# Patient Record
Sex: Male | Born: 1935 | Race: White | Hispanic: No | Smoking: Former smoker
Health system: Southern US, Community
[De-identification: ages and names within clinical notes are randomized; demographics above are authoritative.]

## PROBLEM LIST (undated history)

## (undated) DIAGNOSIS — E785 Hyperlipidemia, unspecified: Secondary | ICD-10-CM

## (undated) DIAGNOSIS — I2510 Atherosclerotic heart disease of native coronary artery without angina pectoris: Secondary | ICD-10-CM

## (undated) DIAGNOSIS — I639 Cerebral infarction, unspecified: Secondary | ICD-10-CM

## (undated) DIAGNOSIS — I10 Essential (primary) hypertension: Secondary | ICD-10-CM

## (undated) DIAGNOSIS — Z951 Presence of aortocoronary bypass graft: Secondary | ICD-10-CM

## (undated) HISTORY — DX: Cerebral infarction, unspecified: I63.9

## (undated) HISTORY — DX: Essential (primary) hypertension: I10

## (undated) HISTORY — DX: Hyperlipidemia, unspecified: E78.5

## (undated) HISTORY — DX: Presence of aortocoronary bypass graft: Z95.1

## (undated) HISTORY — DX: Atherosclerotic heart disease of native coronary artery without angina pectoris: I25.10

---

## 1996-09-19 DIAGNOSIS — Z951 Presence of aortocoronary bypass graft: Secondary | ICD-10-CM

## 1996-09-19 HISTORY — PX: CHOLECYSTECTOMY: SHX55

## 1996-09-19 HISTORY — DX: Presence of aortocoronary bypass graft: Z95.1

## 1997-07-08 HISTORY — PX: CORONARY ARTERY BYPASS GRAFT: SHX141

## 1998-03-30 ENCOUNTER — Ambulatory Visit (HOSPITAL_COMMUNITY): Admission: RE | Admit: 1998-03-30 | Discharge: 1998-03-30 | Payer: Self-pay | Admitting: Cardiology

## 2007-02-09 ENCOUNTER — Emergency Department (HOSPITAL_COMMUNITY): Admission: EM | Admit: 2007-02-09 | Discharge: 2007-02-09 | Payer: Self-pay | Admitting: Emergency Medicine

## 2012-11-22 ENCOUNTER — Other Ambulatory Visit (HOSPITAL_COMMUNITY): Payer: Self-pay | Admitting: Internal Medicine

## 2012-11-22 DIAGNOSIS — R9430 Abnormal result of cardiovascular function study, unspecified: Secondary | ICD-10-CM

## 2012-11-29 ENCOUNTER — Ambulatory Visit (HOSPITAL_COMMUNITY)
Admission: RE | Admit: 2012-11-29 | Discharge: 2012-11-29 | Disposition: A | Discharge status: Home / Self Care | Payer: BC Managed Care – PPO | Source: Ambulatory Visit | Attending: Internal Medicine | Admitting: Internal Medicine

## 2012-11-29 DIAGNOSIS — IMO0002 Reserved for concepts with insufficient information to code with codable children: Secondary | ICD-10-CM

## 2012-11-29 DIAGNOSIS — R0600 Dyspnea, unspecified: Secondary | ICD-10-CM

## 2012-11-29 DIAGNOSIS — Z951 Presence of aortocoronary bypass graft: Secondary | ICD-10-CM

## 2012-11-29 DIAGNOSIS — R0609 Other forms of dyspnea: Secondary | ICD-10-CM | POA: Insufficient documentation

## 2012-11-29 DIAGNOSIS — R0989 Other specified symptoms and signs involving the circulatory and respiratory systems: Secondary | ICD-10-CM | POA: Insufficient documentation

## 2012-11-29 HISTORY — PX: TRANSTHORACIC ECHOCARDIOGRAM: SHX275

## 2012-11-29 NOTE — Progress Notes (Signed)
2D Echo Performed 11/29/2012    Tammie Crouch, RCS  

## 2013-02-01 ENCOUNTER — Other Ambulatory Visit: Payer: Self-pay | Admitting: *Deleted

## 2013-02-01 MED ORDER — AMLODIPINE BESY-BENAZEPRIL HCL 5-20 MG PO CAPS: 1.0000 | ORAL_CAPSULE | Freq: Every day | ORAL | Status: DC

## 2013-02-01 NOTE — Telephone Encounter (Signed)
rx sent to pharmacy by e-script Pt last seen 12/10/12

## 2013-02-27 ENCOUNTER — Other Ambulatory Visit: Payer: Self-pay | Admitting: *Deleted

## 2013-02-28 ENCOUNTER — Other Ambulatory Visit: Payer: Self-pay

## 2013-02-28 MED ORDER — AMLODIPINE BESY-BENAZEPRIL HCL 5-20 MG PO CAPS: 1.0000 | ORAL_CAPSULE | Freq: Every day | ORAL | Status: DC

## 2013-02-28 NOTE — Telephone Encounter (Signed)
Rx was sent to pharmacy electronically. 

## 2013-09-28 ENCOUNTER — Other Ambulatory Visit: Payer: Self-pay | Admitting: Internal Medicine

## 2013-10-01 NOTE — Telephone Encounter (Signed)
Rx was sent to pharmacy electronically. 

## 2013-10-22 ENCOUNTER — Telehealth: Payer: Self-pay | Admitting: Internal Medicine

## 2013-10-22 NOTE — Telephone Encounter (Signed)
Pt wants to know if he needs blood test prior to his appt with dr. Rennis Goldenhilty in march?

## 2013-11-15 ENCOUNTER — Encounter: Payer: Self-pay | Admitting: *Deleted

## 2013-11-18 ENCOUNTER — Encounter: Payer: Self-pay | Admitting: Internal Medicine

## 2013-11-18 ENCOUNTER — Ambulatory Visit (INDEPENDENT_AMBULATORY_CARE_PROVIDER_SITE_OTHER): Payer: BC Managed Care – PPO | Admitting: Internal Medicine

## 2013-11-18 ENCOUNTER — Other Ambulatory Visit: Payer: Self-pay | Admitting: Internal Medicine

## 2013-11-18 VITALS — BP 110/64 | HR 77 | Ht 72.0 in | Wt 210.7 lb

## 2013-11-18 DIAGNOSIS — N183 Chronic kidney disease, stage 3 unspecified: Secondary | ICD-10-CM

## 2013-11-18 DIAGNOSIS — I255 Ischemic cardiomyopathy: Secondary | ICD-10-CM

## 2013-11-18 DIAGNOSIS — Z951 Presence of aortocoronary bypass graft: Secondary | ICD-10-CM

## 2013-11-18 DIAGNOSIS — I2510 Atherosclerotic heart disease of native coronary artery without angina pectoris: Secondary | ICD-10-CM

## 2013-11-18 DIAGNOSIS — I2589 Other forms of chronic ischemic heart disease: Secondary | ICD-10-CM

## 2013-11-18 DIAGNOSIS — N1832 Chronic kidney disease, stage 3b: Secondary | ICD-10-CM | POA: Insufficient documentation

## 2013-11-18 DIAGNOSIS — R7301 Impaired fasting glucose: Secondary | ICD-10-CM

## 2013-11-18 DIAGNOSIS — E785 Hyperlipidemia, unspecified: Secondary | ICD-10-CM

## 2013-11-18 MED ORDER — AMLODIPINE BESY-BENAZEPRIL HCL 5-20 MG PO CAPS: ORAL_CAPSULE | ORAL | Status: DC

## 2013-11-18 MED ORDER — METOPROLOL SUCCINATE ER 25 MG PO TB24: 12.5000 mg | ORAL_TABLET | Freq: Every day | ORAL | Status: DC

## 2013-11-18 MED ORDER — ATORVASTATIN CALCIUM 20 MG PO TABS: 20.0000 mg | ORAL_TABLET | Freq: Every day | ORAL | Status: DC

## 2013-11-18 NOTE — Telephone Encounter (Signed)
Medication were order early

## 2013-11-18 NOTE — Progress Notes (Signed)
OFFICE NOTE  Chief Complaint:  No complaints  Primary Care Physician: Tally DueGUEST, CHRIS WARREN, MD  HPI:  Patrick Giles is a 80 year old gentleman, previously followed by Dr. Clarene DukeLittle, with history of CABG in 1998 with a LIMA to LAD, saphenous vein graft to PDA, and vein graft to first diagonal. In 2006, he had a stress test, which showed an EF of about 43% to 44% with a fixed inferoapical defect. That was again present in 2010, suggesting he may have lost a vein graft, possibly to the PDA, and has a fixed defect which looks like scar. The recent, repeat echo in 2014 showed an EF of 45-50% with inferolateral hypokinesis. He is able to walk without any shortness of breath or worsening symptoms. He is on an ACE inhibitor and a B-blocker was added due to CAD and HTN.  He has tolerated this well.  He recently had a lipid profile showing total cholesterol 161, HDL 51, triglycerides 126 and LDL 96. Fasting glucose was 119. Creatinine is stable at 1.5.  PMHx:  Past Medical History  Diagnosis Date  . CAD (coronary artery disease)   . S/P CABG (coronary artery bypass graft) 1998    LIMA to LAD, VG to PDA, VG to first diagonal  . Hypertension   . CKD (chronic kidney disease)   . Dyslipidemia   . History of ischemic cardiomyopathy     Past Surgical History  Procedure Laterality Date  . Coronary artery bypass graft  07/08/1997    LIMA to LAD, reverse SVG to PDA, reverse SVG to first diagonal (Dr. Wynetta FinesE. Gerhardt)  . Cholecystectomy  1998  . Transthoracic echocardiogram  11/29/2012    EF 45-50%, grade 1 diastolic dysfunction  . Nm myocar perf wall motion  11/2008    bruce myoview; stress images show modeate in intensity perfusion defect in apical anterior, apical setpal, apical & septal walls on stress, resting images with no significant defect reversibility - abnormal, but low risk scan    FAMHx:  Family History  Problem Relation Age of Onset  . Emphysema Mother   . Colon cancer Father   . Lung  cancer Brother     SOCHx:   reports that he has never smoked. He has never used smokeless tobacco. He reports that he does not drink alcohol or use illicit drugs.  ALLERGIES:  No Known Allergies  ROS: A comprehensive review of systems was negative.  HOME MEDS: Current Outpatient Prescriptions  Medication Sig Dispense Refill  . amLODipine-benazepril (LOTREL) 5-20 MG per capsule TAKE 1 CAPSULE BY MOUTH DAILY.  30 capsule  11  . aspirin 81 MG tablet Take 81 mg by mouth daily.      Marland Kitchen. atorvastatin (LIPITOR) 20 MG tablet Take 1 tablet (20 mg total) by mouth daily.  30 tablet  11  . BEE POLLEN PO Take 550 mg by mouth daily.       Marland Kitchen. GARLIC PO Take 1,6101,000 mg by mouth daily.       . metoprolol succinate (TOPROL-XL) 25 MG 24 hr tablet Take 0.5 tablets (12.5 mg total) by mouth daily.  15 tablet  11  . Multiple Vitamins-Minerals (ALIVE MENS ENERGY PO) Take by mouth daily.      . Omega-3 Fatty Acids (FISH OIL) 1200 MG CAPS Take 2 capsules by mouth daily.       No current facility-administered medications for this visit.    LABS/IMAGING: No results found for this or any previous visit (from the past  48 hour(s)). No results found.  VITALS: BP 110/64  Pulse 77  Ht 6' (1.829 m)  Wt 210 lb 11.2 oz (95.573 kg)  BMI 28.57 kg/m2  EXAM: General appearance: alert and no distress Neck: no carotid bruit and no JVD Lungs: clear to auscultation bilaterally Heart: regular rate and rhythm, S1, S2 normal, no murmur, click, rub or gallop Abdomen: soft, non-tender; bowel sounds normal; no masses,  no organomegaly Extremities: extremities normal, atraumatic, no cyanosis or edema Pulses: 2+ and symmetric Skin: Skin color, texture, turgor normal. No rashes or lesions Neurologic: Grossly normal Psych: Mood, affect normal  EKG: Normal sinus rhythm at 77  ASSESSMENT: 1. Coronary artery disease status post three-vessel CABG in 1998 2. Ischemic cardio myopathy EF 45-50%, with inferolateral  hypokinesis 3. Hypertension-controlled 4. Dyslipidemia-at goal 5. CKD 3  PLAN: 1.   Mr. Shillingford is doing fairly well and is on a good medical regimen.  Blood pressure remains well controlled and she is cholesterol is at goal. Creatinine is stable at 1.5. He does have apparent fasting glucose and a metabolic syndrome profile. He needs to continue to work on reduction of abdominal obesity with exercise and dietary changes. Plan to see him back annually or sooner as necessary.  Chrystie Nose, MD, [facility] Attending Cardiologist [facility]  Roston Grunewald C 11/18/2013, 4:50 PM

## 2013-11-18 NOTE — Patient Instructions (Signed)
Your physician wants you to follow-up in: 1 year. You will receive a reminder letter in the mail two months in advance. If you don't receive a letter, please call our office to schedule the follow-up appointment.  

## 2013-11-19 ENCOUNTER — Encounter: Payer: Self-pay | Admitting: Internal Medicine

## 2014-01-25 ENCOUNTER — Other Ambulatory Visit: Payer: Self-pay | Admitting: Internal Medicine

## 2014-01-27 NOTE — Telephone Encounter (Signed)
Refill request refused - already refilled 11/18/13

## 2014-05-13 ENCOUNTER — Ambulatory Visit (INDEPENDENT_AMBULATORY_CARE_PROVIDER_SITE_OTHER): Payer: BC Managed Care – PPO | Admitting: Family Medicine

## 2014-05-13 VITALS — BP 120/72 | HR 62 | Temp 98.6°F | Resp 18 | Ht 69.0 in | Wt 208.0 lb

## 2014-05-13 DIAGNOSIS — Z1211 Encounter for screening for malignant neoplasm of colon: Secondary | ICD-10-CM

## 2014-05-13 DIAGNOSIS — R82998 Other abnormal findings in urine: Secondary | ICD-10-CM

## 2014-05-13 DIAGNOSIS — Z125 Encounter for screening for malignant neoplasm of prostate: Secondary | ICD-10-CM

## 2014-05-13 DIAGNOSIS — R319 Hematuria, unspecified: Secondary | ICD-10-CM

## 2014-05-13 LAB — POCT CBC
GRANULOCYTE PERCENT: 67.8 % (ref 37–80)
HEMATOCRIT: 45.7 % (ref 43.5–53.7)
Hemoglobin: 14.6 g/dL (ref 14.1–18.1)
Lymph, poc: 2 (ref 0.6–3.4)
MCH, POC: 29.3 pg (ref 27–31.2)
MCHC: 32 g/dL (ref 31.8–35.4)
MCV: 91.4 fL (ref 80–97)
MID (cbc): 0.8 (ref 0–0.9)
MPV: 8.2 fL (ref 0–99.8)
PLATELET COUNT, POC: 218 10*3/uL (ref 142–424)
POC Granulocyte: 5.9 (ref 2–6.9)
POC LYMPH PERCENT: 23.3 %L (ref 10–50)
POC MID %: 8.9 %M (ref 0–12)
RBC: 5 M/uL (ref 4.69–6.13)
RDW, POC: 14.2 %
WBC: 8.7 10*3/uL (ref 4.6–10.2)

## 2014-05-13 LAB — POCT URINALYSIS DIPSTICK
Glucose, UA: NEGATIVE
Ketones, UA: 15
Nitrite, UA: NEGATIVE
Protein, UA: >=300
Spec Grav, UA: 1.015
Urobilinogen, UA: 4
pH, UA: 5.5

## 2014-05-13 LAB — POCT UA - MICROSCOPIC ONLY

## 2014-05-13 LAB — IFOBT (OCCULT BLOOD): IMMUNOLOGICAL FECAL OCCULT BLOOD TEST: POSITIVE

## 2014-05-13 NOTE — Patient Instructions (Signed)
We are going to refer you to a urologist so they can look at your bladder.  You will get a phone call about your appt with [facility].  I will contact you with your lab results as soon as they are available.   If you have not heard from me in 2 weeks, please contact me.  The fastest way to get your results is to register for My Chart (see the instructions on the last page of this printout).

## 2014-05-13 NOTE — Progress Notes (Signed)
Subjective:    Patient ID: Patrick Giles, male    DOB: [birth year], 80 y.o.   MRN: [id number]  HPI Pt presents to clinic with blood in his urine for the last 3 days.  It is not changing.  He feels fine.  His urine stream is weak but that is not a change for him.  He urinates about every 2 hrs but that is also not abnormal for him.  He is having no abd pain or pressure and having no dysuria.  He has not had a change in his sexual partner. He is having no back pain other than slightly sore from playing golf the last 2 days.  He has never noticed blood in his urine before.  He otherwise feels fine.   He had a normal PSA in 3/15 through his work.  He has not had a prostate exam in the last several years.  He has never been a smoker.  Review of Systems  Constitutional: Negative for fever and chills.  Genitourinary: Positive for frequency (he urinates every 2 hrs which is normal for him), hematuria and difficulty urinating (no trouble getting stream started but it is weak). Negative for dysuria, urgency and decreased urine volume.  Musculoskeletal: Negative for back pain.       Objective:   Physical Exam  Vitals reviewed. Constitutional: He is oriented to person, place, and time. He appears well-developed and well-nourished.  HENT:  Head: Normocephalic and atraumatic.  Right Ear: External ear normal.  Left Ear: External ear normal.  Cardiovascular: Normal rate, regular rhythm and normal heart sounds.   No murmur heard. Pulmonary/Chest: Effort normal and breath sounds normal. He has no wheezes.  Abdominal: Soft. Bowel sounds are normal. There is no tenderness.  Genitourinary: Guaiac negative stool. Prostate is enlarged. Prostate is not tender.  Neurological: He is alert and oriented to person, place, and time.  Skin: Skin is warm and dry.  Psychiatric: He has a normal mood and affect. His behavior is normal. Judgment and thought content normal.   Results for orders placed in visit on  05/13/14  POCT URINALYSIS DIPSTICK      Result Value Ref Range   Color, UA dark red     Clarity, UA turbid     Glucose, UA neg     Bilirubin, UA large     Ketones, UA 15     Spec Grav, UA 1.015     Blood, UA large     pH, UA 5.5     Protein, UA >=300     Urobilinogen, UA 4.0     Nitrite, UA neg     Leukocytes, UA large (3+)    POCT UA - MICROSCOPIC ONLY      Result Value Ref Range   WBC, Ur, HPF, POC       RBC, urine, microscopic TNTC     Bacteria, U Microscopic       Mucus, UA       Epithelial cells, urine per micros       Crystals, Ur, HPF, POC       Casts, Ur, LPF, POC       Yeast, UA      IFOBT (OCCULT BLOOD)      Result Value Ref Range   IFOBT Negative    POCT CBC      Result Value Ref Range   WBC 8.7  4.6 - 10.2 K/uL   Lymph, poc 2.0  0.6 - 3.4  POC LYMPH PERCENT 23.3  10 - 50 %L   MID (cbc) 0.8  0 - 0.9   POC MID % 8.9  0 - 12 %M   POC Granulocyte 5.9  2 - 6.9   Granulocyte percent 67.8  37 - 80 %G   RBC 5.00  4.69 - 6.13 M/uL   Hemoglobin 14.6  14.1 - 18.1 g/dL   HCT, POC 13.0  86.5 - 53.7 %   MCV 91.4  80 - 97 fL   MCH, POC 29.3  27 - 31.2 pg   MCHC 32.0  31.8 - 35.4 g/dL   RDW, POC 78.4     Platelet Count, POC 218  142 - 424 K/uL   MPV 8.2  0 - 99.8 fL       Assessment & Plan:  Blood in urine - Plan: POCT urinalysis dipstick, POCT UA - Microscopic Only, POCT CBC  Hematuria - Plan: Ambulatory referral to Urology  Screening for prostate cancer - Plan: PSA  Screening for colon cancer - Plan: IFOBT POC (occult bld, rslt in office)  Urine white blood cells increased - Plan: Urine culture  D/w Dr Arva Chafe PA-C  [facility] 05/13/2014 10:32 AM

## 2014-05-14 LAB — URINE CULTURE
Colony Count: NO GROWTH
ORGANISM ID, BACTERIA: NO GROWTH

## 2014-05-14 LAB — PSA: PSA: 7.17 ng/mL — ABNORMAL HIGH (ref <=4.00)

## 2014-05-15 NOTE — Progress Notes (Signed)
Reviewed documentation and agree w/ assessment and plan. Eva Shaw, MD MPH 

## 2014-07-23 ENCOUNTER — Observation Stay (HOSPITAL_COMMUNITY)
Admission: EM | Admit: 2014-07-23 | Discharge: 2014-07-24 | Disposition: A | Discharge status: Home / Self Care | Payer: BC Managed Care – PPO | Attending: Family Medicine | Admitting: Family Medicine

## 2014-07-23 ENCOUNTER — Ambulatory Visit (HOSPITAL_COMMUNITY)
Admission: RE | Admit: 2014-07-23 | Discharge: 2014-07-23 | Disposition: A | Discharge status: Home / Self Care | Payer: BC Managed Care – PPO | Source: Ambulatory Visit | Attending: Family Medicine | Admitting: Family Medicine

## 2014-07-23 ENCOUNTER — Telehealth: Payer: Self-pay | Admitting: *Deleted

## 2014-07-23 ENCOUNTER — Encounter (HOSPITAL_COMMUNITY): Payer: Self-pay | Admitting: *Deleted

## 2014-07-23 ENCOUNTER — Ambulatory Visit (INDEPENDENT_AMBULATORY_CARE_PROVIDER_SITE_OTHER): Payer: BC Managed Care – PPO | Admitting: Family Medicine

## 2014-07-23 VITALS — BP 148/82 | HR 102 | Temp 98.5°F | Resp 18 | Ht 69.5 in | Wt 205.2 lb

## 2014-07-23 DIAGNOSIS — M7989 Other specified soft tissue disorders: Secondary | ICD-10-CM

## 2014-07-23 DIAGNOSIS — R319 Hematuria, unspecified: Secondary | ICD-10-CM | POA: Diagnosis not present

## 2014-07-23 DIAGNOSIS — M79604 Pain in right leg: Secondary | ICD-10-CM | POA: Diagnosis present

## 2014-07-23 DIAGNOSIS — E785 Hyperlipidemia, unspecified: Secondary | ICD-10-CM | POA: Diagnosis not present

## 2014-07-23 DIAGNOSIS — Z7982 Long term (current) use of aspirin: Secondary | ICD-10-CM | POA: Diagnosis not present

## 2014-07-23 DIAGNOSIS — I129 Hypertensive chronic kidney disease with stage 1 through stage 4 chronic kidney disease, or unspecified chronic kidney disease: Secondary | ICD-10-CM | POA: Diagnosis not present

## 2014-07-23 DIAGNOSIS — M79609 Pain in unspecified limb: Secondary | ICD-10-CM

## 2014-07-23 DIAGNOSIS — M79661 Pain in right lower leg: Secondary | ICD-10-CM

## 2014-07-23 DIAGNOSIS — N1832 Chronic kidney disease, stage 3b: Secondary | ICD-10-CM | POA: Diagnosis present

## 2014-07-23 DIAGNOSIS — N183 Chronic kidney disease, stage 3 unspecified: Secondary | ICD-10-CM | POA: Diagnosis present

## 2014-07-23 DIAGNOSIS — I824Z2 Acute embolism and thrombosis of unspecified deep veins of left distal lower extremity: Secondary | ICD-10-CM | POA: Insufficient documentation

## 2014-07-23 DIAGNOSIS — I2510 Atherosclerotic heart disease of native coronary artery without angina pectoris: Secondary | ICD-10-CM | POA: Diagnosis not present

## 2014-07-23 DIAGNOSIS — I82403 Acute embolism and thrombosis of unspecified deep veins of lower extremity, bilateral: Secondary | ICD-10-CM

## 2014-07-23 DIAGNOSIS — I82409 Acute embolism and thrombosis of unspecified deep veins of unspecified lower extremity: Secondary | ICD-10-CM

## 2014-07-23 DIAGNOSIS — Z951 Presence of aortocoronary bypass graft: Secondary | ICD-10-CM | POA: Diagnosis not present

## 2014-07-23 DIAGNOSIS — I82411 Acute embolism and thrombosis of right femoral vein: Principal | ICD-10-CM | POA: Insufficient documentation

## 2014-07-23 DIAGNOSIS — D649 Anemia, unspecified: Secondary | ICD-10-CM | POA: Diagnosis not present

## 2014-07-23 DIAGNOSIS — Z86718 Personal history of other venous thrombosis and embolism: Secondary | ICD-10-CM | POA: Diagnosis present

## 2014-07-23 DIAGNOSIS — I10 Essential (primary) hypertension: Secondary | ICD-10-CM | POA: Diagnosis present

## 2014-07-23 HISTORY — DX: Acute embolism and thrombosis of unspecified deep veins of unspecified lower extremity: I82.409

## 2014-07-23 LAB — CBC WITH DIFFERENTIAL/PLATELET
BASOS ABS: 0 10*3/uL (ref 0.0–0.1)
BASOS PCT: 0 % (ref 0–1)
EOS PCT: 0 % (ref 0–5)
Eosinophils Absolute: 0 10*3/uL (ref 0.0–0.7)
HEMATOCRIT: 38.3 % — AB (ref 39.0–52.0)
Hemoglobin: 12.9 g/dL — ABNORMAL LOW (ref 13.0–17.0)
Lymphocytes Relative: 10 % — ABNORMAL LOW (ref 12–46)
Lymphs Abs: 1.3 10*3/uL (ref 0.7–4.0)
MCH: 29.4 pg (ref 26.0–34.0)
MCHC: 33.7 g/dL (ref 30.0–36.0)
MCV: 87.2 fL (ref 78.0–100.0)
MONO ABS: 1.3 10*3/uL — AB (ref 0.1–1.0)
Monocytes Relative: 11 % (ref 3–12)
NEUTROS ABS: 9.6 10*3/uL — AB (ref 1.7–7.7)
Neutrophils Relative %: 79 % — ABNORMAL HIGH (ref 43–77)
Platelets: 180 10*3/uL (ref 150–400)
RBC: 4.39 MIL/uL (ref 4.22–5.81)
RDW: 12.8 % (ref 11.5–15.5)
WBC: 12.2 10*3/uL — ABNORMAL HIGH (ref 4.0–10.5)

## 2014-07-23 LAB — COMPREHENSIVE METABOLIC PANEL
ALBUMIN: 3.5 g/dL (ref 3.5–5.2)
ALT: 13 U/L (ref 0–53)
AST: 13 U/L (ref 0–37)
Alkaline Phosphatase: 67 U/L (ref 39–117)
Anion gap: 17 — ABNORMAL HIGH (ref 5–15)
BUN: 28 mg/dL — ABNORMAL HIGH (ref 6–23)
CALCIUM: 9.6 mg/dL (ref 8.4–10.5)
CHLORIDE: 101 meq/L (ref 96–112)
CO2: 21 mEq/L (ref 19–32)
CREATININE: 1.69 mg/dL — AB (ref 0.50–1.35)
GFR calc Af Amer: 43 mL/min — ABNORMAL LOW (ref >90)
GFR calc non Af Amer: 37 mL/min — ABNORMAL LOW (ref >90)
Glucose, Bld: 123 mg/dL — ABNORMAL HIGH (ref 70–99)
Potassium: 4.6 mEq/L (ref 3.7–5.3)
SODIUM: 139 meq/L (ref 137–147)
Total Bilirubin: 0.9 mg/dL (ref 0.3–1.2)
Total Protein: 7.6 g/dL (ref 6.0–8.3)

## 2014-07-23 LAB — POCT CBC
Granulocyte percent: 76.9 %G (ref 37–80)
HEMATOCRIT: 41.7 % — AB (ref 43.5–53.7)
Hemoglobin: 13.6 g/dL — AB (ref 14.1–18.1)
LYMPH, POC: 2 (ref 0.6–3.4)
MCH, POC: 29.6 pg (ref 27–31.2)
MCHC: 32.6 g/dL (ref 31.8–35.4)
MCV: 91 fL (ref 80–97)
MID (cbc): 1 — AB (ref 0–0.9)
MPV: 7.6 fL (ref 0–99.8)
POC GRANULOCYTE: 9.9 — AB (ref 2–6.9)
POC LYMPH PERCENT: 15.3 %L (ref 10–50)
POC MID %: 7.8 % (ref 0–12)
Platelet Count, POC: 234 10*3/uL (ref 142–424)
RBC: 4.59 M/uL — AB (ref 4.69–6.13)
RDW, POC: 13.2 %
WBC: 12.9 10*3/uL — AB (ref 4.6–10.2)

## 2014-07-23 LAB — PROTIME-INR
INR: 1.1 (ref 0.00–1.49)
Prothrombin Time: 14.3 seconds (ref 11.6–15.2)

## 2014-07-23 NOTE — ED Provider Notes (Signed)
CSN: [id number]     Arrival date & time 07/23/14  1847 History   First MD Initiated Contact with Patient 07/23/14 2147     Chief Complaint  Patient presents with  . DVT     (Consider location/radiation/quality/duration/timing/severity/associated sxs/prior Treatment) HPI Comments: Patient here complaining of leg pain which began 3 days ago after he played golf. Patient had prostate surgery about a month ago. Denies any chest pain or shortness of breath. Saw his doctor for similar symptoms to schedule an outpatient Doppler of his legs which shows bilateral lower extremity DVT. Denies any numbness to his feet. No prior history of DVT. Symptoms persistent and are worse with ambulation better with rest. No treatment use prior to arrival  The history is provided by the patient.    Past Medical History  Diagnosis Date  . CAD (coronary artery disease)   . S/P CABG (coronary artery bypass graft) 1998    LIMA to LAD, VG to PDA, VG to first diagonal  . Hypertension   . CKD (chronic kidney disease)   . Dyslipidemia   . History of ischemic cardiomyopathy    Past Surgical History  Procedure Laterality Date  . Coronary artery bypass graft  07/08/1997    LIMA to LAD, reverse SVG to PDA, reverse SVG to first diagonal (Dr. Wynetta FinesE. Gerhardt)  . Cholecystectomy  1998  . Transthoracic echocardiogram  11/29/2012    EF 45-50%, grade 1 diastolic dysfunction  . Nm myocar perf wall motion  11/2008    bruce myoview; stress images show modeate in intensity perfusion defect in apical anterior, apical setpal, apical & septal walls on stress, resting images with no significant defect reversibility - abnormal, but low risk scan   Family History  Problem Relation Age of Onset  . Emphysema Mother   . Colon cancer Father   . Lung cancer Brother    History  Substance Use Topics  . Smoking status: Never Smoker   . Smokeless tobacco: Never Used  . Alcohol Use: No    Review of Systems  All other systems reviewed  and are negative.     Allergies  Review of patient's allergies indicates no known allergies.  Home Medications   Prior to Admission medications   Medication Sig Start Date End Date Taking? Authorizing Provider  amLODipine-benazepril (LOTREL) 5-20 MG per capsule Take 1 capsule by mouth daily.   Yes Historical Provider, MD  aspirin 81 MG tablet Take 81 mg by mouth daily.   Yes Historical Provider, MD  atorvastatin (LIPITOR) 20 MG tablet Take 1 tablet (20 mg total) by mouth daily. 11/18/13  Yes Chrystie NoseKenneth C. Hilty, MD  BEE POLLEN PO Take 550 mg by mouth daily.    Yes Historical Provider, MD  GARLIC PO Take 4,0981,000 mg by mouth daily.    Yes Historical Provider, MD  metoprolol succinate (TOPROL-XL) 25 MG 24 hr tablet Take 0.5 tablets (12.5 mg total) by mouth daily. 11/18/13  Yes Chrystie NoseKenneth C. Hilty, MD  Multiple Vitamins-Minerals (ALIVE MENS ENERGY PO) Take 1 tablet by mouth daily.    Yes Historical Provider, MD  Omega-3 Fatty Acids (FISH OIL) 1200 MG CAPS Take 2 capsules by mouth daily.   Yes Historical Provider, MD  amLODipine-benazepril (LOTREL) 5-20 MG per capsule TAKE 1 CAPSULE BY MOUTH DAILY. Patient not taking: Reported on 07/23/2014 11/18/13   Chrystie NoseKenneth C. Hilty, MD   BP 142/69 mmHg  Pulse 93  Temp(Src) 98.9 F (37.2 C)  Resp 23  SpO2 95% Physical Exam  Constitutional: He is oriented to person, place, and time. He appears well-developed and well-nourished.  Non-toxic appearance. No distress.  HENT:  Head: Normocephalic and atraumatic.  Eyes: Conjunctivae, EOM and lids are normal. Pupils are equal, round, and reactive to light.  Neck: Normal range of motion. Neck supple. No tracheal deviation present. No thyroid mass present.  Cardiovascular: Regular rhythm and normal heart sounds.  Tachycardia present.  Exam reveals no gallop.   No murmur heard. Pulmonary/Chest: Effort normal and breath sounds normal. No stridor. No respiratory distress. He has no decreased breath sounds. He has no wheezes. He  has no rhonchi. He has no rales.  Abdominal: Soft. Normal appearance and bowel sounds are normal. He exhibits no distension. There is no tenderness. There is no rebound and no CVA tenderness.  Musculoskeletal: Normal range of motion. He exhibits no edema or tenderness.       Legs: Neurological: He is alert and oriented to person, place, and time. He has normal strength. No cranial nerve deficit or sensory deficit. GCS eye subscore is 4. GCS verbal subscore is 5. GCS motor subscore is 6.  Skin: Skin is warm and dry. No abrasion and no rash noted.  Psychiatric: He has a normal mood and affect. His speech is normal and behavior is normal.  Nursing note and vitals reviewed.   ED Course  Procedures (including critical care time) Labs Review Labs Reviewed  CBC WITH DIFFERENTIAL - Abnormal; Notable for the following:    WBC 12.2 (*)    Hemoglobin 12.9 (*)    HCT 38.3 (*)    Neutrophils Relative % 79 (*)    Neutro Abs 9.6 (*)    Lymphocytes Relative 10 (*)    Monocytes Absolute 1.3 (*)    All other components within normal limits  COMPREHENSIVE METABOLIC PANEL - Abnormal; Notable for the following:    Glucose, Bld 123 (*)    BUN 28 (*)    Creatinine, Ser 1.69 (*)    GFR calc non Af Amer 37 (*)    GFR calc Af Amer 43 (*)    Anion gap 17 (*)    All other components within normal limits  PROTIME-INR    Imaging Review No results found.   EKG Interpretation None      MDM   Final diagnoses:  None    Will start Lovenox per pharmacy and admitted to the medicine service    Toy BakerAnthony T Audley Hinojos, MD 07/23/14 2219

## 2014-07-23 NOTE — Telephone Encounter (Signed)
[facility] vascular lab called report R Venous Doppler. See report.Noreene LarssonJill kept patient at the vascular lab until further instructed by us next step.Dr. Patsy Lageropland notified of results and will page Noreene LarssonJill [phone] to discuss.

## 2014-07-23 NOTE — Progress Notes (Signed)
*  PRELIMINARY RESULTS* Vascular Ultrasound Lower extremity venous duplex has been completed.  Preliminary findings: Right = DVT involving the Right distal femoral vein, Right popliteal vein, Right gastroc veins, Right posterior tibial veins, and Right peroneal veins. Left = partial DVT involving the Right popliteal vein.   Called results to [place].    Farrel DemarkJill Eunice, RDMS, RVT  07/23/2014, 5:56 PM

## 2014-07-23 NOTE — Progress Notes (Signed)
[facility] [address], [place] [address] [phone]  Date:  07/23/2014   Name:  Patrick Giles   DOB:  [birth year]   MRN:  [id number]  PCP:  Tally DueGUEST, CHRIS WARREN, MD    Chief Complaint: Leg Swelling   History of Present Illness:  Patrick Giles is a 80 y.o. very pleasant male patient who presents with the following:  He is here today with a problem with his right lower leg.  It has been tender and swollen for the last 3 days- started Sunday, the same day he played golf.   It seems to be getting worst and he has significant pain and swelling.   He does not note any particular SOB He did have a prostate surgery about one month ago (?prostatectomy) He has never had a DVT in the past.    Patient Active Problem List   Diagnosis Date Noted  . CAD (coronary artery disease) 11/18/2013  . S/P CABG x 3 11/18/2013  . Cardiomyopathy, ischemic 11/18/2013  . Dyslipidemia 11/18/2013  . CKD (chronic kidney disease) stage 3, GFR 30-59 ml/min 11/18/2013  . Impaired fasting glucose 11/18/2013    Past Medical History  Diagnosis Date  . CAD (coronary artery disease)   . S/P CABG (coronary artery bypass graft) 1998    LIMA to LAD, VG to PDA, VG to first diagonal  . Hypertension   . CKD (chronic kidney disease)   . Dyslipidemia   . History of ischemic cardiomyopathy     Past Surgical History  Procedure Laterality Date  . Coronary artery bypass graft  07/08/1997    LIMA to LAD, reverse SVG to PDA, reverse SVG to first diagonal (Dr. Wynetta FinesE. Gerhardt)  . Cholecystectomy  1998  . Transthoracic echocardiogram  11/29/2012    EF 45-50%, grade 1 diastolic dysfunction  . Nm myocar perf wall motion  11/2008    bruce myoview; stress images show modeate in intensity perfusion defect in apical anterior, apical setpal, apical & septal walls on stress, resting images with no significant defect reversibility - abnormal, but low risk scan    History  Substance Use Topics  . Smoking  status: Never Smoker   . Smokeless tobacco: Never Used  . Alcohol Use: No    Family History  Problem Relation Age of Onset  . Emphysema Mother   . Colon cancer Father   . Lung cancer Brother     No Known Allergies  Medication list has been reviewed and updated.  Current Outpatient Prescriptions on File Prior to Visit  Medication Sig Dispense Refill  . amLODipine-benazepril (LOTREL) 5-20 MG per capsule TAKE 1 CAPSULE BY MOUTH DAILY. 30 capsule 11  . aspirin 81 MG tablet Take 81 mg by mouth daily.    Marland Kitchen. atorvastatin (LIPITOR) 20 MG tablet Take 1 tablet (20 mg total) by mouth daily. 30 tablet 11  . BEE POLLEN PO Take 550 mg by mouth daily.     Marland Kitchen. GARLIC PO Take 2,9561,000 mg by mouth daily.     . metoprolol succinate (TOPROL-XL) 25 MG 24 hr tablet Take 0.5 tablets (12.5 mg total) by mouth daily. 15 tablet 11  . Multiple Vitamins-Minerals (ALIVE MENS ENERGY PO) Take by mouth daily.    . Omega-3 Fatty Acids (FISH OIL) 1200 MG CAPS Take 2 capsules by mouth daily.     No current facility-administered medications on file prior to visit.    Review of Systems: As per HPI- otherwise negative.  Physical Examination: Filed Vitals:   07/23/14 1609  BP: 148/82  Pulse: 102  Temp: 98.5 F (36.9 C)  Resp: 18   Filed Vitals:   07/23/14 1609  Height: 5' 9.5" (1.765 m)  Weight: 205 lb 3.2 oz (93.078 kg)   Body mass index is 29.88 kg/(m^2). Ideal Body Weight: Weight in (lb) to have BMI = 25: 171.4  GEN: WDWN, NAD, Non-toxic, A & O x 3 HEENT: Atraumatic, Normocephalic. Neck supple. No masses, No LAD. Ears and Nose: No external deformity. CV: RRR, No M/G/R. No JVD. No thrill. No extra heart sounds. PULM: CTA B, no wheezes, crackles, rhonchi. No retractions. No resp. distress. No accessory muscle use. ABD: S, NT, ND EXTR: No c/c/e NEURO favoring right leg and using a cane PSYCH: Normally interactive. Conversant. Not depressed or anxious appearing.  Calm demeanor.  Right lower leg  measures 1in larger in diameter than left.  It is diffusely tender and swollen and also slightly warm.    Assessment and Plan: Pain of right lower leg - Plan: POCT CBC, Lower Extremity Venous Duplex Right, CANCELED: Lower Extremity Venous Duplex Right  Pain and swelling of right lower leg - Plan: POCT CBC, Lower Extremity Venous Duplex Right, CANCELED: Lower Extremity Venous Duplex Right  Received report that he has extensive clot in the right leg and some in the left as well.  He is also s/p recent prostate surgery.  Discussed with pt on phone and [facility].  Called and alerted charge nurse.   Signed Abbe AmsterdamJessica Copland, MD

## 2014-07-23 NOTE — ED Notes (Signed)
The pt has been sent here from his doctors office with a dvt in his rt leg and another one froming in nhislt leg.  He has had leg pain since Sunday.  He saw his doctor today and had a us  Here in this hospital  Earlier toiday

## 2014-07-23 NOTE — ED Notes (Signed)
Attempted to give report 

## 2014-07-23 NOTE — Patient Instructions (Signed)
[facility] register at admitting for U/S

## 2014-07-24 ENCOUNTER — Encounter (HOSPITAL_COMMUNITY): Payer: Self-pay | Admitting: Internal Medicine

## 2014-07-24 DIAGNOSIS — I82403 Acute embolism and thrombosis of unspecified deep veins of lower extremity, bilateral: Secondary | ICD-10-CM

## 2014-07-24 DIAGNOSIS — E785 Hyperlipidemia, unspecified: Secondary | ICD-10-CM | POA: Diagnosis not present

## 2014-07-24 DIAGNOSIS — I10 Essential (primary) hypertension: Secondary | ICD-10-CM

## 2014-07-24 DIAGNOSIS — Z86718 Personal history of other venous thrombosis and embolism: Secondary | ICD-10-CM | POA: Diagnosis present

## 2014-07-24 DIAGNOSIS — I2510 Atherosclerotic heart disease of native coronary artery without angina pectoris: Secondary | ICD-10-CM | POA: Diagnosis not present

## 2014-07-24 DIAGNOSIS — N183 Chronic kidney disease, stage 3 (moderate): Secondary | ICD-10-CM

## 2014-07-24 DIAGNOSIS — I82401 Acute embolism and thrombosis of unspecified deep veins of right lower extremity: Secondary | ICD-10-CM

## 2014-07-24 LAB — COMPREHENSIVE METABOLIC PANEL
ALBUMIN: 3 g/dL — AB (ref 3.5–5.2)
ALK PHOS: 60 U/L (ref 39–117)
ALT: 12 U/L (ref 0–53)
AST: 11 U/L (ref 0–37)
Anion gap: 15 (ref 5–15)
BILIRUBIN TOTAL: 0.8 mg/dL (ref 0.3–1.2)
BUN: 26 mg/dL — ABNORMAL HIGH (ref 6–23)
CHLORIDE: 102 meq/L (ref 96–112)
CO2: 22 mEq/L (ref 19–32)
Calcium: 9 mg/dL (ref 8.4–10.5)
Creatinine, Ser: 1.6 mg/dL — ABNORMAL HIGH (ref 0.50–1.35)
GFR calc Af Amer: 46 mL/min — ABNORMAL LOW (ref >90)
GFR calc non Af Amer: 39 mL/min — ABNORMAL LOW (ref >90)
Glucose, Bld: 122 mg/dL — ABNORMAL HIGH (ref 70–99)
POTASSIUM: 4.2 meq/L (ref 3.7–5.3)
Sodium: 139 mEq/L (ref 137–147)
Total Protein: 6.6 g/dL (ref 6.0–8.3)

## 2014-07-24 LAB — URINE MICROSCOPIC-ADD ON

## 2014-07-24 LAB — CBC WITH DIFFERENTIAL/PLATELET
BASOS ABS: 0 10*3/uL (ref 0.0–0.1)
BASOS PCT: 0 % (ref 0–1)
EOS PCT: 0 % (ref 0–5)
Eosinophils Absolute: 0 10*3/uL (ref 0.0–0.7)
HEMATOCRIT: 35.2 % — AB (ref 39.0–52.0)
Hemoglobin: 11.8 g/dL — ABNORMAL LOW (ref 13.0–17.0)
LYMPHS PCT: 17 % (ref 12–46)
Lymphs Abs: 1.9 10*3/uL (ref 0.7–4.0)
MCH: 30.1 pg (ref 26.0–34.0)
MCHC: 33.5 g/dL (ref 30.0–36.0)
MCV: 89.8 fL (ref 78.0–100.0)
Monocytes Absolute: 1.8 10*3/uL — ABNORMAL HIGH (ref 0.1–1.0)
Monocytes Relative: 16 % — ABNORMAL HIGH (ref 3–12)
Neutro Abs: 7.2 10*3/uL (ref 1.7–7.7)
Neutrophils Relative %: 67 % (ref 43–77)
Platelets: 178 10*3/uL (ref 150–400)
RBC: 3.92 MIL/uL — ABNORMAL LOW (ref 4.22–5.81)
RDW: 13.1 % (ref 11.5–15.5)
WBC: 10.9 10*3/uL — ABNORMAL HIGH (ref 4.0–10.5)

## 2014-07-24 LAB — URINALYSIS, ROUTINE W REFLEX MICROSCOPIC
Bilirubin Urine: NEGATIVE
Glucose, UA: NEGATIVE mg/dL
Ketones, ur: 15 mg/dL — AB
Nitrite: NEGATIVE
PH: 5.5 (ref 5.0–8.0)
Protein, ur: 30 mg/dL — AB
Specific Gravity, Urine: 1.019 (ref 1.005–1.030)
Urobilinogen, UA: 0.2 mg/dL (ref 0.0–1.0)

## 2014-07-24 MED ORDER — ACETAMINOPHEN 650 MG RE SUPP: 650.0000 mg | Freq: Four times a day (QID) | RECTAL | Status: DC | PRN

## 2014-07-24 MED ORDER — APIXABAN 5 MG PO TABS
10.0000 mg | ORAL_TABLET | Freq: Two times a day (BID) | ORAL | Status: DC
  Administered 2014-07-24: 10 mg via ORAL
  Filled 2014-07-24 (×2): qty 2

## 2014-07-24 MED ORDER — ENOXAPARIN SODIUM 100 MG/ML ~~LOC~~ SOLN
100.0000 mg | Freq: Two times a day (BID) | SUBCUTANEOUS | Status: DC
  Administered 2014-07-24: 100 mg via SUBCUTANEOUS
  Filled 2014-07-24 (×3): qty 1

## 2014-07-24 MED ORDER — AMLODIPINE BESYLATE 5 MG PO TABS
5.0000 mg | ORAL_TABLET | Freq: Every day | ORAL | Status: DC
  Administered 2014-07-24: 5 mg via ORAL
  Filled 2014-07-24: qty 1

## 2014-07-24 MED ORDER — ONDANSETRON HCL 4 MG PO TABS: 4.0000 mg | ORAL_TABLET | Freq: Four times a day (QID) | ORAL | Status: DC | PRN

## 2014-07-24 MED ORDER — OMEGA-3-ACID ETHYL ESTERS 1 G PO CAPS
2000.0000 mg | ORAL_CAPSULE | Freq: Every day | ORAL | Status: DC
  Administered 2014-07-24: 2000 mg via ORAL
  Filled 2014-07-24: qty 2

## 2014-07-24 MED ORDER — ASPIRIN EC 81 MG PO TBEC
81.0000 mg | DELAYED_RELEASE_TABLET | Freq: Every day | ORAL | Status: DC
  Administered 2014-07-24: 81 mg via ORAL
  Filled 2014-07-24: qty 1

## 2014-07-24 MED ORDER — ONDANSETRON HCL 4 MG/2ML IJ SOLN: 4.0000 mg | Freq: Four times a day (QID) | INTRAMUSCULAR | Status: DC | PRN

## 2014-07-24 MED ORDER — APIXABAN 5 MG PO TABS: ORAL_TABLET | ORAL | Status: DC

## 2014-07-24 MED ORDER — BENAZEPRIL HCL 20 MG PO TABS
20.0000 mg | ORAL_TABLET | Freq: Every day | ORAL | Status: DC
  Administered 2014-07-24: 20 mg via ORAL
  Filled 2014-07-24: qty 1

## 2014-07-24 MED ORDER — ACETAMINOPHEN 325 MG PO TABS: 650.0000 mg | ORAL_TABLET | Freq: Four times a day (QID) | ORAL | Status: DC | PRN

## 2014-07-24 MED ORDER — MORPHINE SULFATE 2 MG/ML IJ SOLN
1.0000 mg | INTRAMUSCULAR | Status: DC | PRN
  Administered 2014-07-24: 1 mg via INTRAVENOUS
  Filled 2014-07-24: qty 1

## 2014-07-24 MED ORDER — AMLODIPINE BESY-BENAZEPRIL HCL 5-20 MG PO CAPS: 1.0000 | ORAL_CAPSULE | Freq: Every day | ORAL | Status: DC

## 2014-07-24 MED ORDER — SODIUM CHLORIDE 0.9 % IV SOLN
INTRAVENOUS | Status: DC
Start: 2014-07-24 — End: 2014-07-24
  Administered 2014-07-24: 01:00:00 via INTRAVENOUS

## 2014-07-24 MED ORDER — METOPROLOL SUCCINATE 12.5 MG HALF TABLET
12.5000 mg | ORAL_TABLET | Freq: Every day | ORAL | Status: DC
  Administered 2014-07-24: 12.5 mg via ORAL
  Filled 2014-07-24: qty 1

## 2014-07-24 MED ORDER — ATORVASTATIN CALCIUM 20 MG PO TABS
20.0000 mg | ORAL_TABLET | Freq: Every day | ORAL | Status: DC
  Administered 2014-07-24: 20 mg via ORAL
  Filled 2014-07-24: qty 1

## 2014-07-24 NOTE — Plan of Care (Signed)
Patient reports that he is an established patient of [facility] Robert [facility]ellowhris [facility] and [facility]asc [facility] when found to have DVT. I have discussed with Family Practice teaching service resident and they will assume care. Attending will be Dr Terri PiedraSarah Neal.    RAI,RIPUDEEP M.D. [facility] 07/24/2014, 8:01 AM  Pager: [phone]

## 2014-07-24 NOTE — Progress Notes (Signed)
New Admission Note:   Arrival Method: Via stretcher from the ED with an EMT at bedside  Mental Orientation: Alert and Oriented X4 Telemetry: No orders Assessment: Completed Skin: Warm, dry and intact  IV: Clean, dry and intact. NS @ 2910mL/hr Pain: Stated he is a 8 out of 10 with it being a dull ache in the right leg Tubes: N/A Safety Measures: Safety Fall Prevention Plan has been given, discussed and signed Admission: Completed [facility] [place] Orientation: Patient has been orientated to the room, unit and staff.  Family: Wife at bedside   Orders have been reviewed and implemented. Will continue to monitor the patient. Call light has been placed within reach and bed alarm has been activated.   [employer] Varcoyanne Hill BSN, RN  Phone number: [phone]

## 2014-07-24 NOTE — Discharge Summary (Signed)
Family Medicine [facility] Discharge Summary  Patient name: Patrick Giles Medical record number: [id number] Date of birth: 04/07/1933 Age: 80 y.o. Gender: male Date of Admission: 07/23/2014  Date of Discharge: 07/24/2014 Admitting Physician: Eduard ClosArshad N Kakrakandy, MD  Primary Care Provider: Tally DueGUEST, CHRIS WARREN, MD Consultants: noen  Indication for Hospitalization: Acute DVT  Discharge Diagnoses/Problem List:  Acute lower extremity DVT Anemia CAD status post CABG Hypertension Hyperlipidemia CKD 3  Disposition: Home  Discharge Condition: Stable, improved  Discharge Exam:   Blood pressure 136/69, pulse 87, temperature 98.4 F (36.9 C), temperature source Oral, resp. rate 18, height 5' 9.5" (1.765 m), weight 206 lb 6.4 oz (93.622 kg), SpO2 93 %.  Gen: NAD, alert, cooperative with exam HEENT: NCAT, EOMI CV: RRR, good S1/S2, no murmur Resp: CTABL, no wheezes, non-labored Abd: SNTND, BS present, no guarding or organomegaly Ext: right lower extremity with 1+ pitting edema, erythema, and warmth. No tenderness to palpation. Left lower extremity no edema. - 2+ dorsalis pedis pulses, no tenderness to palpation, warm, brisk capillary refill Neuro: Alert and oriented, No gross deficits   Brief Hospital Course:  [name] is a 80 year old male admitted to [facility] hospitalist service with an acute right lower extremity DVT. He is a significant past history of CAD status post CABG, HTN, HLD, CKD stage III.he was transferred to the family medicine teaching service on the morning of admission.  On examination the patient states that he's feeling well and would like to go home today. We discussed the risks and benefits of anticoagulation therapy and he decided to try a Eliquis. We discussed his anemia and he denied any sources of bleeding including blood in the stool or nosebleeds. He states that he can go get a follow-up CBC in 2 days at his PCPs office. We discussed at length reasons  to seek emergency care including blood in the stool or other areas of bleeding, fever, intolerance of by mouth intake, or trauma to the head.  He states that he is  Breathing easily, denies chest pain, denies dysuria or back/abdominal pain. He states that his right lower extremity pain is well controlled without medication at this time.  Right and left lower extremity DVT - seemingly unprovoked, no prolonged immobility, however he did have prostate surgery approximately one month ago, denies any colorectal cancer screening - Colon cancer screening not clearly indicated after the age of 80 - Started on Lovenox, transition to a Eliquis prior to discharge  - Due to renal function he should decrease his dose to 2.5 mg twice a day when he turns 80 years old. - no signs of compartment syndrome this morning  Anemia - hemoglobin dropped from 12.9-10.9 overnight - no clear source of bleeding, does have mild hematuria - Discussed risk and benefits of additional night in the hospital versus follow-up CBC in 2 days and he would like to be discharged with close follow-up. - Discussed in detail reasons to return for care including signs of bleeding or  Pain that cannot be controlled with only Tylenol in his leg.  CAD status post CABG - continue metoprolol, aspirin, Lipitor, ACE inhibitor - no chest pain duringMission  HTN - well-controlled throughout admission - continue Norvasc and ACE inhibitor  HLD - continue atorvastatin  CKD 3 - creatinine 1.6 with GFR of 37 - reviewed Eliquis dosing with pharmacy, full dose at current level however he would warrant decreasing dose to 2.5 mg twice a day when he turns 82 or if he  were to dip below 60 kg of weight - Continue ace  Issues for Follow Up:  - please consider follow-up CBC when he sees you in 2 days - Closely monitor renal function with use of Eliquis, when he turns 80 years old if he continues the medication he should go down to 2.5 mg twice a  day - Consider screening for colon cancer given seemingly unprovoked acute DVT.  Significant Procedures: none  Significant Labs and Imaging:   Recent Labs Lab 07/23/14 1648 07/23/14 1917 07/24/14 0615  WBC 12.9* 12.2* 10.9*  HGB 13.6* 12.9* 11.8*  HCT 41.7* 38.3* 35.2*  PLT  --  180 178    Recent Labs Lab 07/23/14 1917 07/24/14 0615  NA 139 139  K 4.6 4.2  CL 101 102  CO2 21 22  GLUCOSE 123* 122*  BUN 28* 26*  CREATININE 1.69* 1.60*  CALCIUM 9.6 9.0  ALKPHOS 67 60  AST 13 11  ALT 13 12  ALBUMIN 3.5 3.0*   Bilateral lower extremity Doppler on 07/23/2014 shows extensive right DVT involving the right distal femoral vein, right popliteal vein, right gastroc veins, right posterior tibial veins, and right peroneal veins. Also left partial DVT involving the right popliteal vein  Results/Tests Pending at Time of Discharge: none  Discharge Medications:    Medication List    TAKE these medications        ALIVE MENS ENERGY PO  Take 1 tablet by mouth daily.     amLODipine-benazepril 5-20 MG per capsule  Commonly known as:  LOTREL  Take 1 capsule by mouth daily.     amLODipine-benazepril 5-20 MG per capsule  Commonly known as:  LOTREL  TAKE 1 CAPSULE BY MOUTH DAILY.     apixaban 5 MG Tabs tablet  Commonly known as:  ELIQUIS  Take 2 tabs twice daily for 1 week then take 1 tab twice daily     aspirin 81 MG tablet  Take 81 mg by mouth daily.     atorvastatin 20 MG tablet  Commonly known as:  LIPITOR  Take 1 tablet (20 mg total) by mouth daily.     BEE POLLEN PO  Take 550 mg by mouth daily.     Fish Oil 1200 MG Caps  Take 2 capsules by mouth daily.     GARLIC PO  Take 1,000 mg by mouth daily.     metoprolol succinate 25 MG 24 hr tablet  Commonly known as:  TOPROL-XL  Take 0.5 tablets (12.5 mg total) by mouth daily.        Discharge Instructions: Please refer to Patient Instructions section of EMR for full details.  Patient was counseled important signs  and symptoms that should prompt return to medical care, changes in medications, dietary instructions, activity restrictions, and follow up appointments.   Follow-Up Appointments: Follow-up Information    Follow up with GUEST, Loretha StaplerHRIS WARREN, MD. Schedule an appointment as soon as possible for a visit in 2 days.   Specialty:  Internal Medicine   Contact information:   [address] [place] [id number] [phone]       Elenora GammaSamuel L Bowdy Bair, MD 07/24/2014, 12:35 PM PGY-3, [facility]

## 2014-07-24 NOTE — Discharge Instructions (Signed)
It was great to meet you!  You should take 2 pills of eliquis twice daily for 1 week and then 1 pill twice daily for at least 3 months.   Be sure to follow up with your doctor in the next week, call them for an appt.   On your medication list there is two different entries for the same blood pressure medicine, you should only take 1 of these pills just like you usually do.    Deep Vein Thrombosis A deep vein thrombosis (DVT) is a blood clot that develops in the deep, larger veins of the leg, arm, or pelvis. These are more dangerous than clots that might form in veins near the surface of the body. A DVT can lead to serious and even life-threatening complications if the clot breaks off and travels in the bloodstream to the lungs.  A DVT can damage the valves in your leg veins so that instead of flowing upward, the blood pools in the lower leg. This is called post-thrombotic syndrome, and it can result in pain, swelling, discoloration, and sores on the leg. CAUSES Usually, several things contribute to the formation of blood clots. Contributing factors include:  The flow of blood slows down.  The inside of the vein is damaged in some way.  You have a condition that makes blood clot more easily. RISK FACTORS Some people are more likely than others to develop blood clots. Risk factors include:   Smoking.  Being overweight (obese).  Sitting or lying still for a long time. This includes long-distance travel, paralysis, or recovery from an illness or surgery. Other factors that increase risk are:   Older age, especially over 78 years of age.  Having a family history of blood clots or if you have already had a blot clot.  Having major or lengthy surgery. This is especially true for surgery on the hip, knee, or belly (abdomen). Hip surgery is particularly high risk.  Having a long, thin tube (catheter) placed inside a vein during a medical procedure.  Breaking a hip or leg.  Having  cancer or cancer treatment.  Pregnancy and childbirth.  Hormone changes make the blood clot more easily during pregnancy.  The fetus puts pressure on the veins of the pelvis.  There is a risk of injury to veins during delivery or a caesarean delivery. The risk is highest just after childbirth.  Medicines containing the male hormone estrogen. This includes birth control pills and hormone replacement therapy.  Other circulation or heart problems.  SIGNS AND SYMPTOMS When a clot forms, it can either partially or totally block the blood flow in that vein. Symptoms of a DVT can include:  Swelling of the leg or arm, especially if one side is much worse.  Warmth and redness of the leg or arm, especially if one side is much worse.  Pain in an arm or leg. If the clot is in the leg, symptoms may be more noticeable or worse when standing or walking. The symptoms of a DVT that has traveled to the lungs (pulmonary embolism, PE) usually start suddenly and include:  Shortness of breath.  Coughing.  Coughing up blood or blood-tinged mucus.  Chest pain. The chest pain is often worse with deep breaths.  Rapid heartbeat. Anyone with these symptoms should get emergency medical treatment right away. Do not wait to see if the symptoms will go away. Call your local emergency services (911 in the [place] if you have these symptoms. Do not drive  yourself to the hospital. DIAGNOSIS If a DVT is suspected, your health care provider will take a full medical history and perform a physical exam. Tests that also may be required include:  Blood tests, including studies of the clotting properties of the blood.  Ultrasound to see if you have clots in your legs or lungs.  X-rays to show the flow of blood when dye is injected into the veins (venogram).  Studies of your lungs if you have any chest symptoms. PREVENTION  Exercise the legs regularly. Take a brisk 30-minute walk every day.  Maintain a weight  that is appropriate for your height.  Avoid sitting or lying in bed for long periods of time without moving your legs.  Women, particularly those over the age of 35 years, should consider the risks and benefits of taking estrogen medicines, including birth control pills.  Do not smoke, especially if you take estrogen medicines.  Long-distance travel can increase your risk of DVT. You should exercise your legs by walking or pumping the muscles every hour.  Many of the risk factors above relate to situations that exist with hospitalization, either for illness, injury, or elective surgery. Prevention may include medical and nonmedical measures.  Your health care provider will assess you for the need for venous thromboembolism prevention when you are admitted to the hospital. If you are having surgery, your surgeon will assess you the day of or day after surgery. TREATMENT Once identified, a DVT can be treated. It can also be prevented in some circumstances. Once you have had a DVT, you may be at increased risk for a DVT in the future. The most common treatment for DVT is blood-thinning (anticoagulant) medicine, which reduces the blood's tendency to clot. Anticoagulants can stop new blood clots from forming and stop old clots from growing. They cannot dissolve existing clots. Your body does this by itself over time. Anticoagulants can be given by mouth, through an IV tube, or by injection. Your health care provider will determine the best program for you. Other medicines or treatments that may be used are:  Heparin or related medicines (low molecular weight heparin) are often the first treatment for a blood clot. They act quickly. However, they cannot be taken orally and must be given either in shot form or by IV tube.  Heparin can cause a fall in a component of blood that stops bleeding and forms blood clots (platelets). You will be monitored with blood tests to be sure this does not  occur.  Warfarin is an anticoagulant that can be swallowed. It takes a few days to start working, so usually heparin or related medicines are used in combination. Once warfarin is working, heparin is usually stopped.  Factor Xa inhibitor medicines, such as rivaroxaban and apixaban, also reduce blood clotting. These medicines are taken orally and can often be used without heparin or related medicines.  Less commonly, clot dissolving drugs (thrombolytics) are used to dissolve a DVT. They carry a high risk of bleeding, so they are used mainly in severe cases where your life or a part of your body is threatened.  Very rarely, a blood clot in the leg needs to be removed surgically.  If you are unable to take anticoagulants, your health care provider may arrange for you to have a filter placed in a main vein in your abdomen. This filter prevents clots from traveling to your lungs. HOME CARE INSTRUCTIONS  Take all medicines as directed by your health care provider.  Learn as much as you can about DVT.  Wear a medical alert bracelet or carry a medical alert card.  Ask your health care provider how soon you can go back to normal activities. It is important to stay active to prevent blood clots. If you are on anticoagulant medicine, avoid contact sports.  It is very important to exercise. This is especially important while traveling, sitting, or standing for long periods of time. Exercise your legs by walking or by tightening and relaxing your leg muscles regularly. Take frequent walks.  You may need to wear compression stockings. These are tight elastic stockings that apply pressure to the lower legs. This pressure can help keep the blood in the legs from clotting. Taking Warfarin Warfarin is a daily medicine that is taken by mouth. Your health care provider will advise you on the length of treatment (usually 3-6 months, sometimes lifelong). If you take warfarin:  Understand how to take warfarin  and foods that can affect how warfarin works in Public relations account executive.  Too much and too little warfarin are both dangerous. Too much warfarin increases the risk of bleeding. Too little warfarin continues to allow the risk for blood clots. Warfarin and Regular Blood Testing While taking warfarin, you will need to have regular blood tests to measure your blood clotting time. These blood tests usually include both the prothrombin time (PT) and international normalized ratio (INR) tests. The PT and INR results allow your health care provider to adjust your dose of warfarin. It is very important that you have your PT and INR tested as often as directed by your health care provider.  Warfarin and Your Diet Avoid major changes in your diet, or notify your health care provider before changing your diet. Arrange a visit with a registered dietitian to answer your questions. Many foods, especially foods high in vitamin K, can interfere with warfarin and affect the PT and INR results. You should eat a consistent amount of foods high in vitamin K. Foods high in vitamin K include:   Spinach, kale, broccoli, cabbage, collard and turnip greens, Brussels sprouts, peas, cauliflower, seaweed, and parsley.  Beef and pork liver.  Green tea.  Soybean oil. Warfarin with Other Medicines Many medicines can interfere with warfarin and affect the PT and INR results. You must:  Tell your health care provider about any and all medicines, vitamins, and supplements you take, including aspirin and other over-the-counter anti-inflammatory medicines. Be especially cautious with aspirin and anti-inflammatory medicines. Ask your health care provider before taking these.  Do not take or discontinue any prescribed or over-the-counter medicine except on the advice of your health care provider or pharmacist. Warfarin Side Effects Warfarin can have side effects, such as easy bruising and difficulty stopping bleeding. Ask your health care  provider or pharmacist about other side effects of warfarin. You will need to:  Hold pressure over cuts for longer than usual.  Notify your dentist and other health care providers that you are taking warfarin before you undergo any procedures where bleeding may occur. Warfarin with Alcohol and Tobacco   Drinking alcohol frequently can increase the effect of warfarin, leading to excess bleeding. It is best to avoid alcoholic drinks or to consume only very small amounts while taking warfarin. Notify your health care provider if you change your alcohol intake.   Do not use any tobacco products including cigarettes, chewing tobacco, or electronic cigarettes. If you smoke, quit. Ask your health care provider for help with quitting smoking. Alternative  Medicines to Warfarin: Factor Xa Inhibitor Medicines  These blood-thinning medicines are taken by mouth, usually for several weeks or longer. It is important to take the medicine every single day at the same time each day.  There are no regular blood tests required when using these medicines.  There are fewer food and drug interactions than with warfarin.  The side effects of this class of medicine are similar to those of warfarin, including excessive bruising or bleeding. Ask your health care provider or pharmacist about other potential side effects. SEEK MEDICAL CARE IF:  You notice a rapid heartbeat.  You feel weaker or more tired than usual.  You feel faint.  You notice increased bruising.  You feel your symptoms are not getting better in the time expected.  You believe you are having side effects of medicine. SEEK IMMEDIATE MEDICAL CARE IF:  You have chest pain.  You have trouble breathing.  You have new or increased swelling or pain in one leg.  You cough up blood.  You notice blood in vomit, in a bowel movement, or in urine. MAKE SURE YOU:  Understand these instructions.  Will watch your condition.  Will get help  right away if you are not doing well or get worse. Document Released: 09/05/2005 Document Revised: 01/20/2014 Document Reviewed: 05/13/2013 [facility] Patient Information 2015 [place], [place]. This information is not intended to replace advice given to you by your health care provider. Make sure you discuss any questions you have with your health care provider. Information on my medicine - ELIQUIS (apixaban)  Why was Eliquis prescribed for you? Eliquis was prescribed to treat blood clots that may have been found in the veins of your legs (deep vein thrombosis) or in your lungs (pulmonary embolism) and to reduce the risk of them occurring again.  What do You need to know about Eliquis ? The starting dose is 10 mg (two 5 mg tablets) taken TWICE daily for the FIRST SEVEN (7) DAYS, then on Friday 08/01/2014  the dose is reduced to ONE 5 mg tablet taken TWICE daily.  Eliquis may be taken with or without food.   Try to take the dose about the same time in the morning and in the evening. If you have difficulty swallowing the tablet whole please discuss with your pharmacist how to take the medication safely.  Take Eliquis exactly as prescribed and DO NOT stop taking Eliquis without talking to the doctor who prescribed the medication.  Stopping may increase your risk of developing a new blood clot.  Refill your prescription before you run out.  After discharge, you should have regular check-up appointments with your healthcare provider that is prescribing your Eliquis.    What do you do if you miss a dose? If a dose of ELIQUIS is not taken at the scheduled time, take it as soon as possible on the same day and twice-daily administration should be resumed. The dose should not be doubled to make up for a missed dose.  Important Safety Information A possible side effect of Eliquis is bleeding. You should call your healthcare provider right away if you experience any of the following: ? Bleeding from  an injury or your nose that does not stop. ? Unusual colored urine (red or dark brown) or unusual colored stools (red or black). ? Unusual bruising for unknown reasons. ? A serious fall or if you hit your head (even if there is no bleeding).  Some medicines may interact with Eliquis and might  increase your risk of bleeding or clotting while on Eliquis. To help avoid this, consult your healthcare provider or pharmacist prior to using any new prescription or non-prescription medications, including herbals, vitamins, non-steroidal anti-inflammatory drugs (NSAIDs) and supplements.  This website has more information on Eliquis (apixaban): http://www.eliquis.com/eliquis/home

## 2014-07-24 NOTE — Progress Notes (Signed)
ANTICOAGULATION CONSULT NOTE - Initial Consult  Pharmacy Consult for lovenox  Indication: DVT  No Known Allergies  Patient Measurements: Height: 5' 9.5" (176.5 cm) Weight: 206 lb 6.4 oz (93.622 kg) IBW/kg (Calculated) : 71.85 Heparin Dosing Weight:   Vital Signs: Temp: 100 F (37.8 C) (11/04 2350) Temp Source: Oral (11/04 2350) BP: 137/64 mmHg (11/04 2350) Pulse Rate: 87 (11/04 2350)  Labs:  Recent Labs  07/23/14 1648 07/23/14 1917  HGB 13.6* 12.9*  HCT 41.7* 38.3*  PLT  --  180  LABPROT  --  14.3  INR  --  1.10  CREATININE  --  1.69*    Estimated Creatinine Clearance: 40.4 mL/min (by C-G formula based on Cr of 1.69).   Medical History: Past Medical History  Diagnosis Date  . CAD (coronary artery disease)   . S/P CABG (coronary artery bypass graft) 1998    LIMA to LAD, VG to PDA, VG to first diagonal  . Hypertension   . CKD (chronic kidney disease)   . Dyslipidemia   . History of ischemic cardiomyopathy     Medications:  Prescriptions prior to admission  Medication Sig Dispense Refill Last Dose  . amLODipine-benazepril (LOTREL) 5-20 MG per capsule Take 1 capsule by mouth daily.     Marland Kitchen. aspirin 81 MG tablet Take 81 mg by mouth daily.   07/23/2014 at Unknown time  . atorvastatin (LIPITOR) 20 MG tablet Take 1 tablet (20 mg total) by mouth daily. 30 tablet 11 07/23/2014 at Unknown time  . BEE POLLEN PO Take 550 mg by mouth daily.    07/23/2014 at Unknown time  . GARLIC PO Take 1,6101,000 mg by mouth daily.    07/23/2014 at Unknown time  . metoprolol succinate (TOPROL-XL) 25 MG 24 hr tablet Take 0.5 tablets (12.5 mg total) by mouth daily. 15 tablet 11 07/23/2014 at 0730  . Multiple Vitamins-Minerals (ALIVE MENS ENERGY PO) Take 1 tablet by mouth daily.    07/23/2014 at Unknown time  . Omega-3 Fatty Acids (FISH OIL) 1200 MG CAPS Take 2 capsules by mouth daily.   07/23/2014 at Unknown time  . amLODipine-benazepril (LOTREL) 5-20 MG per capsule TAKE 1 CAPSULE BY MOUTH DAILY.  (Patient not taking: Reported on 07/23/2014) 30 capsule 11 Taking    Assessment: 80 yo male complains of leg pain beginning 3 days ago after playing golf. Prostate surgery x1 month ago. Doppler reveals bilater dvt's with no cp or sob. lovenox for anticoagulation. Stage 3 CRI with current crcl of 40 ml/min.  Goal of Therapy:  Anti-Xa level 0.6-1 units/ml 4hrs after LMWH dose given Monitor platelets by anticoagulation protocol: Yes   Plan:  lovenox 100 mg q12h  Cbc q72 hrs Monitor renal function and ss of bleeding.  Janice CoffinEarl, Sumayyah Custodio Jonathan 07/24/2014,12:21 AM

## 2014-07-24 NOTE — H&P (Addendum)
[facility] History and Physical  [name]:[id number] DOB: 09/17/1933 DOA: 07/23/2014  Referring physician: ER physician. PCP: Patrick Due, MD   Chief Complaint: Right lower extremity swelling and pain.  HPI: Patrick Giles is a 80 y.o. male history of CAD status post CABG, chronic kidney disease, hypertension and hyperlipidemia started experiencing swelling of the right lower extremity over the last 2 days with pain. Since the pain and swelling was persistent patient came to the ER and Dopplers revealed DVT of the right lower extremity extending from the distal femoral vein down. On exam patient does have significant swelling and pain and will be admitted for observation. Patient has had a surgery last month for prostate but patient states that he has been ambulatory and not bedridden. He still has some blood in the urine. Denies any chest pain or shortness of breath and has not had a previous episodes of any DVT. Denies any recent travel. Denies any family history of DVT or pulmonary embolism.   Review of Systems: As presented in the history of presenting illness, rest negative.  Past Medical History  Diagnosis Date  . CAD (coronary artery disease)   . S/P CABG (coronary artery bypass graft) 1998    LIMA to LAD, VG to PDA, VG to first diagonal  . Hypertension   . CKD (chronic kidney disease)   . Dyslipidemia   . History of ischemic cardiomyopathy    Past Surgical History  Procedure Laterality Date  . Coronary artery bypass graft  07/08/1997    LIMA to LAD, reverse SVG to PDA, reverse SVG to first diagonal (Dr. Wynetta Giles)  . Cholecystectomy  1998  . Transthoracic echocardiogram  11/29/2012    EF 45-50%, grade 1 diastolic dysfunction  . Nm myocar perf wall motion  11/2008    bruce myoview; stress images show modeate in intensity perfusion defect in apical anterior, apical setpal, apical & septal walls on stress, resting images with no significant defect  reversibility - abnormal, but low risk scan   Social History:  reports that he has never smoked. He has never used smokeless tobacco. He reports that he does not drink alcohol or use illicit drugs. Where does patient live home. Can patient participate in ADLs? Yes.  No Known Allergies  Family History:  Family History  Problem Relation Age of Onset  . Emphysema Mother   . Colon cancer Father   . Lung cancer Brother       Prior to Admission medications   Medication Sig Start Date End Date Taking? Authorizing Provider  amLODipine-benazepril (LOTREL) 5-20 MG per capsule Take 1 capsule by mouth daily.   Yes Historical Provider, MD  aspirin 81 MG tablet Take 81 mg by mouth daily.   Yes Historical Provider, MD  atorvastatin (LIPITOR) 20 MG tablet Take 1 tablet (20 mg total) by mouth daily. 11/18/13  Yes Patrick Nose, MD  BEE POLLEN PO Take 550 mg by mouth daily.    Yes Historical Provider, MD  GARLIC PO Take 8,119 mg by mouth daily.    Yes Historical Provider, MD  metoprolol succinate (TOPROL-XL) 25 MG 24 hr tablet Take 0.5 tablets (12.5 mg total) by mouth daily. 11/18/13  Yes Patrick Nose, MD  Multiple Vitamins-Minerals (ALIVE MENS ENERGY PO) Take 1 tablet by mouth daily.    Yes Historical Provider, MD  Omega-3 Fatty Acids (FISH OIL) 1200 MG CAPS Take 2 capsules by mouth daily.   Yes Historical Provider, MD  amLODipine-benazepril (  LOTREL) 5-20 MG per capsule TAKE 1 CAPSULE BY MOUTH DAILY. Patient not taking: Reported on 07/23/2014 11/18/13   Patrick NoseKenneth C. Hilty, MD    Physical Exam: Filed Vitals:   07/23/14 2230 07/23/14 2245 07/23/14 2300 07/23/14 2350  BP: 141/62 136/58 128/62 137/64  Pulse: 97 92 89 87  Temp:    100 F (37.8 C)  TempSrc:    Oral  Resp: 19 18 21 18   Height:    5' 9.5" (1.765 m)  Weight:    93.622 kg (206 lb 6.4 oz)  SpO2: 96% 97% 95% 97%     General:  Well-developed well-nourished.  Eyes: anicteric no pallor.  ENT: no discharge from the ears eyes Giles and  mouth.  Neck: no mass felt.  Cardiovascular: S1-S2 heard.  Respiratory: no rhonchi or crepitations.  Abdomen: soft nontender bowel sounds present. No guarding or rigidity.  Skin: mild erythema of the lower extremity.  Musculoskeletal: right lower extremity is swollen and tense but patient has good sensation. Swelling extends up to the knee.  Psychiatric: appears normal.  Neurologic: alert awake oriented to time place and person. Moves all extremities.  Labs on Admission:  Basic Metabolic Panel:  Recent Labs Lab 07/23/14 1917  NA 139  K 4.6  CL 101  CO2 21  GLUCOSE 123*  BUN 28*  CREATININE 1.69*  CALCIUM 9.6   Liver Function Tests:  Recent Labs Lab 07/23/14 1917  AST 13  ALT 13  ALKPHOS 67  BILITOT 0.9  PROT 7.6  ALBUMIN 3.5   No results for input(s): LIPASE, AMYLASE in the last 168 hours. No results for input(s): AMMONIA in the last 168 hours. CBC:  Recent Labs Lab 07/23/14 1648 07/23/14 1917  WBC 12.9* 12.2*  NEUTROABS  --  9.6*  HGB 13.6* 12.9*  HCT 41.7* 38.3*  MCV 91.0 87.2  PLT  --  180   Cardiac Enzymes: No results for input(s): CKTOTAL, CKMB, CKMBINDEX, TROPONINI in the last 168 hours.  BNP (last 3 results) No results for input(s): PROBNP in the last 8760 hours. CBG: No results for input(s): GLUCAP in the last 168 hours.  Radiological Exams on Admission: No results found.   Assessment/Plan Principal Problem:   DVT, lower extremity Active Problems:   CAD (coronary artery disease)   Dyslipidemia   CKD (chronic kidney disease) stage 3, GFR 30-59 ml/min   Essential hypertension   DVT (deep venous thrombosis)   1. Right lower estimate a DVT unprovoked - at this time patient has been placed on Lovenox and may transition to Coumadin or the newer oral anticoagulants. It is to remember that patient does have chronic kidney disease and newer  anticoagulants may be sometimes not be appropriate. Patient may eventually need follow-up with  hematologist for length of anticoagulants treatment. At this time patient was admitted because patient was having increasing pain and also has mild hematuria from recent prostate surgery. Since patient is placed on Lovenox closely observe for any development of compartment syndrome and any worsening of hematuria and hemoglobin drop. 2. Anemia - closely follow CBC for any further worsening as patient is on Lovenox. Further workup as outpatient if there is no significant fall in hemoglobin. 3. CAD status post CABG - denies any chest pain. 4. Hypertension - continue home medications. 5. Hyperlipidemia - continue home medications. 6. Chronic kidney disease stage III - closely follow metabolic panel.    Code Status: full code.  Family Communication: patient's wife at the bedside.  Disposition Plan:  admit for observation.    Yanai Hobson N. [facility] Pager [phone].  If 7PM-7AM, please contact night-coverage [email or web address] Password [email or web address] 07/24/2014, 12:13 AM

## 2014-07-24 NOTE — Plan of Care (Signed)
Problem: Phase I Progression Outcomes Goal: Pain controlled with appropriate interventions Outcome: Completed/Met Date Met:  07/24/14 Goal: Tolerating diet Outcome: Completed/Met Date Met:  07/24/14 Goal: Initial discharge plan identified Outcome: Completed/Met Date Met:  07/24/14 Goal: Voiding-avoid urinary catheter unless indicated Outcome: Completed/Met Date Met:  07/24/14

## 2014-07-24 NOTE — Progress Notes (Signed)
Transfer of care note  [facility] and [facility]. He is a 80 y/o male here with Acute RLE DVT and PMHx of CAD, HTN, HLD, And CKD stage 3.   Pt with extensive right lower extremity DVT and partial left lower extremity DVT. After long discussion with him he prefers to be discharged close follow-up.  Please see discharge summary for full details  Murtis SinkSam Jaelani Posa, MD [facility] Resident, PGY-3 07/24/2014, 2:34 PM

## 2014-07-26 ENCOUNTER — Ambulatory Visit (INDEPENDENT_AMBULATORY_CARE_PROVIDER_SITE_OTHER): Payer: BC Managed Care – PPO | Admitting: Emergency Medicine

## 2014-07-26 VITALS — BP 100/56 | HR 107 | Temp 98.7°F | Resp 16 | Ht 69.5 in | Wt 202.0 lb

## 2014-07-26 DIAGNOSIS — I82409 Acute embolism and thrombosis of unspecified deep veins of unspecified lower extremity: Secondary | ICD-10-CM

## 2014-07-26 DIAGNOSIS — D62 Acute posthemorrhagic anemia: Secondary | ICD-10-CM

## 2014-07-26 DIAGNOSIS — Z7901 Long term (current) use of anticoagulants: Secondary | ICD-10-CM

## 2014-07-26 LAB — POCT CBC
Granulocyte percent: 79.2 %G (ref 37–80)
HEMATOCRIT: 41.5 % — AB (ref 43.5–53.7)
Hemoglobin: 13.5 g/dL — AB (ref 14.1–18.1)
Lymph, poc: 1.9 (ref 0.6–3.4)
MCH, POC: 29.1 pg (ref 27–31.2)
MCHC: 32.6 g/dL (ref 31.8–35.4)
MCV: 89.4 fL (ref 80–97)
MID (cbc): 0.8 (ref 0–0.9)
MPV: 7.7 fL (ref 0–99.8)
PLATELET COUNT, POC: 271 10*3/uL (ref 142–424)
POC Granulocyte: 10.2 — AB (ref 2–6.9)
POC LYMPH PERCENT: 14.5 %L (ref 10–50)
POC MID %: 6.3 %M (ref 0–12)
RBC: 4.64 M/uL — AB (ref 4.69–6.13)
RDW, POC: 13.5 %
WBC: 12.9 10*3/uL — AB (ref 4.6–10.2)

## 2014-07-26 LAB — COMPREHENSIVE METABOLIC PANEL
ALBUMIN: 3.6 g/dL (ref 3.5–5.2)
ALT: 29 U/L (ref 0–53)
AST: 26 U/L (ref 0–37)
Alkaline Phosphatase: 65 U/L (ref 39–117)
BUN: 28 mg/dL — AB (ref 6–23)
CALCIUM: 9.5 mg/dL (ref 8.4–10.5)
CHLORIDE: 100 meq/L (ref 96–112)
CO2: 23 meq/L (ref 19–32)
Creat: 1.84 mg/dL — ABNORMAL HIGH (ref 0.50–1.35)
GLUCOSE: 162 mg/dL — AB (ref 70–99)
POTASSIUM: 4.8 meq/L (ref 3.5–5.3)
Sodium: 136 mEq/L (ref 135–145)
Total Bilirubin: 0.8 mg/dL (ref 0.2–1.2)
Total Protein: 7.3 g/dL (ref 6.0–8.3)

## 2014-07-26 NOTE — Progress Notes (Signed)
[facility] [address], [place] [address] [phone]  Date:  07/26/2014   Name:  Patrick LaurenceRonald G Genther   DOB:  1933/05/21   MRN:  [id number]  PCP:  Tally DueGUEST, CHRIS WARREN, MD    Chief Complaint: Hospitalization Follow-up   History of Present Illness:  Patrick Giles is a 80 y.o. very pleasant male patient who presents with the following:  Released from hospital Thursday following  Admission for DVT.  Now on eliquis and had a 2 gram drop in Hbg overnight. Some exertional shortness of breath.  No cough or wheezing. No chest pain.  No nausea or vomiting. No hematemesis or melena. Denies other complaint or health concern today.   Patient Active Problem List   Diagnosis Date Noted  . Essential hypertension 07/24/2014  . DVT (deep venous thrombosis) 07/24/2014  . DVT, lower extremity 07/23/2014  . CAD (coronary artery disease) 11/18/2013  . S/P CABG x 3 11/18/2013  . Cardiomyopathy, ischemic 11/18/2013  . Dyslipidemia 11/18/2013  . CKD (chronic kidney disease) stage 3, GFR 30-59 ml/min 11/18/2013  . Impaired fasting glucose 11/18/2013    Past Medical History  Diagnosis Date  . CAD (coronary artery disease)   . S/P CABG (coronary artery bypass graft) 1998    LIMA to LAD, VG to PDA, VG to first diagonal  . Hypertension   . CKD (chronic kidney disease)   . Dyslipidemia   . History of ischemic cardiomyopathy     Past Surgical History  Procedure Laterality Date  . Coronary artery bypass graft  07/08/1997    LIMA to LAD, reverse SVG to PDA, reverse SVG to first diagonal (Dr. Wynetta FinesE. Gerhardt)  . Cholecystectomy  1998  . Transthoracic echocardiogram  11/29/2012    EF 45-50%, grade 1 diastolic dysfunction  . Nm myocar perf wall motion  11/2008    bruce myoview; stress images show modeate in intensity perfusion defect in apical anterior, apical setpal, apical & septal walls on stress, resting images with no significant defect reversibility - abnormal, but low risk  scan    History  Substance Use Topics  . Smoking status: Never Smoker   . Smokeless tobacco: Never Used  . Alcohol Use: No    Family History  Problem Relation Age of Onset  . Emphysema Mother   . Colon cancer Father   . Lung cancer Brother     No Known Allergies  Medication list has been reviewed and updated.  Current Outpatient Prescriptions on File Prior to Visit  Medication Sig Dispense Refill  . amLODipine-benazepril (LOTREL) 5-20 MG per capsule TAKE 1 CAPSULE BY MOUTH DAILY. 30 capsule 11  . amLODipine-benazepril (LOTREL) 5-20 MG per capsule Take 1 capsule by mouth daily.    Marland Kitchen. apixaban (ELIQUIS) 5 MG TABS tablet Take 2 tabs twice daily for 1 week then take 1 tab twice daily 70 tablet 0  . aspirin 81 MG tablet Take 81 mg by mouth daily.    Marland Kitchen. atorvastatin (LIPITOR) 20 MG tablet Take 1 tablet (20 mg total) by mouth daily. 30 tablet 11  . BEE POLLEN PO Take 550 mg by mouth daily.     Marland Kitchen. GARLIC PO Take 0,8651,000 mg by mouth daily.     . metoprolol succinate (TOPROL-XL) 25 MG 24 hr tablet Take 0.5 tablets (12.5 mg total) by mouth daily. 15 tablet 11  . Multiple Vitamins-Minerals (ALIVE MENS ENERGY PO) Take 1 tablet by mouth daily.     . Omega-3 Fatty  Acids (FISH OIL) 1200 MG CAPS Take 2 capsules by mouth daily.     No current facility-administered medications on file prior to visit.    Review of Systems:  As per HPI, otherwise negative.    Physical Examination: Filed Vitals:   07/26/14 1003  BP: 100/56  Pulse: 107  Temp: 98.7 F (37.1 C)  Resp: 16   Filed Vitals:   07/26/14 1003  Height: 5' 9.5" (1.765 m)  Weight: 202 lb (91.627 kg)   Body mass index is 29.41 kg/(m^2). Ideal Body Weight: Weight in (lb) to have BMI = 25: 171.4   GEN: WDWN, NAD, Non-toxic, Alert & Oriented x 3 HEENT: Atraumatic, Normocephalic.  Ears and Nose: No external deformity. EXTR: No clubbing/cyanosis/edema NEURO: Normal gait.  PSYCH: Normally interactive. Conversant. Not depressed or  anxious appearing.  Calm demeanor.    Assessment and Plan: Blood pressure drop since last visit  Signed,  Phillips OdorJeffery Pang Robers, MD   Results for orders placed or performed in visit on 07/26/14  POCT CBC  Result Value Ref Range   WBC 12.9 (A) 4.6 - 10.2 K/uL   Lymph, poc 1.9 0.6 - 3.4   POC LYMPH PERCENT 14.5 10 - 50 %L   MID (cbc) 0.8 0 - 0.9   POC MID % 6.3 0 - 12 %M   POC Granulocyte 10.2 (A) 2 - 6.9   Granulocyte percent 79.2 37 - 80 %G   RBC 4.64 (A) 4.69 - 6.13 M/uL   Hemoglobin 13.5 (A) 14.1 - 18.1 g/dL   HCT, POC 96.041.5 (A) 45.443.5 - 53.7 %   MCV 89.4 80 - 97 fL   MCH, POC 29.1 27 - 31.2 pg   MCHC 32.6 31.8 - 35.4 g/dL   RDW, POC 09.813.5 %   Platelet Count, POC 271 142 - 424 K/uL   MPV 7.7 0 - 99.8 fL

## 2014-07-26 NOTE — Patient Instructions (Signed)
Anemia, Nonspecific Anemia is a condition in which the concentration of red blood cells or hemoglobin in the blood is below normal. Hemoglobin is a substance in red blood cells that carries oxygen to the tissues of the body. Anemia results in not enough oxygen reaching these tissues.  CAUSES  Common causes of anemia include:   Excessive bleeding. Bleeding may be internal or external. This includes excessive bleeding from periods (in women) or from the intestine.   Poor nutrition.   Chronic kidney, thyroid, and liver disease.  Bone marrow disorders that decrease red blood cell production.  Cancer and treatments for cancer.  HIV, AIDS, and their treatments.  Spleen problems that increase red blood cell destruction.  Blood disorders.  Excess destruction of red blood cells due to infection, medicines, and autoimmune disorders. SIGNS AND SYMPTOMS   Minor weakness.   Dizziness.   Headache.  Palpitations.   Shortness of breath, especially with exercise.   Paleness.  Cold sensitivity.  Indigestion.  Nausea.  Difficulty sleeping.  Difficulty concentrating. Symptoms may occur suddenly or they may develop slowly.  DIAGNOSIS  Additional blood tests are often needed. These help your health care provider determine the best treatment. Your health care provider will check your stool for blood and look for other causes of blood loss.  TREATMENT  Treatment varies depending on the cause of the anemia. Treatment can include:   Supplements of iron, vitamin B12, or folic acid.   Hormone medicines.   A blood transfusion. This may be needed if blood loss is severe.   Hospitalization. This may be needed if there is significant continual blood loss.   Dietary changes.  Spleen removal. HOME CARE INSTRUCTIONS Keep all follow-up appointments. It often takes many weeks to correct anemia, and having your health care provider check on your condition and your response to  treatment is very important. SEEK IMMEDIATE MEDICAL CARE IF:   You develop extreme weakness, shortness of breath, or chest pain.   You become dizzy or have trouble concentrating.  You develop heavy vaginal bleeding.   You develop a rash.   You have bloody or black, tarry stools.   You faint.   You vomit up blood.   You vomit repeatedly.   You have abdominal pain.  You have a fever or persistent symptoms for more than 2-3 days.   You have a fever and your symptoms suddenly get worse.   You are dehydrated.  MAKE SURE YOU:  Understand these instructions.  Will watch your condition.  Will get help right away if you are not doing well or get worse. Document Released: 10/13/2004 Document Revised: 05/08/2013 Document Reviewed: 03/01/2013 ExitCare Patient Information 2015 [employer]. This information is not intended to replace advice given to you by your health care provider. Make sure you discuss any questions you have with your health care provider.  

## 2014-07-29 ENCOUNTER — Encounter: Payer: Self-pay | Admitting: Gastroenterology

## 2014-07-31 ENCOUNTER — Telehealth: Payer: Self-pay | Admitting: *Deleted

## 2014-07-31 ENCOUNTER — Other Ambulatory Visit: Payer: Self-pay | Admitting: *Deleted

## 2014-07-31 DIAGNOSIS — R739 Hyperglycemia, unspecified: Secondary | ICD-10-CM

## 2014-07-31 NOTE — Telephone Encounter (Signed)
Pt notified of results and will return

## 2014-08-01 ENCOUNTER — Other Ambulatory Visit (INDEPENDENT_AMBULATORY_CARE_PROVIDER_SITE_OTHER): Payer: BC Managed Care – PPO | Admitting: Family Medicine

## 2014-08-01 DIAGNOSIS — R739 Hyperglycemia, unspecified: Secondary | ICD-10-CM

## 2014-08-01 LAB — POCT UA - MICROSCOPIC ONLY
CASTS, UR, LPF, POC: NEGATIVE
CRYSTALS, UR, HPF, POC: NEGATIVE
Mucus, UA: NEGATIVE
Yeast, UA: NEGATIVE

## 2014-08-01 LAB — BASIC METABOLIC PANEL WITH GFR
BUN: 23 mg/dL (ref 6–23)
CALCIUM: 9.8 mg/dL (ref 8.4–10.5)
CO2: 27 mEq/L (ref 19–32)
Chloride: 102 mEq/L (ref 96–112)
Creat: 1.55 mg/dL — ABNORMAL HIGH (ref 0.50–1.35)
GFR, EST AFRICAN AMERICAN: 49 mL/min — AB
GFR, Est Non African American: 42 mL/min — ABNORMAL LOW
Glucose, Bld: 109 mg/dL — ABNORMAL HIGH (ref 70–99)
Potassium: 4.9 mEq/L (ref 3.5–5.3)
SODIUM: 139 meq/L (ref 135–145)

## 2014-08-01 LAB — POCT URINALYSIS DIPSTICK
Bilirubin, UA: NEGATIVE
Glucose, UA: NEGATIVE
NITRITE UA: NEGATIVE
PH UA: 5.5
Protein, UA: 30
Spec Grav, UA: 1.02
Urobilinogen, UA: 0.2

## 2014-08-01 LAB — POCT GLYCOSYLATED HEMOGLOBIN (HGB A1C): Hemoglobin A1C: 6.3

## 2014-08-01 NOTE — Progress Notes (Signed)
Patient came in for lab only.

## 2014-08-05 ENCOUNTER — Telehealth: Payer: Self-pay | Admitting: *Deleted

## 2014-08-05 NOTE — Telephone Encounter (Signed)
Pt's wife called to inquire about recent lab results. Looks like they still need review. Please advise.

## 2014-08-10 NOTE — Telephone Encounter (Signed)
Pt notified. Any other comments Dr. Dareen PianoAnderson? What should his f/u plan be? When should he RTC to recheck his DVT?

## 2014-08-10 NOTE — Telephone Encounter (Signed)
I have reviewed the patient's labs.  His Cr (a look at how his kidneys are functioning) was still slightly elevated but better than at his last visit.  He urine has blood which is not abnormal for him.  Overall his labs look like they are at his baseline.    Dr Dareen PianoAnderson - [place] Childes[place]I

## 2014-08-11 NOTE — Telephone Encounter (Signed)
A month

## 2014-08-11 NOTE — Telephone Encounter (Signed)
Pt.notified

## 2014-08-18 ENCOUNTER — Other Ambulatory Visit: Payer: Self-pay | Admitting: Family Medicine

## 2014-08-18 ENCOUNTER — Telehealth: Payer: Self-pay | Admitting: *Deleted

## 2014-08-18 NOTE — Telephone Encounter (Signed)
I agree that he does need his eliquis refilled.   [facility] and, although they may not make appts, he still has a PCP and they should continue his meds.  He should also f/u with them ASAP.  I will refill if he contacts them and they state for some reason they cant, he has a DVT. However, in my experience they will take very good care of him, he need only call and ask for a refill.   Murtis SinkSam Bradshaw, MD [facility] Resident, PGY-3 08/18/2014, 2:18 PM

## 2014-08-18 NOTE — Telephone Encounter (Signed)
Pt called and wanted to make appt with Dr. Ermalinda MemosBradshaw to refill his eloquis.  Advised that he is not a pt here, which pt understood but was under the impression that Dr. Ermalinda MemosBradshaw would refill his medication because [facility] and [facility] do not schedule appts.  Will forward to MD. Milas GainFleeger, Maryjo RochesterJessica Dawn

## 2014-08-19 NOTE — Telephone Encounter (Signed)
Left message on patient voicemail informing of message from MD. 

## 2014-08-21 ENCOUNTER — Ambulatory Visit (INDEPENDENT_AMBULATORY_CARE_PROVIDER_SITE_OTHER): Payer: BC Managed Care – PPO | Admitting: Emergency Medicine

## 2014-08-21 VITALS — BP 108/60 | HR 83 | Temp 97.8°F | Resp 16 | Ht 69.0 in | Wt 205.0 lb

## 2014-08-21 DIAGNOSIS — I82409 Acute embolism and thrombosis of unspecified deep veins of unspecified lower extremity: Secondary | ICD-10-CM

## 2014-08-21 MED ORDER — APIXABAN 5 MG PO TABS: 5.0000 mg | ORAL_TABLET | Freq: Two times a day (BID) | ORAL | Status: DC

## 2014-08-21 NOTE — Patient Instructions (Signed)
Apixaban oral tablets What is this medicine? APIXABAN (a PIX a ban) is an anticoagulant (blood thinner). It is used to lower the chance of stroke in people with a medical condition called atrial fibrillation. It is also used to treat or prevent blood clots in the lungs or in the veins. This medicine may be used for other purposes; ask your health care provider or pharmacist if you have questions. COMMON BRAND NAME(S): Eliquis What should I tell my health care provider before I take this medicine? They need to know if you have any of these conditions: -bleeding disorders -bleeding in the brain -blood in your stools (black or tarry stools) or if you have blood in your vomit -history of stomach bleeding -kidney disease -liver disease -mechanical heart valve -an unusual or allergic reaction to apixaban, other medicines, foods, dyes, or preservatives -pregnant or trying to get pregnant -breast-feeding How should I use this medicine? Take this medicine by mouth with a glass of water. Follow the directions on the prescription label. You can take it with or without food. If it upsets your stomach, take it with food. Take your medicine at regular intervals. Do not take it more often than directed. Do not stop taking except on your doctor's advice. Stopping this medicine may increase your risk of a blot clot. Be sure to refill your prescription before you run out of medicine. Talk to your pediatrician regarding the use of this medicine in children. Special care may be needed. Overdosage: If you think you have taken too much of this medicine contact a poison control center or emergency room at once. NOTE: This medicine is only for you. Do not share this medicine with others. What if I miss a dose? If you miss a dose, take it as soon as you can. If it is almost time for your next dose, take only that dose. Do not take double or extra doses. What may interact with this medicine? This medicine may  interact with the following: -aspirin and aspirin-like medicines -certain medicines for fungal infections like ketoconazole and itraconazole -certain medicines for seizures like carbamazepine and phenytoin -certain medicines that treat or prevent blood clots like warfarin, enoxaparin, and dalteparin -clarithromycin -NSAIDs, medicines for pain and inflammation, like ibuprofen or naproxen -rifampin -ritonavir -St. John's wort This list may not describe all possible interactions. Give your health care provider a list of all the medicines, herbs, non-prescription drugs, or dietary supplements you use. Also tell them if you smoke, drink alcohol, or use illegal drugs. Some items may interact with your medicine. What should I watch for while using this medicine? Notify your doctor or health care professional and seek emergency treatment if you develop breathing problems; changes in vision; chest pain; severe, sudden headache; pain, swelling, warmth in the leg; trouble speaking; sudden numbness or weakness of the face, arm, or leg. These can be signs that your condition has gotten worse. If you are going to have surgery, tell your doctor or health care professional that you are taking this medicine. Tell your health care professional that you use this medicine before you have a spinal or epidural procedure. Sometimes people who take this medicine have bleeding problems around the spine when they have a spinal or epidural procedure. This bleeding is very rare. If you have a spinal or epidural procedure while on this medicine, call your health care professional immediately if you have back pain, numbness or tingling (especially in your legs and feet), muscle weakness, paralysis, or loss   of bladder or bowel control. Avoid sports and activities that might cause injury while you are using this medicine. Severe falls or injuries can cause unseen bleeding. Be careful when using sharp tools or knives. Consider using  an electric razor. Take special care brushing or flossing your teeth. Report any injuries, bruising, or red spots on the skin to your doctor or health care professional. What side effects may I notice from receiving this medicine? Side effects that you should report to your doctor or health care professional as soon as possible: -allergic reactions like skin rash, itching or hives, swelling of the face, lips, or tongue -signs and symptoms of bleeding such as bloody or black, tarry stools; red or dark-brown urine; spitting up blood or brown material that looks like coffee grounds; red spots on the skin; unusual bruising or bleeding from the eye, gums, or nose This list may not describe all possible side effects. Call your doctor for medical advice about side effects. You may report side effects to FDA at 1-800-FDA-1088. Where should I keep my medicine? Keep out of the reach of children. Store at room temperature between 20 and 25 degrees C (68 and 77 degrees F). Throw away any unused medicine after the expiration date. NOTE: This sheet is a summary. It may not cover all possible information. If you have questions about this medicine, talk to your doctor, pharmacist, or health care provider.  2015, Elsevier/Gold Standard. (2013-05-10 11:59:24)  

## 2014-08-21 NOTE — Progress Notes (Signed)
[facility] [address], [place] [address] [phone]  Date:  08/21/2014   Name:  Patrick LaurenceRonald G Dimmer   DOB:  [birth year]   MRN:  [id number]  PCP:  Tally DueGUEST, CHRIS WARREN, MD    Chief Complaint: Follow-up and rx refills   History of Present Illness:  Patrick Giles is a 80 y.o. very pleasant male patient who presents with the following:  Treated and hospitalized 11/4 for DVT.  Discharged on elloquis Needs reevaluation No bleeding.  No interference with appetite of nausea or stool change.   Exercising.   Denies other complaint or health concern today.   Patient Active Problem List   Diagnosis Date Noted  . Essential hypertension 07/24/2014  . DVT (deep venous thrombosis) 07/24/2014  . DVT, lower extremity 07/23/2014  . CAD (coronary artery disease) 11/18/2013  . S/P CABG x 3 11/18/2013  . Cardiomyopathy, ischemic 11/18/2013  . Dyslipidemia 11/18/2013  . CKD (chronic kidney disease) stage 3, GFR 30-59 ml/min 11/18/2013  . Impaired fasting glucose 11/18/2013    Past Medical History  Diagnosis Date  . CAD (coronary artery disease)   . S/P CABG (coronary artery bypass graft) 1998    LIMA to LAD, VG to PDA, VG to first diagonal  . Hypertension   . CKD (chronic kidney disease)   . Dyslipidemia   . History of ischemic cardiomyopathy     Past Surgical History  Procedure Laterality Date  . Coronary artery bypass graft  07/08/1997    LIMA to LAD, reverse SVG to PDA, reverse SVG to first diagonal (Dr. Wynetta FinesE. Gerhardt)  . Cholecystectomy  1998  . Transthoracic echocardiogram  11/29/2012    EF 45-50%, grade 1 diastolic dysfunction  . Nm myocar perf wall motion  11/2008    bruce myoview; stress images show modeate in intensity perfusion defect in apical anterior, apical setpal, apical & septal walls on stress, resting images with no significant defect reversibility - abnormal, but low risk scan    History  Substance Use Topics  . Smoking status: Never Smoker    . Smokeless tobacco: Never Used  . Alcohol Use: No    Family History  Problem Relation Age of Onset  . Emphysema Mother   . Colon cancer Father   . Lung cancer Brother     No Known Allergies  Medication list has been reviewed and updated.  Current Outpatient Prescriptions on File Prior to Visit  Medication Sig Dispense Refill  . amLODipine-benazepril (LOTREL) 5-20 MG per capsule TAKE 1 CAPSULE BY MOUTH DAILY. 30 capsule 11  . apixaban (ELIQUIS) 5 MG TABS tablet Take 2 tabs twice daily for 1 week then take 1 tab twice daily 70 tablet 0  . atorvastatin (LIPITOR) 20 MG tablet Take 1 tablet (20 mg total) by mouth daily. 30 tablet 11  . BEE POLLEN PO Take 550 mg by mouth daily.     Marland Kitchen. GARLIC PO Take 2,9561,000 mg by mouth daily.     . metoprolol succinate (TOPROL-XL) 25 MG 24 hr tablet Take 0.5 tablets (12.5 mg total) by mouth daily. 15 tablet 11  . Multiple Vitamins-Minerals (ALIVE MENS ENERGY PO) Take 1 tablet by mouth daily.     . Omega-3 Fatty Acids (FISH OIL) 1200 MG CAPS Take 2 capsules by mouth daily.    Marland Kitchen. aspirin 81 MG tablet Take 81 mg by mouth daily.     No current facility-administered medications on file prior to visit.    Review  of Systems:  As per HPI, otherwise negative.    Physical Examination: Filed Vitals:   08/21/14 0814  BP: 108/60  Pulse: 83  Temp: 97.8 F (36.6 C)  Resp: 16   Filed Vitals:   08/21/14 0814  Height: 5\' 9"  (1.753 m)  Weight: 205 lb (92.987 kg)   Body mass index is 30.26 kg/(m^2). Ideal Body Weight: Weight in (lb) to have BMI = 25: 168.9   GEN: WDWN, NAD, Non-toxic, Alert & Oriented x 3 HEENT: Atraumatic, Normocephalic.  Ears and Nose: No external deformity. EXTR: No clubbing/cyanosis/edema NEURO: Normal gait.  PSYCH: Normally interactive. Conversant. Not depressed or anxious appearing.  Calm demeanor.    Assessment and Plan: DVT follow up  Continue meds Cut to 2.5 mg BID in March   Signed,  Phillips OdorJeffery Egypt Marchiano, MD

## 2014-09-01 ENCOUNTER — Telehealth: Payer: Self-pay

## 2014-09-01 NOTE — Telephone Encounter (Signed)
Entered in error

## 2014-09-01 NOTE — Telephone Encounter (Signed)
Spoke to patient's wife.  She states patient had his flu shot at work, but she is not sure of the date.

## 2014-09-26 ENCOUNTER — Encounter: Payer: Self-pay | Admitting: Gastroenterology

## 2014-09-26 ENCOUNTER — Ambulatory Visit (INDEPENDENT_AMBULATORY_CARE_PROVIDER_SITE_OTHER): Payer: Medicare PPO | Admitting: Gastroenterology

## 2014-09-26 ENCOUNTER — Other Ambulatory Visit (INDEPENDENT_AMBULATORY_CARE_PROVIDER_SITE_OTHER): Payer: Medicare PPO

## 2014-09-26 VITALS — BP 126/66 | HR 56 | Ht 69.0 in | Wt 211.5 lb

## 2014-09-26 DIAGNOSIS — D649 Anemia, unspecified: Secondary | ICD-10-CM

## 2014-09-26 DIAGNOSIS — Z800 Family history of malignant neoplasm of digestive organs: Secondary | ICD-10-CM

## 2014-09-26 LAB — CBC WITH DIFFERENTIAL/PLATELET
Basophils Absolute: 0 10*3/uL (ref 0.0–0.1)
Basophils Relative: 0.4 % (ref 0.0–3.0)
EOS ABS: 0.1 10*3/uL (ref 0.0–0.7)
Eosinophils Relative: 1.5 % (ref 0.0–5.0)
HEMATOCRIT: 41.8 % (ref 39.0–52.0)
Hemoglobin: 13.7 g/dL (ref 13.0–17.0)
Lymphocytes Relative: 24.1 % (ref 12.0–46.0)
Lymphs Abs: 2 10*3/uL (ref 0.7–4.0)
MCHC: 32.9 g/dL (ref 30.0–36.0)
MCV: 89.1 fl (ref 78.0–100.0)
MONOS PCT: 11 % (ref 3.0–12.0)
Monocytes Absolute: 0.9 10*3/uL (ref 0.1–1.0)
NEUTROS ABS: 5.2 10*3/uL (ref 1.4–7.7)
Neutrophils Relative %: 63 % (ref 43.0–77.0)
PLATELETS: 194 10*3/uL (ref 150.0–400.0)
RBC: 4.69 Mil/uL (ref 4.22–5.81)
RDW: 15.2 % (ref 11.5–15.5)
WBC: 8.3 10*3/uL (ref 4.0–10.5)

## 2014-09-26 NOTE — Patient Instructions (Signed)
You will have labs checked today in the basement lab.  Please head down after you check out with the front desk  (cbc). If you are not anemic, then we will proceed with colon cancer screening (cologuard vs. Colonoscopy). If you are anemic, then we will likely recommend colonoscopy.

## 2014-09-26 NOTE — Progress Notes (Signed)
HPI: This is a    Very pleasant 81 year old man who is here with his wife today. This is the first time I am meeting them.   07/2014 Hb was 13, Cr was 1.8  Father had colon cancer  He is on eliquis blood thinner.  was told he was anemic based on 07/2014 labs  He [facility]  Was having bleeding into his urine, macroscopic prior to urologic surgery.  He had CT of "everything" last year, told it was normal.  Right sided leg DVT 07/2014; wsa planning that he was to be on it "forever"  Never sees blood in his stool.  He has never had colon cancer screening, never had colonoscopy.  Review of systems: Pertinent positive and negative review of systems were noted in the above HPI section. Complete review of systems was performed and was otherwise normal.    Past Medical History  Diagnosis Date  . CAD (coronary artery disease)   . S/P CABG (coronary artery bypass graft) 1998    LIMA to LAD, VG to PDA, VG to first diagonal  . Hypertension   . Dyslipidemia     Past Surgical History  Procedure Laterality Date  . Coronary artery bypass graft  07/08/1997    LIMA to LAD, reverse SVG to PDA, reverse SVG to first diagonal (Dr. Wynetta FinesE. Gerhardt)  . Cholecystectomy  1998  . Transthoracic echocardiogram  11/29/2012    EF 45-50%, grade 1 diastolic dysfunction    Current Outpatient Prescriptions  Medication Sig Dispense Refill  . amLODipine-benazepril (LOTREL) 5-20 MG per capsule TAKE 1 CAPSULE BY MOUTH DAILY. 30 capsule 11  . apixaban (ELIQUIS) 5 MG TABS tablet Take 1 tablet (5 mg total) by mouth 2 (two) times daily. Take 2 tabs twice daily for 1 week then take 1 tab twice daily 60 tablet 5  . atorvastatin (LIPITOR) 20 MG tablet Take 1 tablet (20 mg total) by mouth daily. 30 tablet 11  . BEE POLLEN PO Take 550 mg by mouth daily.     Marland Kitchen. GARLIC PO Take 1,6101,000 mg by mouth daily.     . metoprolol succinate (TOPROL-XL) 25 MG 24 hr tablet Take 0.5 tablets  (12.5 mg total) by mouth daily. 15 tablet 11  . Multiple Vitamins-Minerals (ALIVE MENS ENERGY PO) Take 1 tablet by mouth daily.     . Omega-3 Fatty Acids (FISH OIL) 1200 MG CAPS Take 2 capsules by mouth daily.     No current facility-administered medications for this visit.    Allergies as of 09/26/2014  . (No Known Allergies)    Family History  Problem Relation Age of Onset  . Emphysema Mother   . Colon cancer Father   . Lung cancer Brother   . Colon polyps Father   . Diabetes Neg Hx   . Kidney disease Neg Hx   . Esophageal cancer Neg Hx     History   Social History  . Marital Status: Married    Spouse Name: N/A    Number of Children: 5  . Years of Education: 13   Occupational History  . Retired    Social History Main Topics  . Smoking status: Former Smoker    Types: Cigars    Quit date: 09/19/1988  . Smokeless tobacco: Never Used  . Alcohol Use: No  . Drug Use: No  . Sexual Activity: Not on file   Other Topics Concern  . Not on file   Social History  Narrative       Physical Exam: BP 126/66 mmHg  Pulse 56  Ht  (1.753 m)  Wt 211 lb 8 oz (95.936 kg)  BMI 31.22 kg/m2 Constitutional: generally well-appearing Psychiatric: alert and oriented x3 Eyes: extraocular movements intact Mouth: oral pharynx moist, no lesions Neck: supple no lymphadenopathy Cardiovascular: heart regular rate and rhythm Lungs: clear to auscultation bilaterally Abdomen: soft, nontender, nondistended, no obvious ascites, no peritoneal signs, normal bowel sounds Extremities: no lower extremity edema bilaterally Skin: no lesions on visible extremities    Assessment and plan: 81 y.o. male with   Possible anemia, family history of colon cancer , current blood thinning use for DVT   first it is not clear that he is truly anemic. His labs were checked most recently about 2 months ago and his hemoglobin was 13. Around that time he was having urologic bleeding. Since then he was  started on a blood thinner for right lower leg DVT. We'll long fruitful discussion about the nature of anemia, the nature of colon cancer screening. We decided that we will check to see if he is truly anemic with a repeat CBC today. If he is not anemic and he will decide if you is interested in colon cancer screening at all. Generally he understands colon cancer screening stops between the age of 29 and 28. If he is truly anemic that I would recommend we proceed with colonoscopy given his family history of colon cancer the fact that he has never had colon cancer screening in the past.   Indeed he has chronic renal insufficiency and possibly anemia could be from that.

## 2014-11-10 ENCOUNTER — Other Ambulatory Visit: Payer: Self-pay | Admitting: Internal Medicine

## 2014-11-10 NOTE — Telephone Encounter (Signed)
Rx(s) sent to pharmacy electronically.  

## 2014-11-17 ENCOUNTER — Other Ambulatory Visit: Payer: Self-pay | Admitting: Internal Medicine

## 2014-11-17 NOTE — Telephone Encounter (Signed)
Rx has been sent to the pharmacy electronically. ° °

## 2014-11-19 ENCOUNTER — Ambulatory Visit (INDEPENDENT_AMBULATORY_CARE_PROVIDER_SITE_OTHER): Payer: Medicare PPO | Admitting: Internal Medicine

## 2014-11-19 ENCOUNTER — Encounter: Payer: Self-pay | Admitting: Internal Medicine

## 2014-11-19 VITALS — BP 138/64 | HR 81 | Ht 70.0 in | Wt 215.3 lb

## 2014-11-19 DIAGNOSIS — I2583 Coronary atherosclerosis due to lipid rich plaque: Secondary | ICD-10-CM

## 2014-11-19 DIAGNOSIS — Z951 Presence of aortocoronary bypass graft: Secondary | ICD-10-CM

## 2014-11-19 DIAGNOSIS — E785 Hyperlipidemia, unspecified: Secondary | ICD-10-CM

## 2014-11-19 DIAGNOSIS — I2510 Atherosclerotic heart disease of native coronary artery without angina pectoris: Secondary | ICD-10-CM

## 2014-11-19 DIAGNOSIS — I255 Ischemic cardiomyopathy: Secondary | ICD-10-CM

## 2014-11-19 DIAGNOSIS — I82409 Acute embolism and thrombosis of unspecified deep veins of unspecified lower extremity: Secondary | ICD-10-CM

## 2014-11-19 NOTE — Progress Notes (Signed)
OFFICE NOTE  Chief Complaint:  Recent DVT  Primary Care Physician: Tally Due, MD  HPI:  Patrick Giles is a 81 year old gentleman, previously followed by Dr. Clarene Duke, with history of CABG in 1998 with a LIMA to LAD, saphenous vein graft to PDA, and vein graft to first diagonal. In 2006, he had a stress test, which showed an EF of about 43% to 44% with a fixed inferoapical defect. That was again present in 2010, suggesting he may have lost a vein graft, possibly to the PDA, and has a fixed defect which looks like scar. The recent, repeat echo in 2014 showed an EF of 45-50% with inferolateral hypokinesis. He is able to walk without any shortness of breath or worsening symptoms. He is on an ACE inhibitor and a B-blocker was added due to CAD and HTN.  He has tolerated this well.  He recently had a lipid profile showing total cholesterol 161, HDL 51, triglycerides 126 and LDL 96. Fasting glucose was 119. Creatinine is stable at 1.5.  Patrick Giles returns today for follow-up. In November he was noted to have some blood in the urine and underwent transurethral resection of the prostate. Subsequent to that he was found to have DVT and this may or may not be related to his surgery. He was placed on Eliquis which she continues to take. This is followed by his primary care provider. He denies any worsening chest pain or shortness of breath.   PMHx:  Past Medical History  Diagnosis Date  . CAD (coronary artery disease)   . S/P CABG (coronary artery bypass graft) 1998    LIMA to LAD, VG to PDA, VG to first diagonal  . Hypertension   . Dyslipidemia     Past Surgical History  Procedure Laterality Date  . Coronary artery bypass graft  07/08/1997    LIMA to LAD, reverse SVG to PDA, reverse SVG to first diagonal (Dr. Wynetta Fines)  . Cholecystectomy  1998  . Transthoracic echocardiogram  11/29/2012    EF 45-50%, grade 1 diastolic dysfunction    FAMHx:  Family History  Problem Relation Age of  Onset  . Emphysema Mother   . Colon cancer Father   . Lung cancer Brother   . Colon polyps Father   . Diabetes Neg Hx   . Kidney disease Neg Hx   . Esophageal cancer Neg Hx     SOCHx:   reports that he quit smoking about 26 years ago. His smoking use included Cigars. He has never used smokeless tobacco. He reports that he does not drink alcohol or use illicit drugs.  ALLERGIES:  No Known Allergies  ROS: A comprehensive review of systems was negative except for: Cardiovascular: positive for DVT  HOME MEDS: Current Outpatient Prescriptions  Medication Sig Dispense Refill  . amLODipine-benazepril (LOTREL) 5-20 MG per capsule TAKE 1 CAPSULE BY MOUTH DAILY. 30 capsule 1  . apixaban (ELIQUIS) 5 MG TABS tablet Take 1 tablet (5 mg total) by mouth 2 (two) times daily. Take 2 tabs twice daily for 1 week then take 1 tab twice daily 60 tablet 5  . atorvastatin (LIPITOR) 20 MG tablet Take 1 tablet (20 mg total) by mouth daily. 30 tablet 0  . BEE POLLEN PO Take 550 mg by mouth daily.     Marland Kitchen GARLIC PO Take 1,610 mg by mouth daily.     . metoprolol succinate (TOPROL-XL) 25 MG 24 hr tablet Take 0.5 tablets (12.5 mg total) by  mouth daily. 15 tablet 0  . Multiple Vitamins-Minerals (ALIVE MENS ENERGY PO) Take 1 tablet by mouth daily.     . Omega-3 Fatty Acids (FISH OIL) 1200 MG CAPS Take 2 capsules by mouth daily.     No current facility-administered medications for this visit.    LABS/IMAGING: No results found for this or any previous visit (from the past 48 hour(s)). No results found.  VITALS: BP 138/64 mmHg  Pulse 81  Ht 5\' 10"  (1.778 m)  Wt 215 lb 4.8 oz (97.659 kg)  BMI 30.89 kg/m2  EXAM: General appearance: alert and no distress Neck: no carotid bruit and no JVD Lungs: clear to auscultation bilaterally Heart: regular rate and rhythm, S1, S2 normal, no murmur, click, rub or gallop Abdomen: soft, non-tender; bowel sounds normal; no masses,  no organomegaly Extremities: extremities  normal, atraumatic, no cyanosis or edema Pulses: 2+ and symmetric Skin: Skin color, texture, turgor normal. No rashes or lesions Neurologic: Grossly normal Psych: Mood, affect normal  EKG: Normal sinus rhythm at 81  ASSESSMENT: 1. Coronary artery disease status post three-vessel CABG in 1998 2. Ischemic cardio myopathy EF 45-50%, with inferolateral hypokinesis 3. Hypertension-controlled 4. Dyslipidemia-at goal 5. CKD 3 6. DVT on Eliquis.  PLAN: 1.   Patrick Giles recently had a DVT which may or may not have been related to prostate surgery. He's currently on Eliquis and is having no issues with that medication. Plan is to stay on that until at least this summer and he'll be reevaluated by his primary care provider. He's had no worsening symptoms from multivessel bypass in 1998. He is overdue for assessment of his coronary bypass grafts as it's been greater than 5 years since her last stress test. He is asymptomatic and is not interested in a stress test at this time. We'll revisit the issue at his next office visit. His blood pressure is well controlled and cholesterol has been followed by his primary and is at goal.  Plan to see him back annually or sooner as necessary.  Chrystie NoseKenneth C. Elyzabeth Goatley, MD, [facility] Attending Cardiologist [facility]  Patrick Giles C 11/19/2014, 6:38 PM

## 2014-11-19 NOTE — Patient Instructions (Addendum)
Compression Stockings - Right Leg 20-6730mmHg  Your physician recommends that you return for lab work (fasting)  Your physician wants you to follow-up in: 1 year with Dr. Rennis GoldenHilty. You will receive a reminder letter in the mail two months in advance. If you don't receive a letter, please call our office to schedule the follow-up appointment.

## 2014-12-13 ENCOUNTER — Other Ambulatory Visit: Payer: Self-pay | Admitting: Internal Medicine

## 2014-12-15 NOTE — Telephone Encounter (Signed)
Rx(s) sent to pharmacy electronically.  

## 2015-01-10 ENCOUNTER — Other Ambulatory Visit: Payer: Self-pay | Admitting: Internal Medicine

## 2015-01-12 NOTE — Telephone Encounter (Signed)
Rx(s) sent to pharmacy electronically.  

## 2015-02-01 ENCOUNTER — Other Ambulatory Visit: Payer: Self-pay | Admitting: Emergency Medicine

## 2015-06-24 ENCOUNTER — Ambulatory Visit (INDEPENDENT_AMBULATORY_CARE_PROVIDER_SITE_OTHER): Payer: Medicare PPO | Admitting: Family Medicine

## 2015-06-24 ENCOUNTER — Ambulatory Visit (INDEPENDENT_AMBULATORY_CARE_PROVIDER_SITE_OTHER): Payer: Medicare PPO

## 2015-06-24 VITALS — BP 130/58 | HR 91 | Temp 98.9°F | Resp 18 | Ht 68.5 in | Wt 209.0 lb

## 2015-06-24 DIAGNOSIS — R059 Cough, unspecified: Secondary | ICD-10-CM

## 2015-06-24 DIAGNOSIS — H6123 Impacted cerumen, bilateral: Secondary | ICD-10-CM

## 2015-06-24 DIAGNOSIS — R062 Wheezing: Secondary | ICD-10-CM

## 2015-06-24 DIAGNOSIS — R05 Cough: Secondary | ICD-10-CM

## 2015-06-24 MED ORDER — AZITHROMYCIN 250 MG PO TABS: ORAL_TABLET | ORAL | Status: DC

## 2015-06-24 MED ORDER — HYDROCODONE-HOMATROPINE 5-1.5 MG/5ML PO SYRP: 5.0000 mL | ORAL_SOLUTION | Freq: Three times a day (TID) | ORAL | Status: DC | PRN

## 2015-06-24 NOTE — Progress Notes (Signed)
Subjective:  This chart was scribed for Elvina Sidle MD, by Veverly Fells, at [facility].  This patient was seen in room 2 and the patient's care was started at 11:12 AM.   Chief Complaint  Patient presents with   Cough    dry cough  x 1 week   Sore Throat     Patient ID: Patrick Giles, male    DOB: 1933/01/09, 81 y.o.   MRN: [id number]  HPI  HPI Comments: Patrick Giles is a 81 y.o. male who [facility] complaining of a dry cough onset 1 week ago (worse at night)  with associated symptoms of right sided ear pain.  Patient notes that he is not able to bring up the phlegm as much as he has tried.  He denies any chills/sweats. Patient has no history of asthma or smoking.  His wife has not been ill recently.  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      Patient Active Problem List   Diagnosis Date Noted   Essential hypertension 07/24/2014   DVT (deep venous thrombosis) (HCC) 07/24/2014   DVT, lower extremity (HCC) 07/23/2014   CAD (coronary artery disease) 11/18/2013   S/P CABG x 3 11/18/2013   Cardiomyopathy, ischemic 11/18/2013   Dyslipidemia 11/18/2013   CKD (chronic kidney disease) stage 3, GFR 30-59 ml/min 11/18/2013   Impaired fasting glucose 11/18/2013   Past Medical History  Diagnosis Date   CAD (coronary artery disease)    S/P CABG (coronary artery bypass graft) 1998    LIMA to LAD, VG to PDA, VG to first diagonal   Hypertension    Dyslipidemia    Past Surgical History  Procedure Laterality Date   Coronary artery bypass graft  07/08/1997    LIMA to LAD, reverse SVG to PDA, reverse SVG to first diagonal (Dr. Wynetta Fines)   Cholecystectomy  1998   Transthoracic echocardiogram  11/29/2012    EF 45-50%, grade 1 diastolic dysfunction   No Known Allergies Prior to Admission medications   Medication Sig Start Date End Date  Taking? Authorizing Provider  amLODipine-benazepril (LOTREL) 5-20 MG per capsule Take 1 capsule by mouth daily. 01/12/15  Yes Chrystie Nose, MD  atorvastatin (LIPITOR) 20 MG tablet Take 1 tablet (20 mg total) by mouth daily at 6 PM. 12/15/14  Yes Chrystie Nose, MD  BEE POLLEN PO Take 550 mg by mouth daily.    Yes Historical Provider, MD  ELIQUIS 5 MG TABS tablet TAKE 2 TABLETS TWICE A DAY FOR 1 WEEK, THEN TAKE 1 TABLET TWICE A DAY 02/03/15  Yes Carmelina Dane, MD  GARLIC PO Take 4,098 mg by mouth daily.    Yes Historical Provider, MD  metoprolol succinate (TOPROL-XL) 25 MG 24 hr tablet Take 0.5 tablets (12.5 mg total) by mouth daily. 12/15/14  Yes Chrystie Nose, MD  Multiple Vitamins-Minerals (ALIVE MENS ENERGY PO) Take 1 tablet by mouth daily.    Yes Historical Provider, MD  Omega-3 Fatty Acids (FISH OIL) 1200 MG CAPS Take 2 capsules by mouth daily.   Yes Historical Provider, MD   Social History   Social History   Marital Status: Married    Spouse Name: N/A   Number of Children: 5   Years of Education: 13   Occupational History   Retired    Social History Main Topics  Smoking status: Former Smoker    Types: Cigars    Quit date: 09/19/1988   Smokeless tobacco: Never Used   Alcohol Use: No   Drug Use: No   Sexual Activity: Not on file   Other Topics Concern   Not on file   Social History Narrative        Review of Systems  Constitutional: Negative for fever and chills.  HENT: Positive for ear pain.   Eyes: Negative for pain, discharge and itching.  Respiratory: Positive for cough.   Gastrointestinal: Negative for nausea and vomiting.  Musculoskeletal: Negative for neck pain and neck stiffness.       Objective:   Physical Exam  Constitutional: He is oriented to person, place, and time. He appears well-developed and well-nourished. No distress.  HENT:  Head: Normocephalic and atraumatic.  Bilateral ear wax impaction.  Decreased hearing.   Eyes:  Pupils are equal, round, and reactive to light.  Neck: No tracheal deviation present.  Cardiovascular: Normal rate.   Pulmonary/Chest: No respiratory distress.  bialteral expiratory wheezes and inspritatory wheezes on the left- anterior.   Musculoskeletal: Normal range of motion.  Neurological: He is alert and oriented to person, place, and time.  Skin: Skin is warm and dry.  Psychiatric: He has a normal mood and affect. His behavior is normal.  Nursing note and vitals reviewed.   [facility] (PRIMARY) x-ray report read by Dr. Milus Glazier  CXR: Markedly elevated left hemi diaphragm. 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     Filed Vitals:   06/24/15 1015  BP: 130/58  Pulse: 91  Temp: 98.9 F (37.2 C)  TempSrc: Oral  Resp: 18  Height: 5' 8.5" (1.74 m)  Weight: 209 lb (94.802 kg)  SpO2: 97%       Assessment & Plan:   This chart was scribed in my presence and reviewed by me personally.  Patient appears to have acute bronchitis. Because the length of time he's had it, we'll proceed with an antibiotic and some cough medicine. Will give flu shot today. If patients not feeling better in 3-4 days, he should return.    ICD-9-CM ICD-10-CM   1. Cough 786.2 R05 DG Chest 2 View     azithromycin (ZITHROMAX) 250 MG tablet     HYDROcodone-homatropine (HYCODAN) 5-1.5 MG/5ML syrup     Flu Vaccine QUAD 36+ mos IM  2. Wheezing 786.07 R06.2 DG Chest 2 View     azithromycin (ZITHROMAX) 250 MG tablet     HYDROcodone-homatropine (HYCODAN) 5-1.5 MG/5ML syrup     Flu Vaccine QUAD 36+ mos IM  3. Cerumen impaction, bilateral 380.4 H61.23      Signed, Elvina Sidle, MD

## 2015-06-24 NOTE — Patient Instructions (Signed)
Please let us know if you're not better by Saturday or Sunday so we can take other measures to control this cough.   Acute Bronchitis Bronchitis is inflammation of the airways that extend from the windpipe into the lungs (bronchi). The inflammation often causes mucus to develop. This leads to a cough, which is the most common symptom of bronchitis.  In acute bronchitis, the condition usually develops suddenly and goes away over time, usually in a couple weeks. Smoking, allergies, and asthma can make bronchitis worse. Repeated episodes of bronchitis may cause further lung problems.  CAUSES Acute bronchitis is most often caused by the same virus that causes a cold. The virus can spread from person to person (contagious) through coughing, sneezing, and touching contaminated objects. SIGNS AND SYMPTOMS   Cough.   Fever.   Coughing up mucus.   Body aches.   Chest congestion.   Chills.   Shortness of breath.   Sore throat.  DIAGNOSIS  Acute bronchitis is usually diagnosed through a physical exam. Your health care provider will also ask you questions about your medical history. Tests, such as chest X-rays, are sometimes done to rule out other conditions.  TREATMENT  Acute bronchitis usually goes away in a couple weeks. Oftentimes, no medical treatment is necessary. Medicines are sometimes given for relief of fever or cough. Antibiotic medicines are usually not needed but may be prescribed in certain situations. In some cases, an inhaler may be recommended to help reduce shortness of breath and control the cough. A cool mist vaporizer may also be used to help thin bronchial secretions and make it easier to clear the chest.  HOME CARE INSTRUCTIONS  Get plenty of rest.   Drink enough fluids to keep your urine clear or pale yellow (unless you have a medical condition that requires fluid restriction). Increasing fluids may help thin your respiratory secretions (sputum) and reduce chest  congestion, and it will prevent dehydration.   Take medicines only as directed by your health care provider.  If you were prescribed an antibiotic medicine, finish it all even if you start to feel better.  Avoid smoking and secondhand smoke. Exposure to cigarette smoke or irritating chemicals will make bronchitis worse. If you are a smoker, consider using nicotine gum or skin patches to help control withdrawal symptoms. Quitting smoking will help your lungs heal faster.   Reduce the chances of another bout of acute bronchitis by washing your hands frequently, avoiding people with cold symptoms, and trying not to touch your hands to your mouth, nose, or eyes.   Keep all follow-up visits as directed by your health care provider.  SEEK MEDICAL CARE IF: Your symptoms do not improve after 1 week of treatment.  SEEK IMMEDIATE MEDICAL CARE IF:  You develop an increased fever or chills.   You have chest pain.   You have severe shortness of breath.  You have bloody sputum.   You develop dehydration.  You faint or repeatedly feel like you are going to pass out.  You develop repeated vomiting.  You develop a severe headache. MAKE SURE YOU:   Understand these instructions.  Will watch your condition.  Will get help right away if you are not doing well or get worse.   This information is not intended to replace advice given to you by your health care provider. Make sure you discuss any questions you have with your health care provider.   Document Released: 10/13/2004 Document Revised: 09/26/2014 Document Reviewed: 02/26/2013 Elsevier Interactive  Patient Education 2016 [employer].

## 2015-08-12 ENCOUNTER — Other Ambulatory Visit: Payer: Self-pay | Admitting: Emergency Medicine

## 2015-08-14 ENCOUNTER — Other Ambulatory Visit: Payer: Self-pay | Admitting: Emergency Medicine

## 2015-09-12 ENCOUNTER — Other Ambulatory Visit: Payer: Self-pay | Admitting: Emergency Medicine

## 2015-09-16 ENCOUNTER — Ambulatory Visit (INDEPENDENT_AMBULATORY_CARE_PROVIDER_SITE_OTHER): Payer: Medicare PPO | Admitting: Emergency Medicine

## 2015-09-16 VITALS — BP 100/62 | HR 76 | Temp 98.2°F | Resp 18 | Ht 68.5 in | Wt 211.0 lb

## 2015-09-16 DIAGNOSIS — I82403 Acute embolism and thrombosis of unspecified deep veins of lower extremity, bilateral: Secondary | ICD-10-CM | POA: Diagnosis not present

## 2015-09-16 LAB — POCT CBC
Granulocyte percent: 60.5 %G (ref 37–80)
HEMATOCRIT: 39.5 % — AB (ref 43.5–53.7)
HEMOGLOBIN: 13.5 g/dL — AB (ref 14.1–18.1)
LYMPH, POC: 2 (ref 0.6–3.4)
MCH, POC: 29.6 pg (ref 27–31.2)
MCHC: 34.2 g/dL (ref 31.8–35.4)
MCV: 86.4 fL (ref 80–97)
MID (cbc): 0.4 (ref 0–0.9)
MPV: 7.9 fL (ref 0–99.8)
PLATELET COUNT, POC: 189 10*3/uL (ref 142–424)
POC GRANULOCYTE: 3.8 (ref 2–6.9)
POC LYMPH %: 33 % (ref 10–50)
POC MID %: 6.5 %M (ref 0–12)
RBC: 4.57 M/uL — AB (ref 4.69–6.13)
RDW, POC: 13.8 %
WBC: 6.2 10*3/uL (ref 4.6–10.2)

## 2015-09-16 LAB — BASIC METABOLIC PANEL WITH GFR
BUN: 23 mg/dL (ref 7–25)
CHLORIDE: 104 mmol/L (ref 98–110)
CO2: 26 mmol/L (ref 20–31)
CREATININE: 1.57 mg/dL — AB (ref 0.70–1.11)
Calcium: 9.9 mg/dL (ref 8.6–10.3)
GFR, EST NON AFRICAN AMERICAN: 41 mL/min — AB (ref >=60)
GFR, Est African American: 47 mL/min — ABNORMAL LOW (ref >=60)
Glucose, Bld: 101 mg/dL — ABNORMAL HIGH (ref 65–99)
Potassium: 5.1 mmol/L (ref 3.5–5.3)
SODIUM: 141 mmol/L (ref 135–146)

## 2015-09-16 MED ORDER — APIXABAN 5 MG PO TABS: ORAL_TABLET | ORAL | Status: DC

## 2015-09-16 NOTE — Progress Notes (Addendum)
Patient ID: Patrick Giles, male   DOB: [birth year], 81 y.o.   MRN: [id number]     By signing my name below, I, Patrick Giles, attest that this documentation has been prepared under the direction and in the presence of Lesle ChrisSteven Arietta Eisenstein, MD.  Electronically Signed: Littie Deedsichard Giles, Medical Scribe. 09/16/2015. 11:11 AM.   Chief Complaint:  Chief Complaint  Patient presents with  . Medication Refill    eliquis     HPI: Patrick Giles is a 81 y.o. male with a history of CAD, HTN, ischemic cardiomyopathy, DVT, and s/p CABG x 3 who [facility] today for a medication refill for Eliquis. He was put on this by Dr. Dareen PianoAnderson for a DVT 1 year ago (November 2015), involving the right femoral, right popliteal, right posterior tibial, right peroneal, right gastrocnemius, and left popliteal veins. He denies history of DVT prior to that and history of PE. He has not had a follow-up ultrasound. Patient sees his cardiologist, Dr. Rennis GoldenHilty, once a year and is due to see him in March. He has had the flu shot this year. He walks 2 miles a day and wears support stockings while walking.  Patient spent the [place], [place] from [place], [place], and [place]. He has 5 children, 9 grandchildren, and 1 great-granddaughter.  Past Medical History  Diagnosis Date  . CAD (coronary artery disease)   . S/P CABG (coronary artery bypass graft) 1998    LIMA to LAD, VG to PDA, VG to first diagonal  . Hypertension   . Dyslipidemia    Past Surgical History  Procedure Laterality Date  . Coronary artery bypass graft  07/08/1997    LIMA to LAD, reverse SVG to PDA, reverse SVG to first diagonal (Dr. Wynetta FinesE. Gerhardt)  . Cholecystectomy  1998  . Transthoracic echocardiogram  11/29/2012    EF 45-50%, grade 1 diastolic dysfunction   Social History   Social History  . Marital Status: Married    Spouse Name: N/A  . Number of Children: 5  . Years of Education: 13   Occupational History  . Retired    Social History  Main Topics  . Smoking status: Former Smoker    Types: Cigars    Quit date: 09/19/1988  . Smokeless tobacco: Never Used  . Alcohol Use: No  . Drug Use: No  . Sexual Activity: Not Asked   Other Topics Concern  . None   Social History Narrative   Family History  Problem Relation Age of Onset  . Emphysema Mother   . Colon cancer Father   . Lung cancer Brother   . Colon polyps Father   . Diabetes Neg Hx   . Kidney disease Neg Hx   . Esophageal cancer Neg Hx    No Known Allergies Prior to Admission medications   Medication Sig Start Date End Date Taking? Authorizing Provider  amLODipine-benazepril (LOTREL) 5-20 MG per capsule Take 1 capsule by mouth daily. 01/12/15  Yes Chrystie NoseKenneth C Hilty, MD  apixaban (ELIQUIS) 5 MG TABS tablet TAKE 2 TABLETS BY MOUTH TWICE A DAY FOR 1 WEEK THEN 1 TABLET TWICE DAILY  "NO MORE REFILLS WITHOUT OFFICE VISIT 09/15/15  Yes Carmelina DaneJeffery S Anderson, MD  atorvastatin (LIPITOR) 20 MG tablet Take 1 tablet (20 mg total) by mouth daily at 6 PM. 12/15/14  Yes Chrystie NoseKenneth C Hilty, MD  BEE POLLEN PO Take 550 mg by mouth daily.    Yes Historical Provider, MD  GARLIC PO Take  1,000 mg by mouth daily.    Yes Historical Provider, MD  metoprolol succinate (TOPROL-XL) 25 MG 24 hr tablet Take 0.5 tablets (12.5 mg total) by mouth daily. 12/15/14  Yes Chrystie Nose, MD  Multiple Vitamins-Minerals (ALIVE MENS ENERGY PO) Take 1 tablet by mouth daily.    Yes Historical Provider, MD  Omega-3 Fatty Acids (FISH OIL) 1200 MG CAPS Take 2 capsules by mouth daily.   Yes Historical Provider, MD     ROS: The patient denies fevers, chills, night sweats, unintentional weight loss, dysuria, hematuria, melena, numbness, weakness, or tingling.   All other systems have been reviewed and were otherwise negative with the exception of those mentioned in the HPI and as above.    PHYSICAL EXAM: Filed Vitals:   09/16/15 0943  BP: 100/62  Pulse: 76  Temp: 98.2 F (36.8 C)  Resp: 18   Body mass  index is 31.61 kg/(m^2).   General: Alert, no acute distress HEENT:  Normocephalic, atraumatic, oropharynx patent. Eye: Nonie Hoyer [facility] Cardiovascular:  Regular rate and rhythm, no rubs murmurs or gallops. Severe varicosities in both legs with stasis changes. Respiratory: Clear to auscultation bilaterally.  No wheezes, rales, or rhonchi.  No cyanosis, no use of accessory musculature Abdominal: No organomegaly, abdomen is soft and non-tender, positive bowel sounds.  No masses. Musculoskeletal: Gait intact. No edema, tenderness Skin: No rashes. Neurologic: Facial musculature symmetric. Psychiatric: Patient acts appropriately throughout our interaction. Lymphatic: No cervical or submandibular lymphadenopathy    LABS:    EKG/XRAY:   Primary read interpreted by Dr. Cleta Alberts at [facility]   ASSESSMENT/PLAN: Patient had diffuse DVTs on his last Doppler. Patient is currently on Eilquis 5 mg twice a day. This may need to be decreased due to his age. Serum creatinine done today. Repeat doppler ordered. Will check with Dr. Rennis Golden regarding his recommendations. Serum creatinine returned elevated at 1.57. Eliquis was decreased to 2.5 mg twice a day.    Gross sideeffects, risk and benefits, and alternatives of medications d/w patient. Patient is aware that all medications have potential sideeffects and we are unable to predict every sideeffect or drug-drug interaction that may occur.  Lesle Chris MD 09/16/2015 11:11 AM

## 2015-09-16 NOTE — Progress Notes (Signed)
Patrick Giles - I agree with repeating his venous dopplers. If the DVT has resolved, I would discontinue the Eliquis. If it persists, he should stay on it. Good idea given age to re-check renal function. Dose adjust accordingly based on age, creatinine (not GFR) and weight.  Please cc: me the doppler results if you can.  Thanks.  -[place]

## 2015-09-17 MED ORDER — APIXABAN 2.5 MG PO TABS: ORAL_TABLET | ORAL | Status: DC

## 2015-09-17 NOTE — Addendum Note (Signed)
Addended by: Lesle ChrisAUB, Bryley Kovacevic A on: 09/17/2015 07:50 AM   Modules accepted: Orders

## 2015-09-22 ENCOUNTER — Ambulatory Visit (HOSPITAL_COMMUNITY)
Admission: RE | Admit: 2015-09-22 | Discharge: 2015-09-22 | Disposition: A | Discharge status: Home / Self Care | Payer: Medicare Other | Source: Ambulatory Visit | Attending: Emergency Medicine | Admitting: Emergency Medicine

## 2015-09-22 DIAGNOSIS — I82403 Acute embolism and thrombosis of unspecified deep veins of lower extremity, bilateral: Secondary | ICD-10-CM | POA: Diagnosis not present

## 2015-09-22 NOTE — Progress Notes (Signed)
*  PRELIMINARY RESULTS* Vascular Ultrasound Lower extremity venous duplex has been completed.  Preliminary findings: Chronic DVT noted in the right popliteal vein. No acute DVT of bilateral LE.  Called results to Dr. Cleta Albertsaub.   Farrel DemarkJill Eunice, RDMS, RVT  09/22/2015, 11:23 AM

## 2015-09-23 ENCOUNTER — Telehealth: Payer: Self-pay | Admitting: Emergency Medicine

## 2015-09-23 NOTE — Telephone Encounter (Signed)
I called and spoke with the patient.Marland Kitchen. He will stop his Eliquis. He will start on 81 mg coated aspirin one a day.

## 2015-10-01 NOTE — Telephone Encounter (Signed)
Erroneous encounter

## 2015-11-22 ENCOUNTER — Ambulatory Visit (INDEPENDENT_AMBULATORY_CARE_PROVIDER_SITE_OTHER): Payer: Medicare Other | Admitting: Family Medicine

## 2015-11-22 ENCOUNTER — Ambulatory Visit (INDEPENDENT_AMBULATORY_CARE_PROVIDER_SITE_OTHER): Payer: Medicare Other

## 2015-11-22 VITALS — BP 122/60 | HR 78 | Temp 98.3°F | Resp 18 | Ht 68.5 in | Wt 210.4 lb

## 2015-11-22 DIAGNOSIS — R05 Cough: Secondary | ICD-10-CM | POA: Diagnosis not present

## 2015-11-22 DIAGNOSIS — J9801 Acute bronchospasm: Secondary | ICD-10-CM

## 2015-11-22 DIAGNOSIS — I255 Ischemic cardiomyopathy: Secondary | ICD-10-CM | POA: Diagnosis not present

## 2015-11-22 DIAGNOSIS — N183 Chronic kidney disease, stage 3 unspecified: Secondary | ICD-10-CM

## 2015-11-22 DIAGNOSIS — I10 Essential (primary) hypertension: Secondary | ICD-10-CM

## 2015-11-22 DIAGNOSIS — J111 Influenza due to unidentified influenza virus with other respiratory manifestations: Secondary | ICD-10-CM

## 2015-11-22 DIAGNOSIS — Z951 Presence of aortocoronary bypass graft: Secondary | ICD-10-CM | POA: Diagnosis not present

## 2015-11-22 DIAGNOSIS — I2583 Coronary atherosclerosis due to lipid rich plaque: Secondary | ICD-10-CM

## 2015-11-22 DIAGNOSIS — I2510 Atherosclerotic heart disease of native coronary artery without angina pectoris: Secondary | ICD-10-CM | POA: Diagnosis not present

## 2015-11-22 DIAGNOSIS — R059 Cough, unspecified: Secondary | ICD-10-CM

## 2015-11-22 LAB — POCT CBC
GRANULOCYTE PERCENT: 57.2 % (ref 37–80)
HEMATOCRIT: 39.3 % — AB (ref 43.5–53.7)
Hemoglobin: 13.5 g/dL — AB (ref 14.1–18.1)
Lymph, poc: 2.1 (ref 0.6–3.4)
MCH, POC: 30 pg (ref 27–31.2)
MCHC: 35.1 g/dL (ref 31.8–35.4)
MCV: 85.4 fL (ref 80–97)
MID (CBC): 0.9 (ref 0–0.9)
MPV: 8.2 fL (ref 0–99.8)
POC GRANULOCYTE: 4.1 (ref 2–6.9)
POC LYMPH %: 30 % (ref 10–50)
POC MID %: 12.8 % — AB (ref 0–12)
Platelet Count, POC: 147 10*3/uL (ref 142–424)
RBC: 4.6 M/uL — AB (ref 4.69–6.13)
RDW, POC: 13.9 %
WBC: 7.1 10*3/uL (ref 4.6–10.2)

## 2015-11-22 LAB — POCT INFLUENZA A/B
Influenza A, POC: NEGATIVE
Influenza B, POC: NEGATIVE

## 2015-11-22 MED ORDER — HYDROCODONE-HOMATROPINE 5-1.5 MG/5ML PO SYRP: 5.0000 mL | ORAL_SOLUTION | Freq: Four times a day (QID) | ORAL | Status: DC | PRN

## 2015-11-22 MED ORDER — ALBUTEROL SULFATE (2.5 MG/3ML) 0.083% IN NEBU
2.5000 mg | INHALATION_SOLUTION | Freq: Once | RESPIRATORY_TRACT | Status: AC
  Administered 2015-11-22: 2.5 mg via RESPIRATORY_TRACT

## 2015-11-22 MED ORDER — OSELTAMIVIR PHOSPHATE 75 MG PO CAPS: 75.0000 mg | ORAL_CAPSULE | Freq: Two times a day (BID) | ORAL | Status: DC

## 2015-11-22 NOTE — Patient Instructions (Addendum)
Because you received an x-ray today, you will receive an invoice from [facility]. Please contact [facility] at [phone] with questions or concerns regarding your invoice. Our billing staff will not be able to assist you with those questions. Influenza, Adult Influenza ("the flu") is a viral infection of the respiratory tract. It occurs more often in winter months because people spend more time in close contact with one another. Influenza can make you feel very sick. Influenza easily spreads from person to person (contagious). CAUSES  Influenza is caused by a virus that infects the respiratory tract. You can catch the virus by breathing in droplets from an infected person's cough or sneeze. You can also catch the virus by touching something that was recently contaminated with the virus and then touching your mouth, nose, or eyes. RISKS AND COMPLICATIONS You may be at risk for a more severe case of influenza if you smoke cigarettes, have diabetes, have chronic heart disease (such as heart failure) or lung disease (such as asthma), or if you have a weakened immune system. Elderly people and pregnant women are also at risk for more serious infections. The most common problem of influenza is a lung infection (pneumonia). Sometimes, this problem can require emergency medical care and may be life threatening. SIGNS AND SYMPTOMS  Symptoms typically last 4 to 10 days and may include:  Fever.  Chills.  Headache, body aches, and muscle aches.  Sore throat.  Chest discomfort and cough.  Poor appetite.  Weakness or feeling tired.  Dizziness.  Nausea or vomiting. DIAGNOSIS  Diagnosis of influenza is often made based on your history and a physical exam. A nose or throat swab test can be done to confirm the diagnosis. TREATMENT  In mild cases, influenza goes away on its own. Treatment is directed at relieving symptoms. For more severe cases, your health care provider may  prescribe antiviral medicines to shorten the sickness. Antibiotic medicines are not effective because the infection is caused by a virus, not by bacteria. HOME CARE INSTRUCTIONS  Take medicines only as directed by your health care provider.  Use a cool mist humidifier to make breathing easier.  Get plenty of rest until your temperature returns to normal. This usually takes 3 to 4 days.  Drink enough fluid to keep your urine clear or pale yellow.  Cover yourmouth and nosewhen coughing or sneezing,and wash your handswellto prevent thevirusfrom spreading.  Stay homefromwork orschool untilthe fever is gonefor at least 1full day. PREVENTION  An annual influenza vaccination (flu shot) is the best way to avoid getting influenza. An annual flu shot is now routinely recommended for all adults in the [place] SEEK MEDICAL CARE IF:  You experiencechest pain, yourcough worsens,or you producemore mucus.  Youhave nausea,vomiting, ordiarrhea.  Your fever returns or gets worse. SEEK IMMEDIATE MEDICAL CARE IF:  You havetrouble breathing, you become short of breath,or your skin ornails becomebluish.  You have severe painor stiffnessin the neck.  You develop a sudden headache, or pain in the face or ear.  You have nausea or vomiting that you cannot control. MAKE SURE YOU:   Understand these instructions.  Will watch your condition.  Will get help right away if you are not doing well or get worse.   This information is not intended to replace advice given to you by your health care provider. Make sure you discuss any questions you have with your health care provider.   Document Released: 09/02/2000 Document Revised: 09/26/2014 Document Reviewed: 12/05/2011 Elsevier Interactive   Patient Education 2016 Elsevier Inc.  

## 2015-11-22 NOTE — Progress Notes (Signed)
Subjective:    Patient ID: Patrick Giles, male    DOB: 03/10/1933, 82 y.o.   MRN: [id number]  11/22/2015  URI   HPI This 82 y.o. male presents for evaluation of cough for three days.  No fever but sweats; mild chills.  +body aches.  No headache.  No sore throat; no ear pain.  +rhinorrhea and nasal congestion.  +coughing; no sputum production; no SOB; +wheezing; no asthma; no tobacco.  No v/d.  S/p flu vaccine.  No sick contacts. Energy level is low; going to bed early.    At last visit, prescribed PCN, hydrocodone cough syrup.  Cough, wheezing.  No pneumonia.  Acute bronchitis.   At most recent visit, Xarelto discontinued for history of DVT. Denies chest pain, orthopnea, DOE, leg swelling, palpitations.  Review of Systems  Constitutional: Positive for chills and diaphoresis. Negative for fever, activity change, appetite change and fatigue.  HENT: Positive for congestion and rhinorrhea. Negative for ear pain, sore throat, trouble swallowing and voice change.   Respiratory: Positive for cough and wheezing. Negative for shortness of breath.   Cardiovascular: Negative for chest pain, palpitations and leg swelling.  Gastrointestinal: Negative for nausea, vomiting, abdominal pain and diarrhea.  Endocrine: Negative for cold intolerance, heat intolerance, polydipsia, polyphagia and polyuria.  Skin: Negative for color change, rash and wound.  Neurological: Negative for dizziness, tremors, seizures, syncope, facial asymmetry, speech difficulty, weakness, light-headedness, numbness and headaches.  Psychiatric/Behavioral: Negative for sleep disturbance and dysphoric mood. The patient is not nervous/anxious.     Past Medical History  Diagnosis Date  . CAD (coronary artery disease)   . S/P CABG (coronary artery bypass graft) 1998    LIMA to LAD, VG to PDA, VG to first diagonal  . Hypertension   . Dyslipidemia    Past Surgical History  Procedure Laterality Date  . Coronary artery bypass graft   07/08/1997    LIMA to LAD, reverse SVG to PDA, reverse SVG to first diagonal (Dr. Wynetta Fines)  . Cholecystectomy  1998  . Transthoracic echocardiogram  11/29/2012    EF 45-50%, grade 1 diastolic dysfunction   No Known Allergies  Social History   Social History  . Marital Status: Married    Spouse Name: N/A  . Number of Children: 5  . Years of Education: 13   Occupational History  . Retired    Social History Main Topics  . Smoking status: Former Smoker    Types: Cigars    Quit date: 09/19/1988  . Smokeless tobacco: Never Used  . Alcohol Use: No  . Drug Use: No  . Sexual Activity: Not on file   Other Topics Concern  . Not on file   Social History Narrative   Family History  Problem Relation Age of Onset  . Emphysema Mother   . Colon cancer Father   . Lung cancer Brother   . Colon polyps Father   . Diabetes Neg Hx   . Kidney disease Neg Hx   . Esophageal cancer Neg Hx        Objective:    BP 122/60 mmHg  Pulse 78  Temp(Src) 98.3 F (36.8 C) (Oral)  Resp 18  Ht 5' 8.5" (1.74 m)  Wt 210 lb 6 oz (95.425 kg)  BMI 31.52 kg/m2  SpO2 97% Physical Exam  Constitutional: He is oriented to person, place, and time. He appears well-developed and well-nourished. No distress.  HENT:  Head: Normocephalic and atraumatic.  Right Ear: Tympanic membrane, external  ear and ear canal normal.  Left Ear: Tympanic membrane, external ear and ear canal normal.  Nose: Mucosal edema and rhinorrhea present. Right sinus exhibits no maxillary sinus tenderness and no frontal sinus tenderness. Left sinus exhibits no maxillary sinus tenderness and no frontal sinus tenderness.  Mouth/Throat: Oropharynx is clear and moist. No oropharyngeal exudate.  Eyes: Conjunctivae and EOM are normal. Pupils are equal, round, and reactive to light.  Neck: Normal range of motion. Neck supple. Carotid bruit is not present. No thyromegaly present.  Cardiovascular: Normal rate, regular rhythm, normal heart  sounds and intact distal pulses.  Exam reveals no gallop and no friction rub.   No murmur heard. Trace pitting edema BLE to distal ankles.  Pulmonary/Chest: Effort normal. He has wheezes. He has rhonchi in the right middle field and the left middle field. He has no rales.  Lymphadenopathy:    He has no cervical adenopathy.  Neurological: He is alert and oriented to person, place, and time. No cranial nerve deficit.  Skin: Skin is warm and dry. No rash noted. He is not diaphoretic.  Psychiatric: He has a normal mood and affect. His behavior is normal.  Nursing note and vitals reviewed.  Results for orders placed or performed in visit on 11/22/15  POCT CBC  Result Value Ref Range   WBC 7.1 4.6 - 10.2 K/uL   Lymph, poc 2.1 0.6 - 3.4   POC LYMPH PERCENT 30.0 10 - 50 %L   MID (cbc) 0.9 0 - 0.9   POC MID % 12.8 (A) 0 - 12 %M   POC Granulocyte 4.1 2 - 6.9   Granulocyte percent 57.2 37 - 80 %G   RBC 4.60 (A) 4.69 - 6.13 M/uL   Hemoglobin 13.5 (A) 14.1 - 18.1 g/dL   HCT, POC 16.1 (A) 09.6 - 53.7 %   MCV 85.4 80 - 97 fL   MCH, POC 30.0 27 - 31.2 pg   MCHC 35.1 31.8 - 35.4 g/dL   RDW, POC 04.5 %   Platelet Count, POC 147 142 - 424 K/uL   MPV 8.2 0 - 99.8 fL  POCT Influenza A/B  Result Value Ref Range   Influenza A, POC Negative Negative   Influenza B, POC Negative Negative   Dg Chest 2 View  11/22/2015  CLINICAL DATA:  Cough EXAM: CHEST  2 VIEW COMPARISON:  06/24/2015 FINDINGS: Previous median sternotomy and CABG procedure. There is marked asymmetric elevation of the left hemidiaphragm. The right lung appears clear. No airspace consolidation identified. IMPRESSION: 1. No acute cardiopulmonary abnormalities. 2. Marked, asymmetric elevation of left hemidiaphragm. Electronically Signed   By: Signa Kell M.D.   On: 11/22/2015 13:39       Assessment & Plan:   1. Influenza   2. Cough   3. Bronchospasm   4. S/P CABG x 3   5. Cardiomyopathy, ischemic   6. Essential hypertension   7.  CKD (chronic kidney disease) stage 3, GFR 30-59 ml/min   8. Coronary artery disease due to lipid rich plaque    -New. -Rx for Tamiflu and hycodan cough syrup. -s/p albuterol nebulizer in office. -recommend rest, fluids, Tylenol for fever. -RTC for acute worsening. -needs Prevnar 13 after acute illness resolves. -call in 5-7 days if no improvement.   -No evidence of volume overload; no suggestion of cardiac etiology to symptoms.  Orders Placed This Encounter  Procedures  . DG Chest 2 View    Standing Status: Future     Number  of Occurrences: 1     Standing Expiration Date: 11/21/2016    Order Specific Question:  Reason for Exam (SYMPTOM  OR DIAGNOSIS REQUIRED)    Answer:  cough, wheezing, body aches, chills    Order Specific Question:  Preferred imaging location?    Answer:  External  . POCT CBC  . POCT Influenza A/B   Meds ordered this encounter  Medications  . aspirin 81 MG tablet    Sig: Take 81 mg by mouth daily.  Marland Kitchen. albuterol (PROVENTIL) (2.5 MG/3ML) 0.083% nebulizer solution 2.5 mg    Sig:   . oseltamivir (TAMIFLU) 75 MG capsule    Sig: Take 1 capsule (75 mg total) by mouth 2 (two) times daily.    Dispense:  10 capsule    Refill:  0  . HYDROcodone-homatropine (HYCODAN) 5-1.5 MG/5ML syrup    Sig: Take 5 mLs by mouth every 6 (six) hours as needed for cough.    Dispense:  180 mL    Refill:  0    No Follow-up on file.    Alison Breeding Paulita FujitaMartin Ahmya Bernick, M.D. [facility]  [place] [address] [place], [place]  [id number] [phone] phone [phone] fax

## 2015-12-09 ENCOUNTER — Ambulatory Visit (INDEPENDENT_AMBULATORY_CARE_PROVIDER_SITE_OTHER): Payer: Medicare Other | Admitting: Internal Medicine

## 2015-12-09 ENCOUNTER — Encounter: Payer: Self-pay | Admitting: Internal Medicine

## 2015-12-09 VITALS — BP 120/68 | HR 62 | Ht 70.0 in | Wt 209.2 lb

## 2015-12-09 DIAGNOSIS — Z951 Presence of aortocoronary bypass graft: Secondary | ICD-10-CM | POA: Diagnosis not present

## 2015-12-09 DIAGNOSIS — I10 Essential (primary) hypertension: Secondary | ICD-10-CM | POA: Diagnosis not present

## 2015-12-09 DIAGNOSIS — N183 Chronic kidney disease, stage 3 unspecified: Secondary | ICD-10-CM

## 2015-12-09 DIAGNOSIS — I255 Ischemic cardiomyopathy: Secondary | ICD-10-CM | POA: Diagnosis not present

## 2015-12-09 MED ORDER — METOPROLOL SUCCINATE ER 25 MG PO TB24: 12.5000 mg | ORAL_TABLET | Freq: Every day | ORAL | Status: DC

## 2015-12-09 MED ORDER — AMLODIPINE BESY-BENAZEPRIL HCL 5-20 MG PO CAPS: 1.0000 | ORAL_CAPSULE | Freq: Every day | ORAL | Status: DC

## 2015-12-09 MED ORDER — ATORVASTATIN CALCIUM 20 MG PO TABS: 20.0000 mg | ORAL_TABLET | Freq: Every day | ORAL | Status: DC

## 2015-12-09 NOTE — Patient Instructions (Signed)
Your physician has requested that you have an echocardiogram @ [address]. Echocardiography is a painless test that uses sound waves to create images of your heart. It provides your doctor with information about the size and shape of your heart and how well your heart's chambers and valves are working. This procedure takes approximately one hour. There are no restrictions for this procedure.  Your physician wants you to follow-up in: 1 year with Dr. Hilty. You will receive a reminder letter in the mail two months in advance. If you don't receive a letter, please call our office to schedule the follow-up appointment.  

## 2015-12-09 NOTE — Progress Notes (Signed)
Patient ID: Patrick Giles, male   DOB: Feb 10, 1933, 82 y.o.   MRN: [id number]    OFFICE NOTE  Chief Complaint:  Follow up   Primary Care Physician: Tally Due, MD  HPI:  Patrick Giles is a 82 year old gentleman, previously followed by Dr. Clarene Duke, with history of CABG in 1998 with a LIMA to LAD, saphenous vein graft to PDA, and vein graft to first diagonal. In 2006, he had a stress test, which showed an EF of about 43% to 44% with a fixed inferoapical defect. That was again present in 2010, suggesting he may have lost a vein graft, possibly to the PDA, and has a fixed defect which looks like scar. Repeat echo in 2014 showed an EF of 45-50% with inferolateral hypokinesis. He is able to walk without any shortness of breath or worsening symptoms. He is on an ACE inhibitor and a B-blocker was added due to CAD and HTN. He has tolerated this well. He recently had a lipid profile last year showing 161, HDL 51, triglycerides 126 and LDL 96. Fasting glucose was 119. Creatinine was  stable at 1.5.  He returns for yearly follow up office visit today. He had been on Eliquis for DVT when seen last year, and has since discontinued this as repeat doppler demonstrated resolution of the clot. DVT was thought to be provoked 2/2 prostate surgery. He has not had any significant interval deterioration of his health. He continues to exercise frequently. He walks two miles every morning. He plays golf 2 times per week. He ha snot had any chest pain with exertion or at rest, he has not had any shortness of breath with exertion or at rest either. He has a small amount of LE edema that he says resolves with use of his compression stockings. He has not had any palpitations. He does complain of intermittent episodes of pain in his left foot that sometimes prevents him from playing golf. Pain resolves with NSAID use.   PMHx:  Past Medical History  Diagnosis Date  . CAD (coronary artery disease)   . S/P CABG (coronary  artery bypass graft) 1998    LIMA to LAD, VG to PDA, VG to first diagonal  . Hypertension   . Dyslipidemia     Past Surgical History  Procedure Laterality Date  . Coronary artery bypass graft  07/08/1997    LIMA to LAD, reverse SVG to PDA, reverse SVG to first diagonal (Dr. Wynetta Fines)  . Cholecystectomy  1998  . Transthoracic echocardiogram  11/29/2012    EF 45-50%, grade 1 diastolic dysfunction    FAMHx:  Family History  Problem Relation Age of Onset  . Emphysema Mother   . Colon cancer Father   . Lung cancer Brother   . Colon polyps Father   . Diabetes Neg Hx   . Kidney disease Neg Hx   . Esophageal cancer Neg Hx     SOCHx:   reports that he quit smoking about 27 years ago. His smoking use included Cigars. He has never used smokeless tobacco. He reports that he does not drink alcohol or use illicit drugs.  ALLERGIES:  No Known Allergies  ROS: Pertinent items are noted in HPI.  HOME MEDS: Current Outpatient Prescriptions  Medication Sig Dispense Refill  . aspirin 81 MG tablet Take 81 mg by mouth daily.    Marland Kitchen atorvastatin (LIPITOR) 20 MG tablet Take 1 tablet (20 mg total) by mouth daily at 6 PM. 30 tablet 11  .  BEE POLLEN PO Take 550 mg by mouth daily.     Marland Kitchen. GARLIC PO Take 1,6101,000 mg by mouth daily.     . metoprolol succinate (TOPROL-XL) 25 MG 24 hr tablet Take 0.5 tablets (12.5 mg total) by mouth daily. 15 tablet 11  . Multiple Vitamins-Minerals (ALIVE MENS ENERGY PO) Take 1 tablet by mouth daily.     . Omega-3 Fatty Acids (FISH OIL) 1200 MG CAPS Take 2 capsules by mouth daily.    Marland Kitchen. amLODipine-benazepril (LOTREL) 5-20 MG capsule Take 1 capsule by mouth daily. 30 capsule 11   No current facility-administered medications for this visit.    LABS/IMAGING: No results found for this or any previous visit (from the past 48 hour(s)). No results found.  WEIGHTS: Wt Readings from Last 3 Encounters:  12/09/15 209 lb 4 oz (94.915 kg)  11/22/15 210 lb 6 oz (95.425 kg)    09/16/15 211 lb (95.709 kg)    VITALS: BP 120/68 mmHg  Pulse 62  Ht 5\' 10"  (1.778 m)  Wt 209 lb 4 oz (94.915 kg)  BMI 30.02 kg/m2  EXAM: General appearance: alert, cooperative and appears stated age Neck: no adenopathy, no JVD, supple, symmetrical, trachea midline and thyroid not enlarged, symmetric, no tenderness/mass/nodules Lungs: clear to auscultation bilaterally Heart: regular rate and rhythm, S1, S2 normal, no murmur, click, rub or gallop Abdomen: soft, non-tender; bowel sounds normal; no masses,  no organomegaly Extremities: edema trace edema, otherwise normal. Pulses: 2+ and symmetric Skin: Skin color, texture, turgor normal. No rashes or lesions Of note, infrequent PVC's / early beats on auscultation.   EKG: Normal sinus rhythm, no notable abnormalities.   ASSESSMENT: 1. CAD s/p CABG x 3 in 1998 2. Ischemic Cardiomyopathy EF 45-50% with inferolateral hypokinesis 3. HTN - well controlled 4. Dyslipidemia - at goal 5. CKD III 6. DVT s/p Eliquis - resolved.   PLAN: 1.   Patrick Giles has come off of his eliquis, as he has completed his treatment course. Repeat LE Doppler was normal.  2. He needs an echocardiogram for monitoring of Ischemic Cardiomyopathy. Pt. Hesitant but agreeable.  3. Needs Stress Test given his history of CAD s/p CABG. Pt. Not agreeable at this time.  4. Continue Lotrel, ASA, Lipitor, Toprol -XL 5. Lipid panel - patient defers to PCP for monitoring.  6. CKD III - Last BMET 08/2015 with stable Cr.  7. F/U in 1 year unless echo abnormal.   Damareon Lanni 12/09/2015, 10:20 AM    Pt. Seen and examined. Agree with the NP/PA-C note as written. Patrick Giles is doing well without any chest pain or worsening shortness of breath. I mentioned to him that it's been a number of years since we last had an echocardiogram in his EF was previously 45%. He is also not had a stress test since his bypass surgery 1998. While he is doing fairly well clinically, he is overdue  for surveillance regarding his cardiomyopathy. I recommend a repeat echocardiogram. He seems to be agreeable to this. In addition I do not see a recent lipid profile. He wishes to obtain that through his primary care provider. We will contact him regarding the results of his echocardiogram and recommend see him back annually or sooner as necessary.  Chrystie NoseKenneth C. Hilty, MD, [facility] Attending Cardiologist [facility]

## 2015-12-25 ENCOUNTER — Other Ambulatory Visit (HOSPITAL_COMMUNITY): Payer: Medicare Other

## 2016-01-07 ENCOUNTER — Other Ambulatory Visit: Payer: Self-pay

## 2016-01-07 ENCOUNTER — Ambulatory Visit (HOSPITAL_COMMUNITY): Payer: Medicare Other | Attending: Cardiology

## 2016-01-07 ENCOUNTER — Other Ambulatory Visit (HOSPITAL_COMMUNITY): Payer: Self-pay | Admitting: *Deleted

## 2016-01-07 DIAGNOSIS — Z951 Presence of aortocoronary bypass graft: Secondary | ICD-10-CM | POA: Diagnosis not present

## 2016-01-07 DIAGNOSIS — E785 Hyperlipidemia, unspecified: Secondary | ICD-10-CM | POA: Insufficient documentation

## 2016-01-07 DIAGNOSIS — I119 Hypertensive heart disease without heart failure: Secondary | ICD-10-CM | POA: Diagnosis not present

## 2016-01-07 DIAGNOSIS — I255 Ischemic cardiomyopathy: Secondary | ICD-10-CM | POA: Diagnosis not present

## 2016-01-07 DIAGNOSIS — I77810 Thoracic aortic ectasia: Secondary | ICD-10-CM | POA: Diagnosis not present

## 2016-01-07 DIAGNOSIS — I2510 Atherosclerotic heart disease of native coronary artery without angina pectoris: Secondary | ICD-10-CM | POA: Diagnosis not present

## 2016-01-07 MED ORDER — PERFLUTREN LIPID MICROSPHERE
1.0000 mL | INTRAVENOUS | Status: AC | PRN
  Administered 2016-01-07: 2 mL via INTRAVENOUS

## 2016-01-11 ENCOUNTER — Telehealth: Payer: Self-pay | Admitting: Internal Medicine

## 2016-01-11 DIAGNOSIS — Z79899 Other long term (current) drug therapy: Secondary | ICD-10-CM

## 2016-01-11 DIAGNOSIS — I77810 Thoracic aortic ectasia: Secondary | ICD-10-CM

## 2016-01-11 NOTE — Telephone Encounter (Signed)
Patient called and notified of results. Verbalized understanding and agreed with plan with follow up CT. Test and BUN/Creatinine ordered. Message routed to NL scheduling pool to arrange study

## 2016-01-11 NOTE — Telephone Encounter (Signed)
Pt returning call,he thinks it might be about his test results.

## 2016-01-12 LAB — BUN: BUN: 23 mg/dL (ref 7–25)

## 2016-01-12 LAB — CREATININE, SERUM: CREATININE: 1.83 mg/dL — AB (ref 0.70–1.11)

## 2016-01-14 ENCOUNTER — Other Ambulatory Visit: Payer: Self-pay | Admitting: *Deleted

## 2016-01-14 ENCOUNTER — Ambulatory Visit (INDEPENDENT_AMBULATORY_CARE_PROVIDER_SITE_OTHER)
Admission: RE | Admit: 2016-01-14 | Discharge: 2016-01-14 | Disposition: A | Discharge status: Home / Self Care | Payer: Medicare Other | Source: Ambulatory Visit | Attending: Internal Medicine | Admitting: Internal Medicine

## 2016-01-14 DIAGNOSIS — I77810 Thoracic aortic ectasia: Secondary | ICD-10-CM | POA: Diagnosis not present

## 2016-01-14 MED ORDER — IOPAMIDOL (ISOVUE-370) INJECTION 76%
50.0000 mL | Freq: Once | INTRAVENOUS | Status: AC | PRN
  Administered 2016-01-14: 50 mL via INTRAVENOUS

## 2016-01-20 ENCOUNTER — Telehealth: Payer: Self-pay | Admitting: Internal Medicine

## 2016-01-20 NOTE — Telephone Encounter (Signed)
Spoke with pt wife, aware of CT results.  The pt does not have my chart anymore. Aware will repeat CT scan again in one year.

## 2016-01-20 NOTE — Telephone Encounter (Signed)
New message   The pt's is wanting to speak with the doctor about the CAT Scan results that was done last week please return the phone call back to home number provided.

## 2016-12-08 ENCOUNTER — Other Ambulatory Visit: Payer: Self-pay | Admitting: Internal Medicine

## 2017-01-05 ENCOUNTER — Other Ambulatory Visit: Payer: Self-pay | Admitting: Internal Medicine

## 2017-01-17 ENCOUNTER — Other Ambulatory Visit: Payer: Self-pay | Admitting: *Deleted

## 2017-01-17 ENCOUNTER — Other Ambulatory Visit: Payer: Self-pay

## 2017-01-17 MED ORDER — AMLODIPINE BESY-BENAZEPRIL HCL 5-20 MG PO CAPS: 1.0000 | ORAL_CAPSULE | Freq: Every day | ORAL | 0 refills | Status: DC

## 2017-01-18 ENCOUNTER — Ambulatory Visit (INDEPENDENT_AMBULATORY_CARE_PROVIDER_SITE_OTHER): Payer: Medicare Other | Admitting: Internal Medicine

## 2017-01-18 ENCOUNTER — Encounter: Payer: Self-pay | Admitting: Internal Medicine

## 2017-01-18 VITALS — BP 140/76 | HR 59 | Ht 70.0 in | Wt 211.2 lb

## 2017-01-18 DIAGNOSIS — I2510 Atherosclerotic heart disease of native coronary artery without angina pectoris: Secondary | ICD-10-CM

## 2017-01-18 DIAGNOSIS — I2583 Coronary atherosclerosis due to lipid rich plaque: Secondary | ICD-10-CM

## 2017-01-18 DIAGNOSIS — I77810 Thoracic aortic ectasia: Secondary | ICD-10-CM | POA: Insufficient documentation

## 2017-01-18 DIAGNOSIS — Z951 Presence of aortocoronary bypass graft: Secondary | ICD-10-CM

## 2017-01-18 DIAGNOSIS — E785 Hyperlipidemia, unspecified: Secondary | ICD-10-CM

## 2017-01-18 NOTE — Progress Notes (Signed)
OFFICE NOTE  Chief Complaint:  Routine follow-up  Primary Care Physician: Tally Due, MD  HPI:  Patrick Giles is a 83 year old gentleman, previously followed by Dr. Clarene Duke, with history of CABG in 1998 with a LIMA to LAD, saphenous vein graft to PDA, and vein graft to first diagonal. In 2006, he had a stress test, which showed an EF of about 43% to 44% with a fixed inferoapical defect. That was again present in 2010, suggesting he may have lost a vein graft, possibly to the PDA, and has a fixed defect which looks like scar. The recent, repeat echo in 2014 showed an EF of 45-50% with inferolateral hypokinesis. He is able to walk without any shortness of breath or worsening symptoms. He is on an ACE inhibitor and a B-blocker was added due to CAD and HTN.  He has tolerated this well.  He recently had a lipid profile showing total cholesterol 161, HDL 51, triglycerides 126 and LDL 96. Fasting glucose was 119. Creatinine is stable at 1.5.  Patrick Giles returns today for follow-up. In November he was noted to have some blood in the urine and underwent transurethral resection of the prostate. Subsequent to that he was found to have DVT and this may or may not be related to his surgery. He was placed on Eliquis which she continues to take. This is followed by his primary care provider. He denies any worsening chest pain or shortness of breath.   01/18/2017  Patrick Giles returns today for follow-up. Overall he feels well denies any chest pain or worsening shortness of breath. When we last saw him he had repeat echo which showed EF around 45%. He is on appropriate medical therapy. Weight is stable if not decreased somewhat. He remains physically active and plays golf a couple times a week. 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 He is originally from the [place] area of [place]. When we last saw him his echo  demonstrated a dilated aortic root up to about 4.9 cm. He had CT angiogram of the aorta which reevaluated that. He's overdue for follow-up on that aneurysm. He's not had any blood work recently either.  PMHx:  Past Medical History:  Diagnosis Date  . CAD (coronary artery disease)   . Dyslipidemia   . Hypertension   . S/P CABG (coronary artery bypass graft) 1998   LIMA to LAD, VG to PDA, VG to first diagonal    Past Surgical History:  Procedure Laterality Date  . CHOLECYSTECTOMY  1998  . CORONARY ARTERY BYPASS GRAFT  07/08/1997   LIMA to LAD, reverse SVG to PDA, reverse SVG to first diagonal (Dr. Wynetta Fines)  . TRANSTHORACIC ECHOCARDIOGRAM  11/29/2012   EF 45-50%, grade 1 diastolic dysfunction    FAMHx:  Family History  Problem Relation Age of Onset  . Emphysema Mother   . Colon cancer Father   . Colon polyps Father   . Lung cancer Brother   . Diabetes Neg Hx   . Kidney disease Neg Hx   . Esophageal cancer Neg Hx     SOCHx:   reports that he quit smoking about 28 years ago. His smoking use included Cigars. He has never used smokeless tobacco. He reports that he does not drink alcohol or use drugs.  ALLERGIES:  No Known Allergies  ROS: Pertinent items noted in HPI and remainder of comprehensive ROS otherwise  negative.  HOME MEDS: Current Outpatient Prescriptions  Medication Sig Dispense Refill  . amLODipine-benazepril (LOTREL) 5-20 MG capsule Take 1 capsule by mouth daily. KEEP OV. 90 capsule 0  . aspirin 81 MG tablet Take 81 mg by mouth daily.    Marland Kitchen atorvastatin (LIPITOR) 20 MG tablet TAKE 1 TABLET (20 MG TOTAL) BY MOUTH DAILY AT 6 PM. 90 tablet 0  . BEE POLLEN PO Take 550 mg by mouth daily.     Marland Kitchen GARLIC PO Take 4,540 mg by mouth daily.     . metoprolol succinate (TOPROL-XL) 25 MG 24 hr tablet TAKE 1/2 TABLET EVERY DAY 15 tablet 0  . Multiple Vitamins-Minerals (ALIVE MENS ENERGY PO) Take 1 tablet by mouth daily.     . Omega-3 Fatty Acids (FISH OIL) 1200 MG CAPS Take  2 capsules by mouth daily.     No current facility-administered medications for this visit.     LABS/IMAGING: No results found for this or any previous visit (from the past 48 hour(s)). No results found.  VITALS: BP 140/76   Pulse (!) 59   Ht  (1.778 m)   Wt 211 lb 3.2 oz (95.8 kg)   BMI 30.30 kg/m   EXAM: General appearance: alert and no distress Neck: no carotid bruit and no JVD Lungs: clear to auscultation bilaterally Heart: regular rate and rhythm, S1, S2 normal, no murmur, click, rub or gallop Abdomen: soft, non-tender; bowel sounds normal; no masses,  no organomegaly Extremities: extremities normal, atraumatic, no cyanosis or edema Pulses: 2+ and symmetric Skin: Skin color, texture, turgor normal. No rashes or lesions Neurologic: Grossly normal Psych: Mood, affect normal  EKG: Normal sinus rhythm at 81  ASSESSMENT: 1. Coronary artery disease status post three-vessel CABG in 1998 2. Ischemic cardio myopathy EF 45-50%, with inferolateral hypokinesis 3. Hypertension-controlled 4. Dyslipidemia-at goal 5. CKD 3 6. H/o DVT - s/p 6 months of Eliquis 7. Aortic root aneurysm - measured up to 4.9 cm (2016)  PLAN: 1.   Mr. Ketchum had an aortic root aneurysm up to 4.9 cm on CT a in 2016. He's due for repeat study. We'll need to get another comprehensive metabolic profile and fasting lipid profile. He does have a history of dyslipidemia and is on a atorvastatin. Blood pressure appears well controlled on Lotrel, metoprolol and he is on low-dose aspirin. He reports class I NYHA symptoms. He is not a candidate for Entresto based on this symptom pattern.  We'll contact him with the results of his CT scan and continue to follow him clinically. He'll likely need a repeat echocardiogram next year.  Chrystie Nose, MD, [facility] Attending Cardiologist [facility]  Chrystie Nose 01/18/2017, 10:35 AM

## 2017-01-18 NOTE — Patient Instructions (Signed)
Medication Instructions:  Your physician recommends that you continue on your current medications as directed. Please refer to the Current Medication list given to you today.  Labwork: Please return for FASTING blood work (BMET, Lipid panel) prior to CT scan. [address]  Testing/Procedures: Non-Cardiac CT Angiography (CTA), is a special type of CT scan that uses a computer to produce multi-dimensional views of major blood vessels throughout the body. In CT angiography, a contrast material is injected through an IV to help visualize the blood vessels  Follow-Up: Your physician wants you to follow-up in: 1 YEAR with Dr. Rennis Golden. You will receive a reminder letter in the mail two months in advance. If you don't receive a letter, please call our office to schedule the follow-up appointment.   Any Other Special Instructions Will Be Listed Below (If Applicable).     If you need a refill on your cardiac medications before your next appointment, please call your pharmacy.

## 2017-01-25 ENCOUNTER — Telehealth: Payer: Self-pay | Admitting: Internal Medicine

## 2017-01-25 NOTE — Telephone Encounter (Signed)
New message    Pt wife is calling about scheduling a CT scan.

## 2017-01-25 NOTE — Telephone Encounter (Signed)
Routed to scheduling to address - order should be visible in system.

## 2017-02-02 ENCOUNTER — Other Ambulatory Visit: Payer: Medicare Other

## 2017-02-03 ENCOUNTER — Other Ambulatory Visit: Payer: Medicare Other

## 2017-02-04 LAB — LIPID PANEL
CHOLESTEROL TOTAL: 157 mg/dL (ref 100–199)
Chol/HDL Ratio: 3.1 ratio (ref 0.0–5.0)
HDL: 51 mg/dL (ref >39)
LDL Calculated: 86 mg/dL (ref 0–99)
TRIGLYCERIDES: 100 mg/dL (ref 0–149)
VLDL Cholesterol Cal: 20 mg/dL (ref 5–40)

## 2017-02-04 LAB — BASIC METABOLIC PANEL
BUN/Creatinine Ratio: 13 (ref 10–24)
BUN: 22 mg/dL (ref 8–27)
CO2: 23 mmol/L (ref 18–29)
Calcium: 9.9 mg/dL (ref 8.6–10.2)
Chloride: 102 mmol/L (ref 96–106)
Creatinine, Ser: 1.73 mg/dL — ABNORMAL HIGH (ref 0.76–1.27)
GFR calc Af Amer: 42 mL/min/{1.73_m2} — ABNORMAL LOW (ref >59)
GFR calc non Af Amer: 36 mL/min/{1.73_m2} — ABNORMAL LOW (ref >59)
GLUCOSE: 119 mg/dL — AB (ref 65–99)
POTASSIUM: 4.9 mmol/L (ref 3.5–5.2)
SODIUM: 142 mmol/L (ref 134–144)

## 2017-02-05 ENCOUNTER — Other Ambulatory Visit: Payer: Self-pay | Admitting: Internal Medicine

## 2017-02-06 ENCOUNTER — Telehealth: Payer: Self-pay | Admitting: Internal Medicine

## 2017-02-06 ENCOUNTER — Ambulatory Visit (INDEPENDENT_AMBULATORY_CARE_PROVIDER_SITE_OTHER)
Admission: RE | Admit: 2017-02-06 | Discharge: 2017-02-06 | Disposition: A | Discharge status: Home / Self Care | Payer: Medicare Other | Source: Ambulatory Visit | Attending: Internal Medicine | Admitting: Internal Medicine

## 2017-02-06 DIAGNOSIS — I77810 Thoracic aortic ectasia: Secondary | ICD-10-CM | POA: Diagnosis not present

## 2017-02-06 MED ORDER — ATORVASTATIN CALCIUM 40 MG PO TABS: 40.0000 mg | ORAL_TABLET | Freq: Every day | ORAL | 3 refills | Status: DC

## 2017-02-06 MED ORDER — IOPAMIDOL (ISOVUE-370) INJECTION 76%
80.0000 mL | Freq: Once | INTRAVENOUS | Status: AC | PRN
  Administered 2017-02-06: 80 mL via INTRAVENOUS

## 2017-02-06 NOTE — Telephone Encounter (Signed)
Rx request sent to pharmacy.  

## 2017-02-06 NOTE — Telephone Encounter (Signed)
Notes recorded by Chrystie NoseHilty, Kenneth C, MD on 02/06/2017 at 9:36 AM EDT Stable BMET - cholesterol not at goal LDL <70. Can he increase lipitor to 40 mg QHS.  Dr. Rexene EdisonH   Patient aware of results. Agrees w/plan. Rx(s) sent to pharmacy electronically.

## 2017-05-10 ENCOUNTER — Other Ambulatory Visit: Payer: Self-pay | Admitting: Internal Medicine

## 2017-08-29 ENCOUNTER — Other Ambulatory Visit: Payer: Self-pay | Admitting: Internal Medicine

## 2017-08-30 NOTE — Telephone Encounter (Signed)
Rx(s) sent to pharmacy electronically.  

## 2018-01-18 ENCOUNTER — Ambulatory Visit: Payer: Medicare Other | Admitting: Internal Medicine

## 2018-01-18 ENCOUNTER — Encounter: Payer: Self-pay | Admitting: Internal Medicine

## 2018-01-18 VITALS — BP 152/62 | HR 56 | Ht 71.5 in | Wt 208.0 lb

## 2018-01-18 DIAGNOSIS — E785 Hyperlipidemia, unspecified: Secondary | ICD-10-CM | POA: Diagnosis not present

## 2018-01-18 DIAGNOSIS — I712 Thoracic aortic aneurysm, without rupture, unspecified: Secondary | ICD-10-CM

## 2018-01-18 DIAGNOSIS — I77810 Thoracic aortic ectasia: Secondary | ICD-10-CM

## 2018-01-18 DIAGNOSIS — Z951 Presence of aortocoronary bypass graft: Secondary | ICD-10-CM

## 2018-01-18 LAB — LIPID PANEL
Chol/HDL Ratio: 2.7 ratio (ref 0.0–5.0)
Cholesterol, Total: 147 mg/dL (ref 100–199)
HDL: 54 mg/dL (ref >39)
LDL CALC: 63 mg/dL (ref 0–99)
Triglycerides: 148 mg/dL (ref 0–149)
VLDL CHOLESTEROL CAL: 30 mg/dL (ref 5–40)

## 2018-01-18 LAB — BASIC METABOLIC PANEL
BUN/Creatinine Ratio: 14 (ref 10–24)
BUN: 25 mg/dL (ref 8–27)
CO2: 23 mmol/L (ref 20–29)
CREATININE: 1.74 mg/dL — AB (ref 0.76–1.27)
Calcium: 9.8 mg/dL (ref 8.6–10.2)
Chloride: 104 mmol/L (ref 96–106)
GFR calc Af Amer: 41 mL/min/{1.73_m2} — ABNORMAL LOW (ref >59)
GFR calc non Af Amer: 35 mL/min/{1.73_m2} — ABNORMAL LOW (ref >59)
GLUCOSE: 126 mg/dL — AB (ref 65–99)
POTASSIUM: 5.1 mmol/L (ref 3.5–5.2)
SODIUM: 140 mmol/L (ref 134–144)

## 2018-01-18 NOTE — Patient Instructions (Signed)
Medication Instructions: Your physician recommends that you continue on your current medications as directed. Please refer to the Current Medication list given to you today.  If you need a refill on your cardiac medications before your next appointment, please call your pharmacy.   Labwork: Your provider would like for you to have the following labs today: FASTING Lipid and Bmet   Procedures/Testing: Your provider has requested that you have a Chest CT of the Aorta. This can be done at [facility]. A map has been provided.  Follow-Up: Your physician wants you to follow-up in 12 months with Dr. Rennis Golden. You will receive a reminder letter in the mail two months in advance. If you don't receive a letter, please call our office at [phone] to schedule this follow-up appointment.   Thank you for choosing [facility] at [facility]!!

## 2018-01-18 NOTE — Progress Notes (Signed)
OFFICE NOTE  Chief Complaint:  Routine follow-up, no complaints  Primary Care Physician: Jonita Albee, MD  HPI:  Patrick Giles is a 84 year old gentleman, previously followed by Dr. Clarene Duke, with history of CABG in 1998 with a LIMA to LAD, saphenous vein graft to PDA, and vein graft to first diagonal. In 2006, he had a stress test, which showed an EF of about 43% to 44% with a fixed inferoapical defect. That was again present in 2010, suggesting he may have lost a vein graft, possibly to the PDA, and has a fixed defect which looks like scar. The recent, repeat echo in 2014 showed an EF of 45-50% with inferolateral hypokinesis. He is able to walk without any shortness of breath or worsening symptoms. He is on an ACE inhibitor and a B-blocker was added due to CAD and HTN.  He has tolerated this well.  He recently had a lipid profile showing total cholesterol 161, HDL 51, triglycerides 126 and LDL 96. Fasting glucose was 119. Creatinine is stable at 1.5.  12/09/2015  Patrick Giles returns today for follow-up. In November he was noted to have some blood in the urine and underwent transurethral resection of the prostate. Subsequent to that he was found to have DVT and this may or may not be related to his surgery. He was placed on Eliquis which she continues to take. This is followed by his primary care provider. He denies any worsening chest pain or shortness of breath.   01/18/2017  Patrick Giles returns today for follow-up. Overall he feels well denies any chest pain or worsening shortness of breath. When we last saw him he had repeat echo which showed EF around 45%. He is on appropriate medical therapy. Weight is stable if not decreased somewhat. He remains physically active and plays golf a couple times a week. 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 He is originally from the [place] area of [place]. When we last  saw him his echo demonstrated a dilated aortic root up to about 4.9 cm. He had CT angiogram of the aorta which reevaluated that. He's overdue for follow-up on that aneurysm. He's not had any blood work recently either.  01/18/2018  Patrick Giles was seen today in follow-up.  He is generally without complaints.  He denies chest pain or worsening shortness of breath.  Is been a year since I last saw him.  We are following him for his coronary disease as well as aortopathy.  Last year his CT scan showed a stable aortic root size of 4.9 cm.  It is not yet reach significance for surgery.  We will plan a repeat CT to evaluate this.  I also increase his Lipitor to reach goal LDL less than 70 and he will need repeat lab work for that.  LVEF was around 45%.  He continues to be active and plays golf several times a week.  PMHx:  Past Medical History:  Diagnosis Date  . CAD (coronary artery disease)   . Dyslipidemia   . Hypertension   . S/P CABG (coronary artery bypass graft) 1998   LIMA to LAD, VG to PDA, VG to first diagonal    Past Surgical History:  Procedure Laterality Date  . CHOLECYSTECTOMY  1998  . CORONARY ARTERY BYPASS GRAFT  07/08/1997   LIMA to LAD, reverse SVG to PDA, reverse SVG to first diagonal (Dr. Bea Laura.  [place])  . TRANSTHORACIC ECHOCARDIOGRAM  11/29/2012   EF 45-50%, grade 1 diastolic dysfunction    FAMHx:  Family History  Problem Relation Age of Onset  . Emphysema Mother   . Colon cancer Father   . Colon polyps Father   . Lung cancer Brother   . Diabetes Neg Hx   . Kidney disease Neg Hx   . Esophageal cancer Neg Hx     SOCHx:   reports that he quit smoking about 29 years ago. His smoking use included cigars. He has never used smokeless tobacco. He reports that he does not drink alcohol or use drugs.  ALLERGIES:  No Known Allergies  ROS: Pertinent items noted in HPI and remainder of comprehensive ROS otherwise negative.  HOME MEDS: Current Outpatient Medications    Medication Sig Dispense Refill  . amLODipine-benazepril (LOTREL) 5-20 MG capsule TAKE 1 CAPSULE BY MOUTH DAILY. KEEP OV. 90 capsule 3  . aspirin 81 MG tablet Take 81 mg by mouth daily.    Marland Kitchen atorvastatin (LIPITOR) 40 MG tablet Take 1 tablet (40 mg total) by mouth daily at 6 PM. 90 tablet 3  . BEE POLLEN PO Take 550 mg by mouth daily.     Marland Kitchen GARLIC PO Take 1,610 mg by mouth daily.     . metoprolol succinate (TOPROL-XL) 25 MG 24 hr tablet TAKE 1/2 A TABLET BY MOUTH EVERY DAY 15 tablet 6  . Multiple Vitamins-Minerals (ALIVE MENS ENERGY PO) Take 1 tablet by mouth daily.     . Omega-3 Fatty Acids (FISH OIL) 1200 MG CAPS Take 2 capsules by mouth daily.     No current facility-administered medications for this visit.     LABS/IMAGING: No results found for this or any previous visit (from the past 48 hour(s)). No results found.  VITALS: BP (!) 152/62   Pulse (!) 56   Ht 5' 11.5" (1.816 m)   Wt 208 lb (94.3 kg)   BMI 28.61 kg/m   EXAM: General appearance: alert and no distress Neck: no carotid bruit and no JVD Lungs: clear to auscultation bilaterally Heart: regular rate and rhythm, S1, S2 normal, no murmur, click, rub or gallop Abdomen: soft, non-tender; bowel sounds normal; no masses,  no organomegaly Extremities: extremities normal, atraumatic, no cyanosis or edema Pulses: 2+ and symmetric Skin: Skin color, texture, turgor normal. No rashes or lesions Neurologic: Grossly normal Psych: Mood, affect normal  EKG: Sinus bradycardia with PACs a 56-personally reviewed  ASSESSMENT: 1. Coronary artery disease status post three-vessel CABG in 1998 2. Ischemic cardio myopathy EF 45-50%, with inferolateral hypokinesis 3. Hypertension-controlled 4. Dyslipidemia-at goal 5. CKD 3 6. H/o DVT - s/p 6 months of Eliquis 7. Aortic root aneurysm - measured up to 4.9 cm (2016)  PLAN: 1.   Patrick Giles is due for repeat CT scan to monitor his aortic aneurysm.  He denies any worsening shortness of  breath and there is no reason to think his cardiomyopathy has worsened.  He denies any chest pain.  His bypass grafts are now over 64 years old.  His blood pressure is at goal.  His cholesterol should be lower now that we increase his atorvastatin to 40 mg.  We will recheck labs as well.  Plan to see him annually or sooner as necessary.  Chrystie Nose, MD, [facility], FACP  [place]  [facility]  Medical Director of the [facility]  [facility] Diplomate of the American Board of Clinical Lipidology Attending  Cardiologist  Direct Dial: [phone]  Fax: [phone]  Website:  [email or web address] 01/18/2018, 9:45 AM

## 2018-02-01 ENCOUNTER — Ambulatory Visit
Admission: RE | Admit: 2018-02-01 | Discharge: 2018-02-01 | Disposition: A | Discharge status: Home / Self Care | Payer: Medicare Other | Source: Ambulatory Visit | Attending: Internal Medicine | Admitting: Internal Medicine

## 2018-02-01 MED ORDER — IOPAMIDOL (ISOVUE-370) INJECTION 76%
40.0000 mL | Freq: Once | INTRAVENOUS | Status: AC | PRN
  Administered 2018-02-01: 40 mL via INTRAVENOUS

## 2018-02-06 ENCOUNTER — Other Ambulatory Visit: Payer: Self-pay | Admitting: *Deleted

## 2018-02-06 DIAGNOSIS — I712 Thoracic aortic aneurysm, without rupture, unspecified: Secondary | ICD-10-CM

## 2018-02-18 ENCOUNTER — Other Ambulatory Visit: Payer: Self-pay | Admitting: Internal Medicine

## 2018-02-19 NOTE — Telephone Encounter (Signed)
Rx sent to pharmacy   

## 2018-04-02 ENCOUNTER — Other Ambulatory Visit: Payer: Self-pay | Admitting: Internal Medicine

## 2018-05-04 ENCOUNTER — Other Ambulatory Visit: Payer: Self-pay | Admitting: Internal Medicine

## 2018-06-18 ENCOUNTER — Emergency Department (HOSPITAL_COMMUNITY): Payer: Medicare Other

## 2018-06-18 ENCOUNTER — Observation Stay (HOSPITAL_COMMUNITY): Payer: Medicare Other

## 2018-06-18 ENCOUNTER — Observation Stay (HOSPITAL_BASED_OUTPATIENT_CLINIC_OR_DEPARTMENT_OTHER): Payer: Medicare Other

## 2018-06-18 ENCOUNTER — Other Ambulatory Visit: Payer: Self-pay

## 2018-06-18 ENCOUNTER — Inpatient Hospital Stay (HOSPITAL_COMMUNITY)
Admission: EM | Admit: 2018-06-18 | Discharge: 2018-06-19 | DRG: 065 | Disposition: A | Discharge status: Home / Self Care | Payer: Medicare Other | Attending: Internal Medicine | Admitting: Internal Medicine

## 2018-06-18 ENCOUNTER — Encounter (HOSPITAL_COMMUNITY): Payer: Self-pay | Admitting: Emergency Medicine

## 2018-06-18 DIAGNOSIS — G8191 Hemiplegia, unspecified affecting right dominant side: Secondary | ICD-10-CM | POA: Diagnosis present

## 2018-06-18 DIAGNOSIS — M6281 Muscle weakness (generalized): Secondary | ICD-10-CM

## 2018-06-18 DIAGNOSIS — Z7902 Long term (current) use of antithrombotics/antiplatelets: Secondary | ICD-10-CM

## 2018-06-18 DIAGNOSIS — I712 Thoracic aortic aneurysm, without rupture: Secondary | ICD-10-CM | POA: Diagnosis present

## 2018-06-18 DIAGNOSIS — N183 Chronic kidney disease, stage 3 unspecified: Secondary | ICD-10-CM | POA: Diagnosis present

## 2018-06-18 DIAGNOSIS — E669 Obesity, unspecified: Secondary | ICD-10-CM | POA: Diagnosis present

## 2018-06-18 DIAGNOSIS — I129 Hypertensive chronic kidney disease with stage 1 through stage 4 chronic kidney disease, or unspecified chronic kidney disease: Secondary | ICD-10-CM | POA: Diagnosis present

## 2018-06-18 DIAGNOSIS — N1832 Chronic kidney disease, stage 3b: Secondary | ICD-10-CM | POA: Diagnosis present

## 2018-06-18 DIAGNOSIS — Z87891 Personal history of nicotine dependence: Secondary | ICD-10-CM

## 2018-06-18 DIAGNOSIS — I2510 Atherosclerotic heart disease of native coronary artery without angina pectoris: Secondary | ICD-10-CM | POA: Diagnosis present

## 2018-06-18 DIAGNOSIS — Z79899 Other long term (current) drug therapy: Secondary | ICD-10-CM

## 2018-06-18 DIAGNOSIS — G459 Transient cerebral ischemic attack, unspecified: Secondary | ICD-10-CM | POA: Diagnosis not present

## 2018-06-18 DIAGNOSIS — Z86718 Personal history of other venous thrombosis and embolism: Secondary | ICD-10-CM

## 2018-06-18 DIAGNOSIS — I6529 Occlusion and stenosis of unspecified carotid artery: Secondary | ICD-10-CM | POA: Diagnosis not present

## 2018-06-18 DIAGNOSIS — I10 Essential (primary) hypertension: Secondary | ICD-10-CM | POA: Diagnosis not present

## 2018-06-18 DIAGNOSIS — R531 Weakness: Secondary | ICD-10-CM | POA: Diagnosis not present

## 2018-06-18 DIAGNOSIS — R29701 NIHSS score 1: Secondary | ICD-10-CM | POA: Diagnosis present

## 2018-06-18 DIAGNOSIS — I639 Cerebral infarction, unspecified: Principal | ICD-10-CM | POA: Diagnosis present

## 2018-06-18 DIAGNOSIS — E785 Hyperlipidemia, unspecified: Secondary | ICD-10-CM | POA: Diagnosis present

## 2018-06-18 DIAGNOSIS — Z7982 Long term (current) use of aspirin: Secondary | ICD-10-CM

## 2018-06-18 DIAGNOSIS — Z6829 Body mass index (BMI) 29.0-29.9, adult: Secondary | ICD-10-CM

## 2018-06-18 DIAGNOSIS — Z951 Presence of aortocoronary bypass graft: Secondary | ICD-10-CM

## 2018-06-18 LAB — COMPREHENSIVE METABOLIC PANEL
ALBUMIN: 3.8 g/dL (ref 3.5–5.0)
ALT: 20 U/L (ref 0–44)
AST: 26 U/L (ref 15–41)
Alkaline Phosphatase: 50 U/L (ref 38–126)
Anion gap: 8 (ref 5–15)
BILIRUBIN TOTAL: 0.8 mg/dL (ref 0.3–1.2)
BUN: 23 mg/dL (ref 8–23)
CALCIUM: 9.8 mg/dL (ref 8.9–10.3)
CO2: 26 mmol/L (ref 22–32)
Chloride: 105 mmol/L (ref 98–111)
Creatinine, Ser: 1.65 mg/dL — ABNORMAL HIGH (ref 0.61–1.24)
GFR calc Af Amer: 43 mL/min — ABNORMAL LOW (ref >60)
GFR calc non Af Amer: 37 mL/min — ABNORMAL LOW (ref >60)
GLUCOSE: 129 mg/dL — AB (ref 70–99)
POTASSIUM: 4.7 mmol/L (ref 3.5–5.1)
Sodium: 139 mmol/L (ref 135–145)
TOTAL PROTEIN: 7.1 g/dL (ref 6.5–8.1)

## 2018-06-18 LAB — DIFFERENTIAL
Abs Immature Granulocytes: 0 10*3/uL (ref 0.0–0.1)
Basophils Absolute: 0.1 10*3/uL (ref 0.0–0.1)
Basophils Relative: 1 %
Eosinophils Absolute: 0.1 10*3/uL (ref 0.0–0.7)
Eosinophils Relative: 1 %
IMMATURE GRANULOCYTES: 0 %
LYMPHS ABS: 1.6 10*3/uL (ref 0.7–4.0)
LYMPHS PCT: 22 %
Monocytes Absolute: 0.9 10*3/uL (ref 0.1–1.0)
Monocytes Relative: 12 %
NEUTROS ABS: 4.5 10*3/uL (ref 1.7–7.7)
Neutrophils Relative %: 64 %

## 2018-06-18 LAB — I-STAT CHEM 8, ED
BUN: 32 mg/dL — ABNORMAL HIGH (ref 8–23)
CALCIUM ION: 1.22 mmol/L (ref 1.15–1.40)
CHLORIDE: 105 mmol/L (ref 98–111)
Creatinine, Ser: 1.7 mg/dL — ABNORMAL HIGH (ref 0.61–1.24)
Glucose, Bld: 127 mg/dL — ABNORMAL HIGH (ref 70–99)
HCT: 41 % (ref 39.0–52.0)
Hemoglobin: 13.9 g/dL (ref 13.0–17.0)
POTASSIUM: 4.7 mmol/L (ref 3.5–5.1)
SODIUM: 140 mmol/L (ref 135–145)
TCO2: 29 mmol/L (ref 22–32)

## 2018-06-18 LAB — ECHOCARDIOGRAM COMPLETE
HEIGHTINCHES: 71 in
Weight: 3326.3 oz

## 2018-06-18 LAB — CBC
HEMATOCRIT: 42 % (ref 39.0–52.0)
HEMOGLOBIN: 13.6 g/dL (ref 13.0–17.0)
MCH: 30.4 pg (ref 26.0–34.0)
MCHC: 32.4 g/dL (ref 30.0–36.0)
MCV: 93.8 fL (ref 78.0–100.0)
Platelets: 169 10*3/uL (ref 150–400)
RBC: 4.48 MIL/uL (ref 4.22–5.81)
RDW: 13 % (ref 11.5–15.5)
WBC: 7 10*3/uL (ref 4.0–10.5)

## 2018-06-18 LAB — I-STAT TROPONIN, ED: Troponin i, poc: 0.01 ng/mL (ref 0.00–0.08)

## 2018-06-18 LAB — PROTIME-INR
INR: 0.98
Prothrombin Time: 12.9 seconds (ref 11.4–15.2)

## 2018-06-18 LAB — APTT: aPTT: 28 seconds (ref 24–36)

## 2018-06-18 LAB — CBG MONITORING, ED: Glucose-Capillary: 132 mg/dL — ABNORMAL HIGH (ref 70–99)

## 2018-06-18 MED ORDER — ATORVASTATIN CALCIUM 80 MG PO TABS
80.0000 mg | ORAL_TABLET | Freq: Every day | ORAL | Status: DC
  Administered 2018-06-18: 80 mg via ORAL
  Filled 2018-06-18: qty 1

## 2018-06-18 MED ORDER — CLOPIDOGREL BISULFATE 75 MG PO TABS
300.0000 mg | ORAL_TABLET | Freq: Once | ORAL | Status: AC
  Administered 2018-06-19: 300 mg via ORAL
  Filled 2018-06-18: qty 4

## 2018-06-18 MED ORDER — ASPIRIN EC 81 MG PO TBEC
81.0000 mg | DELAYED_RELEASE_TABLET | Freq: Every day | ORAL | Status: DC
  Administered 2018-06-19: 81 mg via ORAL
  Filled 2018-06-18: qty 1

## 2018-06-18 MED ORDER — ACETAMINOPHEN 160 MG/5ML PO SOLN: 650.0000 mg | ORAL | Status: DC | PRN

## 2018-06-18 MED ORDER — CLOPIDOGREL BISULFATE 75 MG PO TABS
75.0000 mg | ORAL_TABLET | Freq: Every day | ORAL | Status: DC
  Administered 2018-06-19: 75 mg via ORAL
  Filled 2018-06-18: qty 1

## 2018-06-18 MED ORDER — PERFLUTREN LIPID MICROSPHERE
1.0000 mL | INTRAVENOUS | Status: AC | PRN
  Administered 2018-06-18: 2 mL via INTRAVENOUS
  Filled 2018-06-18: qty 10

## 2018-06-18 MED ORDER — SENNOSIDES-DOCUSATE SODIUM 8.6-50 MG PO TABS: 1.0000 | ORAL_TABLET | Freq: Every evening | ORAL | Status: DC | PRN

## 2018-06-18 MED ORDER — OMEGA-3-ACID ETHYL ESTERS 1 G PO CAPS
1.0000 g | ORAL_CAPSULE | Freq: Every day | ORAL | Status: DC
  Administered 2018-06-19: 1 g via ORAL
  Filled 2018-06-18: qty 1

## 2018-06-18 MED ORDER — IOPAMIDOL (ISOVUE-370) INJECTION 76%
INTRAVENOUS | Status: AC
  Administered 2018-06-18: 50 mL
  Filled 2018-06-18: qty 50

## 2018-06-18 MED ORDER — ENOXAPARIN SODIUM 40 MG/0.4ML ~~LOC~~ SOLN
40.0000 mg | SUBCUTANEOUS | Status: DC
  Administered 2018-06-18: 40 mg via SUBCUTANEOUS
  Filled 2018-06-18: qty 0.4

## 2018-06-18 MED ORDER — ACETAMINOPHEN 325 MG PO TABS: 650.0000 mg | ORAL_TABLET | ORAL | Status: DC | PRN

## 2018-06-18 MED ORDER — ACETAMINOPHEN 650 MG RE SUPP: 650.0000 mg | RECTAL | Status: DC | PRN

## 2018-06-18 MED ORDER — METOPROLOL SUCCINATE ER 25 MG PO TB24
12.5000 mg | ORAL_TABLET | Freq: Every day | ORAL | Status: DC
  Administered 2018-06-19: 12.5 mg via ORAL
  Filled 2018-06-18: qty 1

## 2018-06-18 MED ORDER — STROKE: EARLY STAGES OF RECOVERY BOOK
Freq: Once | Status: DC
  Filled 2018-06-18: qty 1

## 2018-06-18 NOTE — ED Notes (Signed)
To Echocardiogram from hallway-- wife remains at bedside in hallway.

## 2018-06-18 NOTE — ED Notes (Signed)
Report given to Exie Parody, RN- pt is in 2-d echo at present

## 2018-06-18 NOTE — ED Triage Notes (Signed)
To ED via [facility] from home with c/o weakness that started last night at 1830- right sided weakness-  Pt called EMS this am due to inability to lift left leg and go golfing.  Code stroke called on arrival - dr Leonel Ramsay met at bedside- to ct scan at 1002.

## 2018-06-18 NOTE — ED Provider Notes (Signed)
Patrick Giles [facility] EMERGENCY DEPARTMENT Provider Note   CSN: [id number] Arrival date & time: 06/18/18  4098     History   Chief Complaint Chief Complaint  Patient presents with  . Code Stroke    HPI Patrick Giles is a 84 y.o. male.  HPI   Patrick Giles is a 84 y.o. male, with a history of CAD, dyslipidemia, HTN, CABG, and thoracic aortic aneurysm, presenting to the ED with right sided extremity weakness beginning around 6:30 PM last night.  Patient states he was watching TV when his right leg "felt like it was stuck to the floor."  He decided he would get it checked out in the morning.  Woke up with weakness in the right arm as well. EMS had concern for right-sided neglect, therefore code stroke was initially activated. Denies headache, syncope, chest pain, shortness of breath, abdominal pain, numbness, vision loss, facial droop, speech abnormalities, or any other complaints.    Past Medical History:  Diagnosis Date  . CAD (coronary artery disease)   . Dyslipidemia   . Hypertension   . S/P CABG (coronary artery bypass graft) 1998   LIMA to LAD, VG to PDA, VG to first diagonal    Patient Active Problem List   Diagnosis Date Noted  . CVA (cerebral vascular accident) (HCC) 06/18/2018  . Thoracic aortic aneurysm without rupture (HCC) 01/18/2018  . Ascending aorta dilatation (HCC) 01/18/2017  . Essential hypertension 07/24/2014  . History of DVT (deep vein thrombosis) 07/24/2014  . DVT, lower extremity (HCC) 07/23/2014  . CAD (coronary artery disease) 11/18/2013  . S/P CABG x 3 11/18/2013  . Cardiomyopathy, ischemic 11/18/2013  . Dyslipidemia 11/18/2013  . CKD (chronic kidney disease) stage 3, GFR 30-59 ml/min (HCC) 11/18/2013  . Impaired fasting glucose 11/18/2013    Past Surgical History:  Procedure Laterality Date  . CHOLECYSTECTOMY  1998  . CORONARY ARTERY BYPASS GRAFT  07/08/1997   LIMA to LAD, reverse SVG to PDA, reverse SVG to first diagonal (Dr.  Wynetta Fines)  . TRANSTHORACIC ECHOCARDIOGRAM  11/29/2012   EF 45-50%, grade 1 diastolic dysfunction        Home Medications    Prior to Admission medications   Medication Sig Start Date End Date Taking? Authorizing Provider  amLODipine-benazepril (LOTREL) 5-20 MG capsule TAKE ONE CAPSULE BY MOUTH EVERY DAY Patient taking differently: Take 1 capsule by mouth.  05/04/18  Yes HiltyLisette Abu, MD  aspirin 81 MG tablet Take 81 mg by mouth daily.   Yes [provider]  atorvastatin (LIPITOR) 40 MG tablet TAKE 1 TABLET BY MOUTH EVERY DAY AT 6PM Patient taking differently: Take 40 mg by mouth daily at 6 PM.  02/19/18  Yes Hilty, Lisette Abu, MD  GARLIC PO Take 1,191 mg by mouth daily.    Yes [provider]  metoprolol succinate (TOPROL-XL) 25 MG 24 hr tablet TAKE 1/2 A TABLET BY MOUTH EVERY DAY Patient taking differently: Take 12.5 mg by mouth daily.  04/02/18  Yes Hilty, Lisette Abu, MD  Multiple Vitamins-Minerals (ALIVE MENS ENERGY PO) Take 1 tablet by mouth daily.    Yes [provider]  Omega-3 Fatty Acids (FISH OIL) 1200 MG CAPS Take 2 capsules by mouth daily.   Yes [provider]    Family History Family History  Problem Relation Age of Onset  . Emphysema Mother   . Colon cancer Father   . Colon polyps Father   . Lung cancer Brother   .  Diabetes Neg Hx   . Kidney disease Neg Hx   . Esophageal cancer Neg Hx     Social History Social History   Tobacco Use  . Smoking status: Former Smoker    Types: Cigars    Last attempt to quit: 09/19/1988    Years since quitting: 29.7  . Smokeless tobacco: Never Used  Substance Use Topics  . Alcohol use: No    Alcohol/week: 0.0 standard drinks  . Drug use: No     Allergies   Patient has no known allergies.   Review of Systems Review of Systems  Constitutional: Negative for chills and fever.  Respiratory: Negative for shortness of breath.   Cardiovascular: Negative for chest pain.  Gastrointestinal:  Negative for abdominal pain, diarrhea, nausea and vomiting.  Musculoskeletal: Negative for neck pain.  Neurological: Positive for weakness. Negative for dizziness, syncope, numbness and headaches.  All other systems reviewed and are negative.    Physical Exam Updated Vital Signs BP (!) 171/102 (BP Location: Right Arm)   Pulse 74   Temp 98.3 F (36.8 C) (Oral)   Resp (!) 22   Ht 5\' 11"  (1.803 m)   Wt 94.3 kg   SpO2 96%   BMI 29.00 kg/m   Physical Exam  Constitutional: He is oriented to person, place, and time. He appears well-developed and well-nourished. No distress.  HENT:  Head: Normocephalic and atraumatic.  Eyes: Pupils are equal, round, and reactive to light. Conjunctivae and EOM are normal.  Neck: Neck supple.  Cardiovascular: Normal rate, regular rhythm, normal heart sounds and intact distal pulses.  Pulmonary/Chest: Effort normal and breath sounds normal. No respiratory distress.  Abdominal: Soft. There is no tenderness. There is no guarding.  Musculoskeletal: He exhibits no edema.  Lymphadenopathy:    He has no cervical adenopathy.  Neurological: He is alert and oriented to person, place, and time. He exhibits abnormal muscle tone.  Sensation to light touch appears to be grossly intact in the extremities. Right grip strength weaker than the left.  Right sided arm drift. Strength in the right upper and lower extremities 2/5, 5/5 on the left. No noted neglect, aphasia, or facial droop. Cranial nerves III through XII grossly intact.  Skin: Skin is warm and dry. He is not diaphoretic.  Psychiatric: He has a normal mood and affect. His behavior is normal.  Nursing note and vitals reviewed.    ED Treatments / Results  Labs (all labs ordered are listed, but only abnormal results are displayed) Labs Reviewed  COMPREHENSIVE METABOLIC PANEL - Abnormal; Notable for the following components:      Result Value   Glucose, Bld 129 (*)    Creatinine, Ser 1.65 (*)    GFR  calc non Af Amer 37 (*)    GFR calc Af Amer 43 (*)    All other components within normal limits  CBG MONITORING, ED - Abnormal; Notable for the following components:   Glucose-Capillary 132 (*)    All other components within normal limits  I-STAT CHEM 8, ED - Abnormal; Notable for the following components:   BUN 32 (*)    Creatinine, Ser 1.70 (*)    Glucose, Bld 127 (*)    All other components within normal limits  PROTIME-INR  APTT  CBC  DIFFERENTIAL  CBC  CREATININE, SERUM  I-STAT TROPONIN, ED    EKG EKG Interpretation  Date/Time:  Monday June 18 2018 09:55:23 EDT Ventricular Rate:  72 PR Interval:  154 QRS Duration: 98  QT Interval:  390 QTC Calculation: 427 R Axis:   46 Text Interpretation:  Sinus rhythm with marked sinus arrhythmia with occasional Premature ventricular complexes Nonspecific T wave abnormality Confirmed by Cathren Laine ([id number]) on 06/18/2018 12:38:40 PM   Radiology Ct Angio Head W Or Wo Contrast  Result Date: 06/18/2018 CLINICAL DATA:  Right-sided weakness. Last seen normal 1830 hours yesterday EXAM: CT ANGIOGRAPHY HEAD AND NECK TECHNIQUE: Multidetector CT imaging of the head and neck was performed using the standard protocol during bolus administration of intravenous contrast. Multiplanar CT image reconstructions and MIPs were obtained to evaluate the vascular anatomy. Carotid stenosis measurements (when applicable) are obtained utilizing NASCET criteria, using the distal internal carotid diameter as the denominator. CONTRAST:  50mL ISOVUE-370 IOPAMIDOL (ISOVUE-370) INJECTION 76% COMPARISON:  CT earlier same day FINDINGS: CTA NECK FINDINGS Aortic arch: Aortic atherosclerosis. Right carotid system: Common carotid artery is tortuous but widely patent to the bifurcation. There is calcified plaque at the distal common carotid artery and carotid bifurcation. Minimal diameter of the proximal ICA is 5 mm, therefore there is no stenosis. Left carotid system:  Common carotid artery is widely patent to the bifurcation. There is calcified plaque at the distal common carotid artery and carotid bifurcation region. Minimal diameter of the proximal ICA is 6 mm, no stenosis. Cervical ICA is tortuous but widely patent beyond that. Vertebral arteries: Left vertebral artery is dominant. There is calcified plaque at each vertebral artery origin. 30-50% stenosis at each vertebral artery origin. Beyond that, the vessels are widely patent through the cervical region to the foramen magnum. Skeleton: Mid cervical spondylosis. Other neck: No soft tissue mass. Upper chest: Elevated left hemidiaphragm with left base volume loss. Otherwise negative. Review of the MIP images confirms the above findings CTA HEAD FINDINGS Anterior circulation: Both internal carotid arteries are widely patent through the skull base and to the siphon regions. There is siphon atherosclerotic calcification. There is atherosclerotic narrowing of the supraclinoid internal carotid arteries on each side, estimated at 50-70%. Beyond that, the anterior and middle cerebral vessels are patent without evidence of stenosis or missing branch vessel. Posterior circulation: There is atherosclerotic calcification at the foramen magnum level but no stenosis. The small right vertebral artery terminates in PICA. The dominant left vertebral artery is widely patent to the basilar. No basilar stenosis. Posterior circulation branch vessels are patent. Venous sinuses: Patent and normal Anatomic variants: None significant Delayed phase: No abnormal enhancement Review of the MIP images confirms the above findings IMPRESSION: Calcified plaque at both carotid bifurcation regions but without stenosis. Calcified plaque at both vertebral artery origins with narrowing estimated at 30-50% on each side. Atherosclerotic disease in both carotid siphon regions with supraclinoid ICA stenoses estimated at 50-70% on each side. No more distal vessel  stenosis or occlusion identified. These results were communicated to Dr. Amada Jupiter At 10:39 amon 9/30/2019by text page via the [facility] messaging system. Electronically Signed   By: Paulina Fusi M.D.   On: 06/18/2018 10:40   Ct Angio Neck W Or Wo Contrast  Result Date: 06/18/2018 CLINICAL DATA:  Right-sided weakness. Last seen normal 1830 hours yesterday EXAM: CT ANGIOGRAPHY HEAD AND NECK TECHNIQUE: Multidetector CT imaging of the head and neck was performed using the standard protocol during bolus administration of intravenous contrast. Multiplanar CT image reconstructions and MIPs were obtained to evaluate the vascular anatomy. Carotid stenosis measurements (when applicable) are obtained utilizing NASCET criteria, using the distal internal carotid diameter as the denominator. CONTRAST:  50mL ISOVUE-370 IOPAMIDOL (ISOVUE-370)  INJECTION 76% COMPARISON:  CT earlier same day FINDINGS: CTA NECK FINDINGS Aortic arch: Aortic atherosclerosis. Right carotid system: Common carotid artery is tortuous but widely patent to the bifurcation. There is calcified plaque at the distal common carotid artery and carotid bifurcation. Minimal diameter of the proximal ICA is 5 mm, therefore there is no stenosis. Left carotid system: Common carotid artery is widely patent to the bifurcation. There is calcified plaque at the distal common carotid artery and carotid bifurcation region. Minimal diameter of the proximal ICA is 6 mm, no stenosis. Cervical ICA is tortuous but widely patent beyond that. Vertebral arteries: Left vertebral artery is dominant. There is calcified plaque at each vertebral artery origin. 30-50% stenosis at each vertebral artery origin. Beyond that, the vessels are widely patent through the cervical region to the foramen magnum. Skeleton: Mid cervical spondylosis. Other neck: No soft tissue mass. Upper chest: Elevated left hemidiaphragm with left base volume loss. Otherwise negative. Review of the MIP images  confirms the above findings CTA HEAD FINDINGS Anterior circulation: Both internal carotid arteries are widely patent through the skull base and to the siphon regions. There is siphon atherosclerotic calcification. There is atherosclerotic narrowing of the supraclinoid internal carotid arteries on each side, estimated at 50-70%. Beyond that, the anterior and middle cerebral vessels are patent without evidence of stenosis or missing branch vessel. Posterior circulation: There is atherosclerotic calcification at the foramen magnum level but no stenosis. The small right vertebral artery terminates in PICA. The dominant left vertebral artery is widely patent to the basilar. No basilar stenosis. Posterior circulation branch vessels are patent. Venous sinuses: Patent and normal Anatomic variants: None significant Delayed phase: No abnormal enhancement Review of the MIP images confirms the above findings IMPRESSION: Calcified plaque at both carotid bifurcation regions but without stenosis. Calcified plaque at both vertebral artery origins with narrowing estimated at 30-50% on each side. Atherosclerotic disease in both carotid siphon regions with supraclinoid ICA stenoses estimated at 50-70% on each side. No more distal vessel stenosis or occlusion identified. These results were communicated to Dr. Amada Jupiter At 10:39 amon 9/30/2019by text page via the [facility] messaging system. Electronically Signed   By: Paulina Fusi M.D.   On: 06/18/2018 10:40   Ct Head Code Stroke Wo Contrast  Result Date: 06/18/2018 CLINICAL DATA:  Code stroke. 84 year old male with right side weakness. Last seen well 1830 hours yesterday. EXAM: CT HEAD WITHOUT CONTRAST TECHNIQUE: Contiguous axial images were obtained from the base of the skull through the vertex without intravenous contrast. COMPARISON:  None. FINDINGS: Brain: Well-developed hypodensity in the left anterior limb external capsule most resembling chronic small-vessel lacune (coronal  image 30). Elsewhere gray-white matter differentiation appears normal for age. No midline shift, ventriculomegaly, mass effect, evidence of mass lesion, intracranial hemorrhage or evidence of cortically based acute infarction. Vascular: Dominant left vertebral artery. Calcified atherosclerosis at the skull base. No suspicious intracranial vascular hyperdensity. Skull: Negative. Sinuses/Orbits: Right middle ear, mastoid and external auditory canal opacification. This appears inflammatory. There is asymmetric right mastoid sclerosis. Trace contralateral left mastoid effusion. Negative visible nasopharynx. The visible paranasal sinus and left tympanic cavity are well pneumatized. Other: Negative orbit and scalp soft tissues. ASPECTS [facility] Stroke Program Early CT Score) - Ganglionic level infarction (caudate, lentiform nuclei, internal capsule, insula, M1-M3 cortex): 7 - Supraganglionic infarction (M4-M6 cortex): 3 Total score (0-10 with 10 being normal): 10 IMPRESSION: 1. Chronic appearing left external capsule lacunar infarct. Otherwise normal for age noncontrast CT appearance of the brain. 2.  ASPECTS is 10 (chronic left deep white matter lacunar infarct suspected). 3. Right EAC, middle ear, and mastoid inflammatory process - probably acute on chronic. 4. These results were communicated to Dr. Amada Jupiter at 10:20 amon 9/30/2019by text page via the [facility] messaging system. Electronically Signed   By: Odessa Fleming M.D.   On: 06/18/2018 10:20    Procedures .Critical Care Performed by: Anselm Pancoast, PA-C Authorized by: Anselm Pancoast, PA-C   Critical care provider statement:    Critical care time (minutes):  35   Critical care time was exclusive of:  Separately billable procedures and treating other patients   Critical care was necessary to treat or prevent imminent or life-threatening deterioration of the following conditions:  CNS failure or compromise   Critical care was time spent personally by me on the  following activities:  Development of treatment plan with patient or surrogate, discussions with consultants, examination of patient, obtaining history from patient or surrogate, review of old charts, re-evaluation of patient's condition, pulse oximetry, ordering and review of radiographic studies, ordering and review of laboratory studies and ordering and performing treatments and interventions   I assumed direction of critical care for this patient from another provider in my specialty: no     (including critical care time)  Medications Ordered in ED Medications  clopidogrel (PLAVIX) tablet 300 mg (has no administration in time range)  clopidogrel (PLAVIX) tablet 75 mg (has no administration in time range)  aspirin tablet 81 mg (has no administration in time range)  atorvastatin (LIPITOR) tablet 80 mg (has no administration in time range)  metoprolol succinate (TOPROL-XL) 24 hr tablet 12.5 mg (has no administration in time range)  Fish Oil CAPS 2,400 mg (has no administration in time range)   stroke: mapping our early stages of recovery book (has no administration in time range)  acetaminophen (TYLENOL) tablet 650 mg (has no administration in time range)    Or  acetaminophen (TYLENOL) solution 650 mg (has no administration in time range)    Or  acetaminophen (TYLENOL) suppository 650 mg (has no administration in time range)  senna-docusate (Senokot-S) tablet 1 tablet (has no administration in time range)  enoxaparin (LOVENOX) injection 40 mg (has no administration in time range)  iopamidol (ISOVUE-370) 76 % injection (50 mLs  Contrast Given 06/18/18 1015)     Initial Impression / Assessment and Plan / ED Course  I have reviewed the triage vital signs and the nursing notes.  Pertinent labs & imaging results that were available during my care of the patient were reviewed by me and considered in my medical decision making (see chart for details).  Clinical Course as of Jun 18 1346  Mon  Jun 18, 2018  1026 Dr. Amada Jupiter advises medicine admission.   [SJ]  1318 Spoke with Dr. Mahala Menghini, hospitalist. Agrees to admit the patient.    [SJ]    Clinical Course User Index [SJ] Joanthony Hamza C, PA-C    Patient presents with right upper and lower extremity weakness beginning last night.  He does not appear to have any other complaints or deficits.  VAN negative.  Patient admitted for further evaluation and management.   Findings and plan of care discussed with Cathren Laine, MD. Dr. Denton Lank personally evaluated and examined this patient.  Final Clinical Impressions(s) / ED Diagnoses   Final diagnoses:  Right-sided muscle weakness    ED Discharge Orders    None       Anselm Pancoast, PA-C 06/18/18 1347  Cathren Laine, MD 06/19/18 [phone]

## 2018-06-18 NOTE — Consult Note (Addendum)
Neurology Consultation Reason for Consult: Right-sided weakness Referring Physician: Leeann Must  CC: Right-sided weakness  History is obtained from: Patient  HPI: Patrick Giles is a 84 y.o. male with a history of CAD, hypertension, hyperlipidemia who presents with right-sided weakness that was present on awakening.  He states that he went to bed feeling normal, when he woke up he noticed that he was having problems with his right side.  Due to this he activated 911 and EMS was concerned that he might have some neglect and therefore activated a code stroke for the 24-hour window.  He does take BP meds, but does nto currently take any blood thinners. He was on elliquis previously due to DVT, but is not currently.   LKW: 10 PM tpa given?: no, out of window    ROS: A 14 point ROS was performed and is negative except as noted in the HPI.   Past Medical History:  Diagnosis Date  . CAD (coronary artery disease)   . Dyslipidemia   . Hypertension   . S/P CABG (coronary artery bypass graft) 1998   LIMA to LAD, VG to PDA, VG to first diagonal     Family History  Problem Relation Age of Onset  . Emphysema Mother   . Colon cancer Father   . Colon polyps Father   . Lung cancer Brother   . Diabetes Neg Hx   . Kidney disease Neg Hx   . Esophageal cancer Neg Hx      Social History:  reports that he quit smoking about 29 years ago. His smoking use included cigars. He has never used smokeless tobacco. He reports that he does not drink alcohol or use drugs.   Exam: Current vital signs: There were no vitals filed for this visit. Vital signs in last 24 hours:     Physical Exam  Constitutional: Appears well-developed and well-nourished.  Psych: Affect appropriate to situation Eyes: No scleral injection HENT: No OP obstrucion Head: Normocephalic.  Cardiovascular: Normal rate and regular rhythm.  Respiratory: Effort normal, non-labored breathing GI: Soft.  No distension. There is no  tenderness.  Skin: WDI  Neuro: Mental Status: Patient is awake, alert, oriented to person, place, month, year, and situation. Patient is able to give a clear and coherent history. No signs of aphasia or neglect Cranial Nerves: II: Visual Fields are full. Pupils are equal, round, and reactive to light.   III,IV, VI: EOMI without ptosis or diploplia.  V: Facial sensation is diminished on the right VII: Facial movement is symmetric.  VIII: hearing is intact to voice X: Uvula elevates symmetrically XI: Shoulder shrug is symmetric. XII: tongue is midline without atrophy or fasciculations.  Motor: Tone is normal. Bulk is normal. 5/5 strength was present on the left, he has 4/5 strength of the right arm and 3/5 right leg. Sensory: Sensation is diminished to light touch and temperature in the right arm leg and face Cerebellar: FNF intact on the left, consistent with weakness on the right   I have reviewed labs in epic and the results pertinent to this consultation are: Creatinine 1.7 Sodium 140 Glucose 127  I have reviewed the images obtained: CT head-unremarkable  Impression: 84 year old male with new, likely small subcortical infarct.  He will need to be admitted for secondary risk factor modification and therapy.  Recommendations: - HgbA1c, fasting lipid panel - MRI of the brain without contrast - Frequent neuro checks - Echocardiogram - CTA head neck - Prophylactic therapy-Antiplatelet med: Aspirin -  dose 325mg  PO or 300mg  PR, Plavix 75 mg daily following 300 mg - Risk factor modification - Telemetry monitoring - PT consult, OT consult, Speech consult - Stroke team to follow   Ritta Slot, MD [facility] [phone]  If 7pm- 7am, please page neurology on call as listed in AMION.

## 2018-06-18 NOTE — H&P (Signed)
History and Physical    DOA: 06/18/2018  PCP: Eartha Inch, MD  Patient coming from: home  Chief Complaint: Right-sided weakness and numbness  HPI: Patrick Giles is a 84 y.o. male with history h/o CAD status post CABG, hypertension, hyperlipidemia, 4.7 cm aortic aneurysm (which has been stable over the last 3 years per patient report), presents today with complaints of right-sided weakness and numbness that he noticed last night at 6:30 PM.  Patient apparently was sitting and watching a football game when he felt that his right foot was numb when stuck to the floor.  He could not move initially but later was able to move his leg.  Around 10 PM, he decided to go to bed and used his wife's cane with his left hand to ambulate.  Wife reports that patient had poor grip in his right upper extremity and dropped a glass of water.  No reports of dysarthria, dysphagia or vision changes.  This morning he woke up with persistent symptoms if not worse.  He called his friend and let him know that he could not play golf as planned.  He then called his PCP who advised him to present to ED.  Patient reports compliance with medications including aspirin and blood pressure medications.  He reports good control of blood pressure and cholesterol levels in general.  He reports regular surveillance for aortic aneurysm and was advised of stability after the last scan in March 2019.  He denies any chest pain, shortness of breath, palpitations, nausea vomiting or incontinence.  He has been able to use the urinal in the ED.  Patient was evaluated by neurology and advised admission for completion of stroke work-up.  Patient states his symptoms are much improved now compared to presentation although he still has some deficits on the right side.   Review of Systems: As per HPI otherwise 10 point review of systems negative.    Past Medical History:  Diagnosis Date  . CAD (coronary artery disease)   . Dyslipidemia   .  Hypertension   . S/P CABG (coronary artery bypass graft) 1998   LIMA to LAD, VG to PDA, VG to first diagonal    Past Surgical History:  Procedure Laterality Date  . CHOLECYSTECTOMY  1998  . CORONARY ARTERY BYPASS GRAFT  07/08/1997   LIMA to LAD, reverse SVG to PDA, reverse SVG to first diagonal (Dr. Wynetta Fines)  . TRANSTHORACIC ECHOCARDIOGRAM  11/29/2012   EF 45-50%, grade 1 diastolic dysfunction    Social history:  reports that he quit smoking about 29 years ago. His smoking use included cigars. He has never used smokeless tobacco. He reports that he does not drink alcohol or use drugs.   No Known Allergies  Family History  Problem Relation Age of Onset  . Emphysema Mother   . Colon cancer Father   . Colon polyps Father   . Lung cancer Brother   . Diabetes Neg Hx   . Kidney disease Neg Hx   . Esophageal cancer Neg Hx       Prior to Admission medications   Medication Sig Start Date End Date Taking? Authorizing Provider  amLODipine-benazepril (LOTREL) 5-20 MG capsule TAKE ONE CAPSULE BY MOUTH EVERY DAY Patient taking differently: Take 1 capsule by mouth.  05/04/18  Yes HiltyLisette Abu, MD  aspirin 81 MG tablet Take 81 mg by mouth daily.   Yes [provider]  atorvastatin (LIPITOR) 40 MG tablet TAKE 1 TABLET BY MOUTH  EVERY DAY AT 6PM Patient taking differently: Take 40 mg by mouth daily at 6 PM.  02/19/18  Yes Hilty, Lisette Abu, MD  GARLIC PO Take 1,324 mg by mouth daily.    Yes [provider]  metoprolol succinate (TOPROL-XL) 25 MG 24 hr tablet TAKE 1/2 A TABLET BY MOUTH EVERY DAY Patient taking differently: Take 12.5 mg by mouth daily.  04/02/18  Yes Hilty, Lisette Abu, MD  Multiple Vitamins-Minerals (ALIVE MENS ENERGY PO) Take 1 tablet by mouth daily.    Yes [provider]  Omega-3 Fatty Acids (FISH OIL) 1200 MG CAPS Take 2 capsules by mouth daily.   Yes [provider]    Physical Exam: Vitals:   06/18/18 1000 06/18/18 1059  BP: (!)  171/102   Pulse: 74   Resp: (!) 22   Temp: 98.3 F (36.8 C)   TempSrc: Oral   SpO2: 96%   Weight:  94.3 kg  Height:  5\' 11"  (1.803 m)    Constitutional: NAD, calm, comfortable Vitals:   06/18/18 1000 06/18/18 1059  BP: (!) 171/102   Pulse: 74   Resp: (!) 22   Temp: 98.3 F (36.8 C)   TempSrc: Oral   SpO2: 96%   Weight:  94.3 kg  Height:  5\' 11"  (1.803 m)   Eyes: PERRL, lids and conjunctivae normal ENMT: Mucous membranes are moist. Posterior pharynx clear of any exudate or lesions.Normal dentition.  Neck: normal, supple, no masses, no thyromegaly Respiratory: clear to auscultation bilaterally, no wheezing, no crackles. Normal respiratory effort. No accessory muscle use.  Cardiovascular: Regular rate and rhythm, no murmurs / rubs / gallops. No extremity edema. 2+ pedal pulses. No carotid bruits.  Abdomen: no tenderness, no masses palpated. No hepatosplenomegaly. Bowel sounds positive.  Musculoskeletal: no clubbing / cyanosis. No joint deformity upper and lower extremities. Good ROM, no contractures. Normal muscle tone.  Neurologic: CN 2-12 grossly intact. Sensation to crude touch reduced on the right lower extremity, DTR normal. Strength 4/5 in right upper and right lower extremity compared to 5/5 on the left side.  He has mild pronator drip on the right side.  Speech appears normal.  No facial droop. Psychiatric: Normal judgment and insight. Alert and oriented x 3. Normal mood.  SKIN/catheters: no rashes, lesions, ulcers. No induration  Labs on Admission: I have personally reviewed following labs and imaging studies  CBC: Recent Labs  Lab 06/18/18 1001 06/18/18 1010  WBC 7.0  --   NEUTROABS 4.5  --   HGB 13.6 13.9  HCT 42.0 41.0  MCV 93.8  --   PLT 169  --    Basic Metabolic Panel: Recent Labs  Lab 06/18/18 1001 06/18/18 1010  NA 139 140  K 4.7 4.7  CL 105 105  CO2 26  --   GLUCOSE 129* 127*  BUN 23 32*  CREATININE 1.65* 1.70*  CALCIUM 9.8  --     GFR: Estimated Creatinine Clearance: 38.6 mL/min (A) (by C-G formula based on SCr of 1.7 mg/dL (H)). Liver Function Tests: Recent Labs  Lab 06/18/18 1001  AST 26  ALT 20  ALKPHOS 50  BILITOT 0.8  PROT 7.1  ALBUMIN 3.8   No results for input(s): LIPASE, AMYLASE in the last 168 hours. No results for input(s): AMMONIA in the last 168 hours. Coagulation Profile: Recent Labs  Lab 06/18/18 1001  INR 0.98   Cardiac Enzymes: No results for input(s): CKTOTAL, CKMB, CKMBINDEX, TROPONINI in the last 168 hours. BNP (last 3  results) No results for input(s): PROBNP in the last 8760 hours. HbA1C: No results for input(s): HGBA1C in the last 72 hours. CBG: Recent Labs  Lab 06/18/18 1009  GLUCAP 132*   Lipid Profile: No results for input(s): CHOL, HDL, LDLCALC, TRIG, CHOLHDL, LDLDIRECT in the last 72 hours. Thyroid Function Tests: No results for input(s): TSH, T4TOTAL, FREET4, T3FREE, THYROIDAB in the last 72 hours. Anemia Panel: No results for input(s): VITAMINB12, FOLATE, FERRITIN, TIBC, IRON, RETICCTPCT in the last 72 hours. Urine analysis:    Component Value Date/Time   COLORURINE YELLOW 07/24/2014 0640   APPEARANCEUR CLOUDY (A) 07/24/2014 0640   LABSPEC 1.019 07/24/2014 0640   PHURINE 5.5 07/24/2014 0640   GLUCOSEU NEGATIVE 07/24/2014 0640   HGBUR LARGE (A) 07/24/2014 0640   BILIRUBINUR neg 08/01/2014 0905   KETONESUR 15 (A) 07/24/2014 0640   PROTEINUR 30 08/01/2014 0905   PROTEINUR 30 (A) 07/24/2014 0640   UROBILINOGEN 0.2 08/01/2014 0905   UROBILINOGEN 0.2 07/24/2014 0640   NITRITE neg 08/01/2014 0905   NITRITE NEGATIVE 07/24/2014 0640   LEUKOCYTESUR small (1+) 08/01/2014 0905    Radiological Exams on Admission: Ct Angio Head W Or Wo Contrast  Result Date: 06/18/2018 CLINICAL DATA:  Right-sided weakness. Last seen normal 1830 hours yesterday EXAM: CT ANGIOGRAPHY HEAD AND NECK TECHNIQUE: Multidetector CT imaging of the head and neck was performed using the  standard protocol during bolus administration of intravenous contrast. Multiplanar CT image reconstructions and MIPs were obtained to evaluate the vascular anatomy. Carotid stenosis measurements (when applicable) are obtained utilizing NASCET criteria, using the distal internal carotid diameter as the denominator. CONTRAST:  50mL ISOVUE-370 IOPAMIDOL (ISOVUE-370) INJECTION 76% COMPARISON:  CT earlier same day FINDINGS: CTA NECK FINDINGS Aortic arch: Aortic atherosclerosis. Right carotid system: Common carotid artery is tortuous but widely patent to the bifurcation. There is calcified plaque at the distal common carotid artery and carotid bifurcation. Minimal diameter of the proximal ICA is 5 mm, therefore there is no stenosis. Left carotid system: Common carotid artery is widely patent to the bifurcation. There is calcified plaque at the distal common carotid artery and carotid bifurcation region. Minimal diameter of the proximal ICA is 6 mm, no stenosis. Cervical ICA is tortuous but widely patent beyond that. Vertebral arteries: Left vertebral artery is dominant. There is calcified plaque at each vertebral artery origin. 30-50% stenosis at each vertebral artery origin. Beyond that, the vessels are widely patent through the cervical region to the foramen magnum. Skeleton: Mid cervical spondylosis. Other neck: No soft tissue mass. Upper chest: Elevated left hemidiaphragm with left base volume loss. Otherwise negative. Review of the MIP images confirms the above findings CTA HEAD FINDINGS Anterior circulation: Both internal carotid arteries are widely patent through the skull base and to the siphon regions. There is siphon atherosclerotic calcification. There is atherosclerotic narrowing of the supraclinoid internal carotid arteries on each side, estimated at 50-70%. Beyond that, the anterior and middle cerebral vessels are patent without evidence of stenosis or missing branch vessel. Posterior circulation: There is  atherosclerotic calcification at the foramen magnum level but no stenosis. The small right vertebral artery terminates in PICA. The dominant left vertebral artery is widely patent to the basilar. No basilar stenosis. Posterior circulation branch vessels are patent. Venous sinuses: Patent and normal Anatomic variants: None significant Delayed phase: No abnormal enhancement Review of the MIP images confirms the above findings IMPRESSION: Calcified plaque at both carotid bifurcation regions but without stenosis. Calcified plaque at both vertebral artery origins with  narrowing estimated at 30-50% on each side. Atherosclerotic disease in both carotid siphon regions with supraclinoid ICA stenoses estimated at 50-70% on each side. No more distal vessel stenosis or occlusion identified. These results were communicated to Dr. Amada Jupiter At 10:39 amon 9/30/2019by text page via the [facility] messaging system. Electronically Signed   By: Paulina Fusi M.D.   On: 06/18/2018 10:40   Ct Angio Neck W Or Wo Contrast  Result Date: 06/18/2018 CLINICAL DATA:  Right-sided weakness. Last seen normal 1830 hours yesterday EXAM: CT ANGIOGRAPHY HEAD AND NECK TECHNIQUE: Multidetector CT imaging of the head and neck was performed using the standard protocol during bolus administration of intravenous contrast. Multiplanar CT image reconstructions and MIPs were obtained to evaluate the vascular anatomy. Carotid stenosis measurements (when applicable) are obtained utilizing NASCET criteria, using the distal internal carotid diameter as the denominator. CONTRAST:  50mL ISOVUE-370 IOPAMIDOL (ISOVUE-370) INJECTION 76% COMPARISON:  CT earlier same day FINDINGS: CTA NECK FINDINGS Aortic arch: Aortic atherosclerosis. Right carotid system: Common carotid artery is tortuous but widely patent to the bifurcation. There is calcified plaque at the distal common carotid artery and carotid bifurcation. Minimal diameter of the proximal ICA is 5 mm, therefore  there is no stenosis. Left carotid system: Common carotid artery is widely patent to the bifurcation. There is calcified plaque at the distal common carotid artery and carotid bifurcation region. Minimal diameter of the proximal ICA is 6 mm, no stenosis. Cervical ICA is tortuous but widely patent beyond that. Vertebral arteries: Left vertebral artery is dominant. There is calcified plaque at each vertebral artery origin. 30-50% stenosis at each vertebral artery origin. Beyond that, the vessels are widely patent through the cervical region to the foramen magnum. Skeleton: Mid cervical spondylosis. Other neck: No soft tissue mass. Upper chest: Elevated left hemidiaphragm with left base volume loss. Otherwise negative. Review of the MIP images confirms the above findings CTA HEAD FINDINGS Anterior circulation: Both internal carotid arteries are widely patent through the skull base and to the siphon regions. There is siphon atherosclerotic calcification. There is atherosclerotic narrowing of the supraclinoid internal carotid arteries on each side, estimated at 50-70%. Beyond that, the anterior and middle cerebral vessels are patent without evidence of stenosis or missing branch vessel. Posterior circulation: There is atherosclerotic calcification at the foramen magnum level but no stenosis. The small right vertebral artery terminates in PICA. The dominant left vertebral artery is widely patent to the basilar. No basilar stenosis. Posterior circulation branch vessels are patent. Venous sinuses: Patent and normal Anatomic variants: None significant Delayed phase: No abnormal enhancement Review of the MIP images confirms the above findings IMPRESSION: Calcified plaque at both carotid bifurcation regions but without stenosis. Calcified plaque at both vertebral artery origins with narrowing estimated at 30-50% on each side. Atherosclerotic disease in both carotid siphon regions with supraclinoid ICA stenoses estimated at  50-70% on each side. No more distal vessel stenosis or occlusion identified. These results were communicated to Dr. Amada Jupiter At 10:39 amon 9/30/2019by text page via the [facility] messaging system. Electronically Signed   By: Paulina Fusi M.D.   On: 06/18/2018 10:40   Ct Head Code Stroke Wo Contrast  Result Date: 06/18/2018 CLINICAL DATA:  Code stroke. 84 year old male with right side weakness. Last seen well 1830 hours yesterday. EXAM: CT HEAD WITHOUT CONTRAST TECHNIQUE: Contiguous axial images were obtained from the base of the skull through the vertex without intravenous contrast. COMPARISON:  None. FINDINGS: Brain: Well-developed hypodensity in the left anterior limb external  capsule most resembling chronic small-vessel lacune (coronal image 30). Elsewhere gray-white matter differentiation appears normal for age. No midline shift, ventriculomegaly, mass effect, evidence of mass lesion, intracranial hemorrhage or evidence of cortically based acute infarction. Vascular: Dominant left vertebral artery. Calcified atherosclerosis at the skull base. No suspicious intracranial vascular hyperdensity. Skull: Negative. Sinuses/Orbits: Right middle ear, mastoid and external auditory canal opacification. This appears inflammatory. There is asymmetric right mastoid sclerosis. Trace contralateral left mastoid effusion. Negative visible nasopharynx. The visible paranasal sinus and left tympanic cavity are well pneumatized. Other: Negative orbit and scalp soft tissues. ASPECTS [facility] Stroke Program Early CT Score) - Ganglionic level infarction (caudate, lentiform nuclei, internal capsule, insula, M1-M3 cortex): 7 - Supraganglionic infarction (M4-M6 cortex): 3 Total score (0-10 with 10 being normal): 10 IMPRESSION: 1. Chronic appearing left external capsule lacunar infarct. Otherwise normal for age noncontrast CT appearance of the brain. 2. ASPECTS is 10 (chronic left deep white matter lacunar infarct suspected). 3. Right  EAC, middle ear, and mastoid inflammatory process - probably acute on chronic. 4. These results were communicated to Dr. Amada Jupiter at 10:20 amon 9/30/2019by text page via the [facility] messaging system. Electronically Signed   By: Odessa Fleming M.D.   On: 06/18/2018 10:20    EKG: Independently reviewed.  Sinus rhythm with PVCs and nonspecific T wave changes in aVL and aVF     Assessment and Plan:   1.  Right-sided weakness: Secondary to TIA versus CVA.  CT head negative.  MRI head ordered.  CTA obtained per neurology recommendations shows 'atherosclerotic narrowing of the supraclinoid internal carotid arteries on each side, estimated at 50-70%. Beyond that, the anterior and middle cerebral vessels are patent without evidence of stenosis or missing branch vessel" on CT head and CTA neck negative for carotid stenosis.  Patient on aspirin at baseline.  Plavix added.  He received 300 mg Plavix in the ED.  Echo ordered and pending.  2.  Hypertension: Will resume low-dose beta-blockers to avoid reflex tachycardia.  Hold amlodipine/Benzapril for permissive hypertension.  3.  Hyperlipidemia: Resume fish oil and Lipitor at 80 mg, lipid profile in a.m.  4.  CAD status post CABG: Resume aspirin and beta-blockers.  DVT prophylaxis: Lovenox  Code Status: Full code.  Wife, Lowry Bowl would be the next of kin.  Consults called: Neurology Admission status:  Patient admitted as observation status as anticipated LOS less than 2 midnights for presumed TIA given rapid improvement of symptoms.  If patient has persistent symptoms and rules in for stroke, may need upgraded to inpatient status.   Alessandra Bevels MD [facility] Pager [phone]  If 7PM-7AM, please contact night-coverage [email or web address] Password [email or web address]  06/18/2018, 2:02 PM

## 2018-06-18 NOTE — ED Notes (Signed)
Admitting dr at bedside.  

## 2018-06-18 NOTE — Plan of Care (Signed)
  Problem: Education: Goal: Knowledge of General Education information will improve Description Including pain rating scale, medication(s)/side effects and non-pharmacologic comfort measures Outcome: Progressing   Problem: Coping: Goal: Level of anxiety will decrease Outcome: Progressing   Problem: Education: Goal: Knowledge of disease or condition will improve Outcome: Progressing   Problem: Self-Care: Goal: Ability to communicate needs accurately will improve Outcome: Progressing

## 2018-06-18 NOTE — Progress Notes (Signed)
  Echocardiogram 2D Echocardiogram has been performed.  Patrick Giles 06/18/2018, 3:58 PM

## 2018-06-18 NOTE — ED Notes (Signed)
Spoke with admitting MD for Diet orders due to pt passing swallow screen. Cardiac diet ordered at this time. Call placed to service response for dinner tray.

## 2018-06-19 DIAGNOSIS — Z951 Presence of aortocoronary bypass graft: Secondary | ICD-10-CM | POA: Diagnosis not present

## 2018-06-19 DIAGNOSIS — I712 Thoracic aortic aneurysm, without rupture: Secondary | ICD-10-CM | POA: Diagnosis present

## 2018-06-19 DIAGNOSIS — Z86718 Personal history of other venous thrombosis and embolism: Secondary | ICD-10-CM | POA: Diagnosis not present

## 2018-06-19 DIAGNOSIS — Z6829 Body mass index (BMI) 29.0-29.9, adult: Secondary | ICD-10-CM | POA: Diagnosis not present

## 2018-06-19 DIAGNOSIS — Z79899 Other long term (current) drug therapy: Secondary | ICD-10-CM | POA: Diagnosis not present

## 2018-06-19 DIAGNOSIS — I2510 Atherosclerotic heart disease of native coronary artery without angina pectoris: Secondary | ICD-10-CM | POA: Diagnosis not present

## 2018-06-19 DIAGNOSIS — G8191 Hemiplegia, unspecified affecting right dominant side: Secondary | ICD-10-CM | POA: Diagnosis present

## 2018-06-19 DIAGNOSIS — R29701 NIHSS score 1: Secondary | ICD-10-CM | POA: Diagnosis present

## 2018-06-19 DIAGNOSIS — M6281 Muscle weakness (generalized): Secondary | ICD-10-CM | POA: Diagnosis present

## 2018-06-19 DIAGNOSIS — E669 Obesity, unspecified: Secondary | ICD-10-CM | POA: Diagnosis present

## 2018-06-19 DIAGNOSIS — I639 Cerebral infarction, unspecified: Secondary | ICD-10-CM | POA: Diagnosis present

## 2018-06-19 DIAGNOSIS — N183 Chronic kidney disease, stage 3 (moderate): Secondary | ICD-10-CM | POA: Diagnosis present

## 2018-06-19 DIAGNOSIS — R531 Weakness: Secondary | ICD-10-CM | POA: Diagnosis not present

## 2018-06-19 DIAGNOSIS — I129 Hypertensive chronic kidney disease with stage 1 through stage 4 chronic kidney disease, or unspecified chronic kidney disease: Secondary | ICD-10-CM | POA: Diagnosis present

## 2018-06-19 DIAGNOSIS — I10 Essential (primary) hypertension: Secondary | ICD-10-CM | POA: Diagnosis not present

## 2018-06-19 DIAGNOSIS — Z87891 Personal history of nicotine dependence: Secondary | ICD-10-CM | POA: Diagnosis not present

## 2018-06-19 DIAGNOSIS — Z7902 Long term (current) use of antithrombotics/antiplatelets: Secondary | ICD-10-CM | POA: Diagnosis not present

## 2018-06-19 DIAGNOSIS — I6529 Occlusion and stenosis of unspecified carotid artery: Secondary | ICD-10-CM | POA: Diagnosis not present

## 2018-06-19 DIAGNOSIS — G459 Transient cerebral ischemic attack, unspecified: Secondary | ICD-10-CM | POA: Diagnosis not present

## 2018-06-19 DIAGNOSIS — E785 Hyperlipidemia, unspecified: Secondary | ICD-10-CM | POA: Diagnosis present

## 2018-06-19 DIAGNOSIS — Z7982 Long term (current) use of aspirin: Secondary | ICD-10-CM | POA: Diagnosis not present

## 2018-06-19 LAB — LIPID PANEL
CHOL/HDL RATIO: 3.1 ratio
Cholesterol: 135 mg/dL (ref 0–200)
HDL: 44 mg/dL (ref >40)
LDL CALC: 68 mg/dL (ref 0–99)
Triglycerides: 115 mg/dL (ref <150)
VLDL: 23 mg/dL (ref 0–40)

## 2018-06-19 MED ORDER — AMLODIPINE BESY-BENAZEPRIL HCL 5-20 MG PO CAPS: 1.0000 | ORAL_CAPSULE | Freq: Every day | ORAL | Status: DC

## 2018-06-19 MED ORDER — PANTOPRAZOLE SODIUM 40 MG PO TBEC: 40.0000 mg | DELAYED_RELEASE_TABLET | Freq: Every day | ORAL | 2 refills | Status: DC

## 2018-06-19 MED ORDER — CLOPIDOGREL BISULFATE 75 MG PO TABS: 75.0000 mg | ORAL_TABLET | Freq: Every day | ORAL | 3 refills | Status: DC

## 2018-06-19 NOTE — Evaluation (Signed)
Physical Therapy Evaluation Patient Details Name: Patrick Giles MRN: [id number] DOB: May 28, 1933 Today's Date: 06/19/2018   History of Present Illness  Pt is an 84 y.o. male admitted 06/18/18 with c/o R-side weakness and numbess. CT negative. 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 PMH includes HTN, CAD s/p CABG, 4.7cm aortic aneurysm (stable past 3 yrs per pt report).    Clinical Impression  Pt presents with an overall decrease in functional mobility secondary to above. PTA, pt indep, drives and remains active; lives with wife who ambs with rollator; other family nearby to assist PRN. Today, pt ambulatory at supervision-level; stability improved with use of RW secondary to RLE deficits. Pt would benefit from continued acute PT services to maximize functional mobility and independence prior to d/c with outpatient neuro PT.     Follow Up Recommendations Outpatient PT (neuro);Supervision for mobility/OOB    Equipment Recommendations  Rolling walker with 5" wheels    Recommendations for Other Services       Precautions / Restrictions Precautions Precautions: Fall Restrictions Weight Bearing Restrictions: No      Mobility  Bed Mobility               General bed mobility comments: Received sitting in recliner with PT  Transfers Overall transfer level: Needs assistance   Transfers: Sit to/from Stand Sit to Stand: Supervision            Ambulation/Gait Ambulation/Gait assistance: Supervision;Min guard Gait Distance (Feet): 200 Feet Assistive device: None;Rolling walker (2 wheeled) Gait Pattern/deviations: Step-through pattern;Decreased stride length;Decreased weight shift to right;Decreased dorsiflexion - right Gait velocity: Decreased Gait velocity interpretation: 1.31 - 2.62 ft/sec, indicative of limited community ambulator General Gait Details: Very slowed, slightly unsteady amb without RW; min guard for balance. Stablity improved with use  of RW; supervision for safety  Stairs Stairs: (Simulated ascending steps with RUE support on rail; cues for technique ascending with stronger L foot and descending with R foot)          Wheelchair Mobility    Modified Rankin (Stroke Patients Only) Modified Rankin (Stroke Patients Only) Pre-Morbid Rankin Score: No symptoms Modified Rankin: Moderately severe disability     Balance Overall balance assessment: Needs assistance   Sitting balance-Leahy Scale: Good       Standing balance-Leahy Scale: Fair                               Pertinent Vitals/Pain Pain Assessment: No/denies pain    Home Living Family/patient expects to be discharged to:: Private residence Living Arrangements: Spouse/significant other Available Help at Discharge: Family Type of Home: House Home Access: Stairs to enter Entrance Stairs-Rails: None Secretary/administrator of Steps: 3 Home Layout: Multi-level Home Equipment: Bedside commode Additional Comments: Spouse uses rollator    Prior Function Level of Independence: Independent         Comments: Pt is very active. Golfer, plays in tournaments. Walks etc. Indep with all ADL and IADL's.     Hand Dominance   Dominant Hand: Right    Extremity/Trunk Assessment   Upper Extremity Assessment Upper Extremity Assessment: RUE deficits/detail RUE Deficits / Details: At least 3/5 throughout; slowed, impaired coordination RUE Coordination: decreased fine motor    Lower Extremity Assessment Lower Extremity Assessment: Overall WFL for tasks assessed;RLE deficits/detail RLE Deficits / Details: C/o feeling like "a shell" is around RLE; strength 5/5 RLE Sensation: decreased light touch    Cervical /  Trunk Assessment Cervical / Trunk Assessment: Normal  Communication   Communication: HOH  Cognition Arousal/Alertness: Awake/alert Behavior During Therapy: WFL for tasks assessed/performed Overall Cognitive Status: Within Functional  Limits for tasks assessed                                        General Comments General comments (skin integrity, edema, etc.): Daugher present, she works as Armed forces operational officer     Assessment/Plan    PT Assessment Patient needs continued PT services  PT Problem List Decreased strength;Decreased activity tolerance;Decreased balance;Decreased mobility;Decreased knowledge of use of DME;Impaired sensation       PT Treatment Interventions DME instruction;Gait training;Stair training;Functional mobility training;Therapeutic activities;Therapeutic exercise;Balance training;Neuromuscular re-education;Patient/family education    PT Goals (Current goals can be found in the Care Plan section)  Acute Rehab PT Goals Patient Stated Goal: Return home; get back to golf; play with puppy PT Goal Formulation: With patient Time For Goal Achievement: 07/03/18 Potential to Achieve Goals: Good    Frequency Min 4X/week   Barriers to discharge        Co-evaluation               AM-PAC PT "6 Clicks" Daily Activity  Outcome Measure Difficulty turning over in bed (including adjusting bedclothes, sheets and blankets)?: None Difficulty moving from lying on back to sitting on the side of the bed? : None Difficulty sitting down on and standing up from a chair with arms (e.g., wheelchair, bedside commode, etc,.)?: A Little Help needed moving to and from a bed to chair (including a wheelchair)?: A Little Help needed walking in hospital room?: A Little Help needed climbing 3-5 steps with a railing? : A Little 6 Click Score: 20    End of Session Equipment Utilized During Treatment: Gait belt Activity Tolerance: Patient tolerated treatment well Patient left: in chair;with call bell/phone within reach;with family/visitor present Nurse Communication: Mobility status PT Visit Diagnosis: Other abnormalities of gait and mobility (R26.89);Other symptoms and signs involving the nervous  system (Z61.096)    Time: 0454-0981 PT Time Calculation (min) (ACUTE ONLY): 18 min   Charges:   PT Evaluation $PT Eval Moderate Complexity: 1 Mod         Ina Homes, PT, DPT Acute Rehabilitation Services  Pager [phone] Office [phone]  Malachy Chamber 06/19/2018, 9:55 AM

## 2018-06-19 NOTE — Significant Event (Signed)
Taken to transportation by NT via wheelchair with daughter and spouse.    Patrick Giles

## 2018-06-19 NOTE — Progress Notes (Signed)
STROKE TEAM PROGRESS NOTE   HISTORY OF PRESENT ILLNESS (per record) Patrick Giles is a 84 y.o. male with a history of CAD, hypertension, hyperlipidemia who presents with right-sided weakness that was present on awakening.  He states that he went to bed feeling normal, when he woke up he noticed that he was having problems with his right side.  Due to this he activated 911 and EMS was concerned that he might have some neglect and therefore activated a code stroke for the 24-hour window.  He does take BP meds, but does nto currently take any blood thinners. He was on elliquis previously due to DVT, but is not currently.   LKW: 10 PM tpa given?: no, out of window   SUBJECTIVE (INTERVAL HISTORY) His  Family member is at the bedside.  He states he is improving and most of his right-sided strength has returned.   OBJECTIVE Vitals:   06/19/18 0009 06/19/18 0200 06/19/18 0400 06/19/18 0915  BP: (!) 143/83 (!) 151/91 (!) 160/90 (!) 148/76  Pulse:    68  Resp: 20 18 20    Temp:   97.7 F (36.5 C) 98.3 F (36.8 C)  TempSrc:   Oral Oral  SpO2: 96% 97% 97% 96%  Weight:      Height:        CBC:  Recent Labs  Lab 06/18/18 1001 06/18/18 1010  WBC 7.0  --   NEUTROABS 4.5  --   HGB 13.6 13.9  HCT 42.0 41.0  MCV 93.8  --   PLT 169  --     Basic Metabolic Panel:  Recent Labs  Lab 06/18/18 1001 06/18/18 1010  NA 139 140  K 4.7 4.7  CL 105 105  CO2 26  --   GLUCOSE 129* 127*  BUN 23 32*  CREATININE 1.65* 1.70*  CALCIUM 9.8  --     Lipid Panel:     Component Value Date/Time   CHOL 135 06/19/2018 0437   CHOL 147 01/18/2018 0932   TRIG 115 06/19/2018 0437   HDL 44 06/19/2018 0437   HDL 54 01/18/2018 0932   CHOLHDL 3.1 06/19/2018 0437   VLDL 23 06/19/2018 0437   LDLCALC 68 06/19/2018 0437   LDLCALC 63 01/18/2018 0932   HgbA1c:  Lab Results  Component Value Date   HGBA1C 6.3 08/01/2014   Urine Drug Screen: No results found for: LABOPIA, COCAINSCRNUR, LABBENZ,  AMPHETMU, THCU, LABBARB  Alcohol Level No results found for: ETH  IMAGING  Ct Angio Head W Or Wo Contrast Ct Angio Neck W Or Wo Contrast 06/18/2018 IMPRESSION:  Calcified plaque at both carotid bifurcation regions but without stenosis. Calcified plaque at both vertebral artery origins with narrowing estimated at 30-50% on each side. Atherosclerotic disease in both carotid siphon regions with supraclinoid ICA stenoses estimated at 50-70% on each side. No more distal vessel stenosis or occlusion identified.    Mr Brain Wo Contrast 06/18/2018 IMPRESSION:  1 cm area of acute infarction, nonhemorrhagic, LEFT posterior frontal cortex and subcortical white matter near the vertex. Atrophy and small vessel disease. No emergent proximal large vessel occlusion. Chronic, possibly superimposed acute, RIGHT otitis and mastoiditis, possible cholesteatoma. Correlate clinically for conductive hearing loss or otalgia.    Ct Head Code Stroke Wo Contrast 06/18/2018 IMPRESSION:  1. Chronic appearing left external capsule lacunar infarct. Otherwise normal for age noncontrast CT appearance of the brain.  2. ASPECTS is 10 (chronic left deep white matter lacunar infarct suspected).  3. Right EAC,  middle ear, and mastoid inflammatory process - probably acute on chronic.     Transthoracic Echocardiogram  06/18/2018 Study Conclusions - Left ventricle: LV function is grossly normal. The cavity size   was normal. Wall thickness was normal. - Impressions: Extremely poor acoustic windows limit study   signficantly. Consider other method of evaluateion (TEE, CT, MRI)   if clinically indicated. WOuld , at least, recomm CT evaluation   of aorta at some point as it appears to be mildly dilated. Could   not accurately messure in present study. Impressions: - Extremely poor acoustic windows limit study signficantly.   Consider other method of evaluateion (TEE, CT, MRI) if clinically   indicated. WOuld , at least,  recomm CT evaluation of aorta at   some point as it appears to be mildly dilated. Could not   accurately messure in present study.   PHYSICAL EXAM Blood pressure (!) 148/76, pulse 68, temperature 98.3 F (36.8 C), temperature source Oral, resp. rate 20, height 5\' 11"  (1.803 m), weight 94.3 kg, SpO2 96 %. Pleasant obese elderly Caucasian male currently not in distress. . Afebrile. Head is nontraumatic. Neck is supple without bruit.    Cardiac exam no murmur or gallop. Lungs are clear to auscultation. Distal pulses are well felt. Neurological Exam :  Awake alert oriented 3 with normal speech and language function. No aphasia, dysarthria or apraxia. Extraocular moments are full range without nystagmus. Blinks to threat bilaterally. Visual fields are full to bedside confrontational testing. Fundi were not visualized. Face is symmetric without weakness. Tongue midline. Motor system exam reveals mild right lower extremity drift. Mild weakness of right grip, right hip flexors and ankle dorsiflexors only. Diminished fine finger movements on the right. Orbits left over right upper extremity.      ASSESSMENT/PLAN Patrick Giles is a 84 y.o. male with history of CAD, Htn, Hld,  presenting with right sided weakness. He did not receive IV t-PA due to late presentation.   Stroke: acute infarction, Lt posterior frontal cortex and subcortical white matter near the vertex - likely atheroembolic from cavernous carotid stenosis versus small vessel disease  Resultant  Mild right hemiparesis  CT head - Chronic appearing left external capsule lacunar infarct.  MRI head - 1 cm area of acute infarction, nonhemorrhagic, LEFT posterior frontal cortex and subcortical white matter near the vertex.  MRA head - not performed  CTA H&N - bilateral supraclinoid ICA stenoses estimated at 50-70% on each side  Carotid Doppler - CTA neck performed or pending - carotid dopplers not indicated.  2D Echo -  Extremely  poor acoustic windows limit study signficantly - consider TEE.   LDL -  68  HgbA1c - pending  VTE prophylaxis -  - Lovenox  Diet  - Heart healthy with thin liquids.  aspirin 81 mg daily prior to admission, now on aspirin 81 mg daily and clopidogrel 75 mg daily  Patient counseled to be compliant with his antithrombotic medications  Ongoing aggressive stroke risk factor management  Therapy recommendations:  Outpatient PT and OT  Disposition:  Pending  Hypertension  Stable . Permissive hypertension (OK if < 220/120) but gradually normalize in 5-7 days . Long-term BP goal normotensive  Hyperlipidemia  Lipid lowering medication PTA: Lipitor 40 mg daily  LDL 68, goal < 70  Current lipid lowering medication: Lipitor 80 mg daily  Continue statin at discharge   Other Stroke Risk Factors  Former cigarette smoker - quit  Obesity, Body mass index is  29 kg/m., recommend weight loss, diet and exercise as appropriate   Hx stroke/TIA - Chronic appearing left external capsule lacunar infarct by Head CT  Coronary artery disease   Other Active Problems  Chronic, possibly superimposed acute, RIGHT otitis and mastoiditis, possible cholesteatoma. Correlate clinically for conductive hearing loss or otalgia.    PLAN   Aspirin 81 mg and Plavix 75 mg for three weeks then Plavix alone.  Aggressive risk factor modification.  Outpatient physical occupational therapy consults  Follow-up as an outpatient in stroke clinic in 6 weeks.   Hospital day # 0  I have personally examined this patient, reviewed notes, independently viewed imaging studies, participated in medical decision making and plan of care.ROS completed by me personally and pertinent positives fully documented  I have made any additions or clarifications directly to the above note.  Long discussion with patient and family member at the bedside as well as with Dr. Thedore Mins and answered questions. Greater than 50% of time  during this 35 minute visit was spent in counseling and coordination of care about his subcortical infarct in internal carotid stenosis and need for antiplatelet therapy and aggressive risk factor modification to prevent further strokes and answered questions. Delia Heady, MD Medical Director [facility] Pager: [phone] 06/19/2018 2:11 PM   To contact Stroke Continuity provider, please refer to [email or web address]. After hours, contact General Neurology

## 2018-06-19 NOTE — Evaluation (Signed)
Speech Language Pathology Evaluation Patient Details Name: Patrick Giles MRN: [id number] DOB: 22-Apr-1933 Today's Date: 06/19/2018 Time: 1191-4782 SLP Time Calculation (min) (ACUTE ONLY): 29 min  Problem List:  Patient Active Problem List   Diagnosis Date Noted  . Acute CVA (cerebrovascular accident) (HCC) 06/19/2018  . CVA (cerebral vascular accident) (HCC) 06/18/2018  . Thoracic aortic aneurysm without rupture (HCC) 01/18/2018  . Ascending aorta dilatation (HCC) 01/18/2017  . Essential hypertension 07/24/2014  . History of DVT (deep vein thrombosis) 07/24/2014  . DVT, lower extremity (HCC) 07/23/2014  . CAD (coronary artery disease) 11/18/2013  . S/P CABG x 3 11/18/2013  . Cardiomyopathy, ischemic 11/18/2013  . Dyslipidemia 11/18/2013  . CKD (chronic kidney disease) stage 3, GFR 30-59 ml/min (HCC) 11/18/2013  . Impaired fasting glucose 11/18/2013   Past Medical History:  Past Medical History:  Diagnosis Date  . CAD (coronary artery disease)   . Dyslipidemia   . Hypertension   . S/P CABG (coronary artery bypass graft) 1998   LIMA to LAD, VG to PDA, VG to first diagonal   Past Surgical History:  Past Surgical History:  Procedure Laterality Date  . CHOLECYSTECTOMY  1998  . CORONARY ARTERY BYPASS GRAFT  07/08/1997   LIMA to LAD, reverse SVG to PDA, reverse SVG to first diagonal (Dr. Wynetta Fines)  . TRANSTHORACIC ECHOCARDIOGRAM  11/29/2012   EF 45-50%, grade 1 diastolic dysfunction   HPI:  Pt is an 84 y.o. male admitted 06/18/18 with c/o R-side weakness and numbess. CT negative. 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 PMH includes HTN, CAD s/p CABG, 4.7cm aortic aneurysm (stable past 3 yrs per pt report).    Assessment / Plan / Recommendation Clinical Impression  Pt scored a 26/30 on the MOCA, primarily having difficulty during the delayed recall subtest. Pt was able to recall 5/5 words when given categorical cues. He describes having mild memory  difficulties at baseline and believes that he is still at his baseline. Family present is in agreement. Given the above, SLP f/u not indicated. However, pt was encouraged to have additional supervision upon initial return home as a safety precaution, particularly with more complex tasks. SLP to sign off.    SLP Assessment  SLP Recommendation/Assessment: Patient does not need any further Speech Lanaguage Pathology Services SLP Visit Diagnosis: Cognitive communication deficit (R41.841)    Follow Up Recommendations  Other (comment)(intermittent supervision)    Frequency and Duration           SLP Evaluation Cognition  Overall Cognitive Status: History of cognitive impairments - at baseline(mild memory impairment)       Comprehension  Auditory Comprehension Overall Auditory Comprehension: Appears within functional limits for tasks assessed    Expression Expression Primary Mode of Expression: Verbal Verbal Expression Overall Verbal Expression: Appears within functional limits for tasks assessed   Oral / Motor  Motor Speech Overall Motor Speech: Appears within functional limits for tasks assessed   GO                    Maxcine Ham 06/19/2018, 2:43 PM  Maxcine Ham, M.A. CCC-SLP Acute Herbalist [phone] Office [phone]

## 2018-06-19 NOTE — Care Management Note (Signed)
Case Management Note  Patient Details  Name: Patrick Giles MRN: [id number] Date of Birth: 11/21/1932  Subjective/Objective:          CVA. From home with wife.           Johny Blamer (Spouse) Star Age ([phone]     PCP: Antony Haste  Action/Plan: Transition to home with outpatient PT to follow. Family to provide transportation to home.  Expected Discharge Date:  06/19/18               Expected Discharge Plan:  Home/Self Care  In-House Referral:     Discharge planning Services  CM Consult  Post Acute Care Choice:    Choice offered to:     DME Arranged:  Walker(declined BSC) DME Agency:  [facility]  HH Arranged:  NA HH Agency:  NA  Status of Service:  Completed, signed off  If discussed at Long Length of Stay Meetings, dates discussed:  completed  Additional Comments:  Epifanio Lesches, RN 06/19/2018, 3:21 PM

## 2018-06-19 NOTE — Significant Event (Signed)
AVS reviewed with patient and daughter. Patient verbalize understanding of content in AVS including medications, upcoming appointments, activity, and signs/symptoms of stroke. Patient's walker is delivered at bedside. Awaiting on patient's spouse for transportation to home.    New update: a prescription of protonix given to patient and provided education patient regarding it.    Patrick Giles

## 2018-06-19 NOTE — Discharge Summary (Signed)
Patrick Giles EXB:[id number] DOB: 11-25-1932 DOA: 06/18/2018  PCP: Eartha Inch, MD  Admit date: 06/18/2018  Discharge date: 06/19/2018  Admitted From: Home   Disposition:  Home   Recommendations for Outpatient Follow-up:   Follow up with PCP in 1-2 weeks  PCP Please obtain BMP/CBC, 2 view CXR in 1week,  (see Discharge instructions)   PCP Please follow up on the following pending results: Follow secondary risk factors for stroke, follow A1c results which are pending.   Home Health: Outpt PT   Equipment/Devices: 3 in 1   Consultations: Neuro Discharge Condition: Stable   CODE STATUS: Full   Diet Recommendation: Heart Healthy   Chief Complaint  Patient presents with  . Code Stroke     Brief history of present illness from the day of admission and additional interim summary    Patrick Giles is a 84 y.o. male with history h/o CAD status post CABG, hypertension, hyperlipidemia, 4.7 cm aortic aneurysm (which has been stable over the last 3 years per patient report), presents today with complaints of right-sided weakness and numbness that he noticed last night at 6:30 PM.  Patient apparently was sitting and watching a football game when he felt that his right foot was numb when stuck to the floor, he came to hospital and was found to have a CVA.                                                                 Hospital Course    1.  Right-sided weakness.  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  Seen by neuro team.  No speech or swallowing issues, will be placed on 3 weeks of aspirin and Plavix along with his home dose statin, after 3 weeks continue Plavix only.  LDL was satisfactory, A1 is pending PCP to monitor.  He will be  discharged home with outpatient PT along with PCP and outpatient neurology follow-up.   2.  Hypertension.  Continue home dose beta-blocker, requested to resume his Norvasc/ACE inhibitor combination from 06/21/2018 to allow for some permissive hypertension due to acute stroke.  3.  CAD status post CABG.  Dual antiplatelet therapy as above, continue statin and beta-blocker for secondary prevention no acute issues.  4.  Dyslipidemia.  Stable LDL at goal.  Continue home dose statin unchanged.   Discharge diagnosis     Active Problems:   CKD (chronic kidney disease) stage 3, GFR 30-59 ml/min (HCC)   Essential hypertension   History of DVT (deep vein thrombosis)   CVA (cerebral vascular accident) (HCC)   Acute CVA (cerebrovascular accident) [facility])    Discharge instructions    Discharge Instructions    Ambulatory referral to Physical Therapy  Complete by:  As directed    Diet - low sodium heart healthy   Complete by:  As directed    Discharge instructions   Complete by:  As directed    Take aspirin and Plavix for 3 weeks.  After 3 weeks stop aspirin and continue Plavix indefinitely.   Resume Lotrel blood pressure medication from 06/21/2018  Follow with Primary MD Eartha Inch, MD in 7 days   Get CBC, CMP, 2 view Chest X ray checked  by Primary MD or SNF MD in 5-7 days    Activity: As tolerated with Full fall precautions use walker/cane & assistance as needed  Disposition Home    Diet: Heart Healthy     For Heart failure patients - Check your Weight same time everyday, if you gain over 2 pounds, or you develop in leg swelling, experience more shortness of breath or chest pain, call your Primary MD immediately. Follow Cardiac Low Salt Diet and 1.5 lit/day fluid restriction.  Special Instructions: If you have smoked or chewed Tobacco  in the last 2 yrs please stop smoking, stop any regular Alcohol  and or any Recreational drug use.  On your next visit with your primary care  physician please Get Medicines reviewed and adjusted.  Please request your Prim.MD to go over all Hospital Tests and Procedure/Radiological results at the follow up, please get all Hospital records sent to your Prim MD by signing hospital release before you go home.  If you experience worsening of your admission symptoms, develop shortness of breath, life threatening emergency, suicidal or homicidal thoughts you must seek medical attention immediately by calling 911 or calling your MD immediately  if symptoms less severe.  You Must read complete instructions/literature along with all the possible adverse reactions/side effects for all the Medicines you take and that have been prescribed to you. Take any new Medicines after you have completely understood and accpet all the possible adverse reactions/side effects.   Increase activity slowly   Complete by:  As directed       Discharge Medications   Allergies as of 06/19/2018   No Known Allergies     Medication List    TAKE these medications   ALIVE MENS ENERGY PO Take 1 tablet by mouth daily.   amLODipine-benazepril 5-20 MG capsule Commonly known as:  LOTREL Take 1 capsule by mouth daily. Start taking on:  06/21/2018 What changed:  These instructions start on 06/21/2018. If you are unsure what to do until then, ask your doctor or other care provider.   aspirin 81 MG tablet Take 81 mg by mouth daily.   atorvastatin 40 MG tablet Commonly known as:  LIPITOR TAKE 1 TABLET BY MOUTH EVERY DAY AT 6PM What changed:  See the new instructions.   clopidogrel 75 MG tablet Commonly known as:  PLAVIX Take 1 tablet (75 mg total) by mouth daily. Start taking on:  06/20/2018   Fish Oil 1200 MG Caps Take 2 capsules by mouth daily.   GARLIC PO Take 2,956 mg by mouth daily.   metoprolol succinate 25 MG 24 hr tablet Commonly known as:  TOPROL-XL TAKE 1/2 A TABLET BY MOUTH EVERY DAY What changed:  See the new instructions.   pantoprazole 40  MG tablet Commonly known as:  PROTONIX Take 1 tablet (40 mg total) by mouth daily.            Durable Medical Equipment  (From admission, onward)  Start     Ordered   06/19/18 1326  For home use only DME Walker rolling  Once    Question:  Patient needs a walker to treat with the following condition  Answer:  Weakness   06/19/18 1325   06/19/18 1125  For home use only DME 3 n 1  Once    Comments:  CVA   06/19/18 1124          Follow-up Information    Eartha Inch, MD. Schedule an appointment as soon as possible for a visit in 1 week(s).   Specialty:  Family Medicine Contact information: [place] [address] [place] [address] [phone]        Micki Riley, MD. Schedule an appointment as soon as possible for a visit in 2 week(s).   Specialties:  Neurology, Radiology Contact information: [address] [place] [address] [phone]        Larina Earthly, MD. Schedule an appointment as soon as possible for a visit in 2 week(s).   Specialties:  Vascular Surgery, Cardiology Why:  Carotid artery disease Contact information: [address] [place] [address] [phone]        [facility] Follow up.   Specialty:  Rehabilitation Why:  referral made for outpatient PT, office will call and setup appointment time Contact information: [address] [id number] mc [place] [address] [phone]          Major procedures and Radiology Reports - PLEASE review detailed and final reports thoroughly  -     TTE -  - Left ventricle: LV function is grossly normal. The cavity size   was normal. Wall thickness was normal. - Impressions: Extremely poor acoustic windows limit study   signficantly. Consider other method of evaluateion (TEE, CT, MRI)   if clinically indicated. WOuld , at least, recomm CT evaluation   of aorta at some point as it appears to be mildly  dilated. Could   not accurately messure in present study.   Extremely poor acoustic windows limit study signficantly.   Consider other method of evaluateion (TEE, CT, MRI) if clinically   indicated. WOuld , at least, recomm CT evaluation of aorta at   some point as it appears to be mildly dilated. Could not   accurately messure in present study.   Ct Angio Head W Or Wo Contrast  Result Date: 06/18/2018 CLINICAL DATA:  Right-sided weakness. Last seen normal 1830 hours yesterday EXAM: CT ANGIOGRAPHY HEAD AND NECK TECHNIQUE: Multidetector CT imaging of the head and neck was performed using the standard protocol during bolus administration of intravenous contrast. Multiplanar CT image reconstructions and MIPs were obtained to evaluate the vascular anatomy. Carotid stenosis measurements (when applicable) are obtained utilizing NASCET criteria, using the distal internal carotid diameter as the denominator. CONTRAST:  50mL ISOVUE-370 IOPAMIDOL (ISOVUE-370) INJECTION 76% COMPARISON:  CT earlier same day FINDINGS: CTA NECK FINDINGS Aortic arch: Aortic atherosclerosis. Right carotid system: Common carotid artery is tortuous but widely patent to the bifurcation. There is calcified plaque at the distal common carotid artery and carotid bifurcation. Minimal diameter of the proximal ICA is 5 mm, therefore there is no stenosis. Left carotid system: Common carotid artery is widely patent to the bifurcation. There is calcified plaque at the distal common carotid artery and carotid bifurcation region. Minimal diameter of the proximal ICA is 6 mm, no stenosis. Cervical ICA is tortuous but widely patent beyond that. Vertebral arteries:  Left vertebral artery is dominant. There is calcified plaque at each vertebral artery origin. 30-50% stenosis at each vertebral artery origin. Beyond that, the vessels are widely patent through the cervical region to the foramen magnum. Skeleton: Mid cervical spondylosis. Other neck: No soft  tissue mass. Upper chest: Elevated left hemidiaphragm with left base volume loss. Otherwise negative. Review of the MIP images confirms the above findings CTA HEAD FINDINGS Anterior circulation: Both internal carotid arteries are widely patent through the skull base and to the siphon regions. There is siphon atherosclerotic calcification. There is atherosclerotic narrowing of the supraclinoid internal carotid arteries on each side, estimated at 50-70%. Beyond that, the anterior and middle cerebral vessels are patent without evidence of stenosis or missing branch vessel. Posterior circulation: There is atherosclerotic calcification at the foramen magnum level but no stenosis. The small right vertebral artery terminates in PICA. The dominant left vertebral artery is widely patent to the basilar. No basilar stenosis. Posterior circulation branch vessels are patent. Venous sinuses: Patent and normal Anatomic variants: None significant Delayed phase: No abnormal enhancement Review of the MIP images confirms the above findings IMPRESSION: Calcified plaque at both carotid bifurcation regions but without stenosis. Calcified plaque at both vertebral artery origins with narrowing estimated at 30-50% on each side. Atherosclerotic disease in both carotid siphon regions with supraclinoid ICA stenoses estimated at 50-70% on each side. No more distal vessel stenosis or occlusion identified. These results were communicated to Dr. Amada Jupiter At 10:39 amon 9/30/2019by text page via the [facility] messaging system. Electronically Signed   By: Paulina Fusi M.D.   On: 06/18/2018 10:40   Ct Angio Neck W Or Wo Contrast  Result Date: 06/18/2018 CLINICAL DATA:  Right-sided weakness. Last seen normal 1830 hours yesterday EXAM: CT ANGIOGRAPHY HEAD AND NECK TECHNIQUE: Multidetector CT imaging of the head and neck was performed using the standard protocol during bolus administration of intravenous contrast. Multiplanar CT image reconstructions  and MIPs were obtained to evaluate the vascular anatomy. Carotid stenosis measurements (when applicable) are obtained utilizing NASCET criteria, using the distal internal carotid diameter as the denominator. CONTRAST:  50mL ISOVUE-370 IOPAMIDOL (ISOVUE-370) INJECTION 76% COMPARISON:  CT earlier same day FINDINGS: CTA NECK FINDINGS Aortic arch: Aortic atherosclerosis. Right carotid system: Common carotid artery is tortuous but widely patent to the bifurcation. There is calcified plaque at the distal common carotid artery and carotid bifurcation. Minimal diameter of the proximal ICA is 5 mm, therefore there is no stenosis. Left carotid system: Common carotid artery is widely patent to the bifurcation. There is calcified plaque at the distal common carotid artery and carotid bifurcation region. Minimal diameter of the proximal ICA is 6 mm, no stenosis. Cervical ICA is tortuous but widely patent beyond that. Vertebral arteries: Left vertebral artery is dominant. There is calcified plaque at each vertebral artery origin. 30-50% stenosis at each vertebral artery origin. Beyond that, the vessels are widely patent through the cervical region to the foramen magnum. Skeleton: Mid cervical spondylosis. Other neck: No soft tissue mass. Upper chest: Elevated left hemidiaphragm with left base volume loss. Otherwise negative. Review of the MIP images confirms the above findings CTA HEAD FINDINGS Anterior circulation: Both internal carotid arteries are widely patent through the skull base and to the siphon regions. There is siphon atherosclerotic calcification. There is atherosclerotic narrowing of the supraclinoid internal carotid arteries on each side, estimated at 50-70%. Beyond that, the anterior and middle cerebral vessels are patent without evidence of stenosis or missing branch vessel. Posterior circulation:  There is atherosclerotic calcification at the foramen magnum level but no stenosis. The small right vertebral artery  terminates in PICA. The dominant left vertebral artery is widely patent to the basilar. No basilar stenosis. Posterior circulation branch vessels are patent. Venous sinuses: Patent and normal Anatomic variants: None significant Delayed phase: No abnormal enhancement Review of the MIP images confirms the above findings IMPRESSION: Calcified plaque at both carotid bifurcation regions but without stenosis. Calcified plaque at both vertebral artery origins with narrowing estimated at 30-50% on each side. Atherosclerotic disease in both carotid siphon regions with supraclinoid ICA stenoses estimated at 50-70% on each side. No more distal vessel stenosis or occlusion identified. These results were communicated to Dr. Amada Jupiter At 10:39 amon 9/30/2019by text page via the [facility] messaging system. Electronically Signed   By: Paulina Fusi M.D.   On: 06/18/2018 10:40   Mr Brain Wo Contrast  Result Date: 06/18/2018 CLINICAL DATA:  84 year old male with RIGHT-sided weakness, last seen well 1830 hours 06/17/2018. EXAM: MRI HEAD WITHOUT CONTRAST TECHNIQUE: Multiplanar, multiecho pulse sequences of the brain and surrounding structures were obtained without intravenous contrast. COMPARISON:  CT head without contrast. CTA head neck with contrast. Both earlier today. FINDINGS: Brain: 1 cm area of restricted diffusion, LEFT posterior frontal cortex and subcortical white matter, near the vertex, corresponding low ADC, no hemorrhage, consistent with acute infarction. No mass lesion, hydrocephalus, or extra-axial fluid. Mild to moderate atrophy, not unexpected for age. T2 and FLAIR hyperintensities in the periventricular and subcortical white matter, representing small vessel disease. Scattered chronic lacunar infarcts most notably in the LEFT basal ganglia. Vascular: Flow voids are maintained. No emergent large vessel occlusion. Skull and upper cervical spine: Mild pannus. Skull base demonstrates normal marrow signal. No tonsillar  herniation. Normal pituitary. Sinuses/Orbits: Clear paranasal sinuses. No orbital findings of significance. Other: BILATERAL middle ear and mastoid fluid, worse on the RIGHT, with coalescence suggesting chronic otitis and mastoiditis. Compared with recent CT and CTA, the infarct is not visible. IMPRESSION: 1 cm area of acute infarction, nonhemorrhagic, LEFT posterior frontal cortex and subcortical white matter near the vertex. Atrophy and small vessel disease. No emergent proximal large vessel occlusion. Chronic, possibly superimposed acute, RIGHT otitis and mastoiditis, possible cholesteatoma. Correlate clinically for conductive hearing loss or otalgia. Electronically Signed   By: Elsie Stain M.D.   On: 06/18/2018 16:58   Ct Head Code Stroke Wo Contrast  Result Date: 06/18/2018 CLINICAL DATA:  Code stroke. 84 year old male with right side weakness. Last seen well 1830 hours yesterday. EXAM: CT HEAD WITHOUT CONTRAST TECHNIQUE: Contiguous axial images were obtained from the base of the skull through the vertex without intravenous contrast. COMPARISON:  None. FINDINGS: Brain: Well-developed hypodensity in the left anterior limb external capsule most resembling chronic small-vessel lacune (coronal image 30). Elsewhere gray-white matter differentiation appears normal for age. No midline shift, ventriculomegaly, mass effect, evidence of mass lesion, intracranial hemorrhage or evidence of cortically based acute infarction. Vascular: Dominant left vertebral artery. Calcified atherosclerosis at the skull base. No suspicious intracranial vascular hyperdensity. Skull: Negative. Sinuses/Orbits: Right middle ear, mastoid and external auditory canal opacification. This appears inflammatory. There is asymmetric right mastoid sclerosis. Trace contralateral left mastoid effusion. Negative visible nasopharynx. The visible paranasal sinus and left tympanic cavity are well pneumatized. Other: Negative orbit and scalp soft  tissues. ASPECTS [facility] Stroke Program Early CT Score) - Ganglionic level infarction (caudate, lentiform nuclei, internal capsule, insula, M1-M3 cortex): 7 - Supraganglionic infarction (M4-M6 cortex): 3 Total score (0-10 with 10  being normal): 10 IMPRESSION: 1. Chronic appearing left external capsule lacunar infarct. Otherwise normal for age noncontrast CT appearance of the brain. 2. ASPECTS is 10 (chronic left deep white matter lacunar infarct suspected). 3. Right EAC, middle ear, and mastoid inflammatory process - probably acute on chronic. 4. These results were communicated to Dr. Amada Jupiter at 10:20 amon 9/30/2019by text page via the [facility] messaging system. Electronically Signed   By: Odessa Fleming M.D.   On: 06/18/2018 10:20    Micro Results   No results found for this or any previous visit (from the past 240 hour(s)).  Today   Subjective    Tyree Fluharty today has no headache,no chest abdominal pain,no new weakness tingling or numbness, feels much better wants to go home today.    Objective   Blood pressure 134/74, pulse 77, temperature 98 F (36.7 C), temperature source Oral, resp. rate 20, height 5\' 11"  (1.803 m), weight 94.3 kg, SpO2 97 %.   Intake/Output Summary (Last 24 hours) at 06/19/2018 1422 Last data filed at 06/19/2018 0400 Gross per 24 hour  Intake -  Output 275 ml  Net -275 ml    Exam Awake Alert, Oriented x 3, No new F.N deficits, minimal right-sided weakness rapidly improving, normal affect [place].AT,PERRAL Supple Neck,No JVD, No cervical lymphadenopathy appriciated.  Symmetrical Chest wall movement, Good air movement bilaterally, CTAB RRR,No Gallops,Rubs or new Murmurs, No Parasternal Heave +ve B.Sounds, Abd Soft, Non tender, No organomegaly appriciated, No rebound -guarding or rigidity. No Cyanosis, Clubbing or edema, No new Rash or bruise   Data Review   CBC w Diff:  Lab Results  Component Value Date   WBC 7.0 06/18/2018   HGB 13.9 06/18/2018   HCT 41.0  06/18/2018   PLT 169 06/18/2018   LYMPHOPCT 22 06/18/2018   MONOPCT 12 06/18/2018   EOSPCT 1 06/18/2018   BASOPCT 1 06/18/2018    CMP:  Lab Results  Component Value Date   NA 140 06/18/2018   NA 140 01/18/2018   K 4.7 06/18/2018   CL 105 06/18/2018   CO2 26 06/18/2018   BUN 32 (H) 06/18/2018   BUN 25 01/18/2018   CREATININE 1.70 (H) 06/18/2018   CREATININE 1.83 (H) 01/11/2016   PROT 7.1 06/18/2018   ALBUMIN 3.8 06/18/2018   BILITOT 0.8 06/18/2018   ALKPHOS 50 06/18/2018   AST 26 06/18/2018   ALT 20 06/18/2018  . Lab Results  Component Value Date   HGBA1C 6.3 08/01/2014    Lab Results  Component Value Date   CHOL 135 06/19/2018   HDL 44 06/19/2018   LDLCALC 68 06/19/2018   TRIG 115 06/19/2018   CHOLHDL 3.1 06/19/2018     Total Time in preparing paper work, data evaluation and todays exam - 35 minutes  Susa Raring M.D on 06/19/2018 at 2:22 PM  [facility]   Office  [phone]

## 2018-06-19 NOTE — Discharge Instructions (Signed)
Take aspirin and Plavix for 3 weeks.  After 3 weeks stop aspirin and continue Plavix indefinitely.   Resume Lotrel blood pressure medication from 06/21/2018  Follow with Primary MD Eartha Inch, MD in 7 days   Get CBC, CMP, 2 view Chest X ray checked  by Primary MD or SNF MD in 5-7 days    Activity: As tolerated with Full fall precautions use walker/cane & assistance as needed  Disposition Home    Diet: Heart Healthy     For Heart failure patients - Check your Weight same time everyday, if you gain over 2 pounds, or you develop in leg swelling, experience more shortness of breath or chest pain, call your Primary MD immediately. Follow Cardiac Low Salt Diet and 1.5 lit/day fluid restriction.  Special Instructions: If you have smoked or chewed Tobacco  in the last 2 yrs please stop smoking, stop any regular Alcohol  and or any Recreational drug use.  On your next visit with your primary care physician please Get Medicines reviewed and adjusted.  Please request your Prim.MD to go over all Hospital Tests and Procedure/Radiological results at the follow up, please get all Hospital records sent to your Prim MD by signing hospital release before you go home.  If you experience worsening of your admission symptoms, develop shortness of breath, life threatening emergency, suicidal or homicidal thoughts you must seek medical attention immediately by calling 911 or calling your MD immediately  if symptoms less severe.  You Must read complete instructions/literature along with all the possible adverse reactions/side effects for all the Medicines you take and that have been prescribed to you. Take any new Medicines after you have completely understood and accpet all the possible adverse reactions/side effects.

## 2018-06-19 NOTE — Evaluation (Signed)
Occupational Therapy Evaluation Patient Details Name: Patrick Giles MRN: [id number] DOB: 30-Nov-1932 Today's Date: 06/19/2018    History of Present Illness Pt is an 84 y.o. male admitted 06/18/18 with c/o R-side weakness and numbess. CT negative. 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 PMH includes HTN, CAD s/p CABG, 4.7cm aortic aneurysm (stable past 3 yrs per pt report).   Clinical Impression   Pt admitted as above currently demonstrating deficits in strength and coordination impacting his ability to perform ADL's and leisure activities (Please refer to OT problem list below). He should benefit from acute OT followed by out-pt OT Neuro Rehab for strengthening, coordination and neuromuscular re-education of R dominant UE. Will follow acutely.    Follow Up Recommendations  Outpatient OT;Supervision - Intermittent    Equipment Recommendations  3 in 1 bedside commode    Recommendations for Other Services       Precautions / Restrictions Precautions Precautions: Fall Restrictions Weight Bearing Restrictions: No      Mobility Bed Mobility Overal bed mobility: (Received sitting up in recliner)             General bed mobility comments: Received sitting in recliner with PT  Transfers Overall transfer level: Needs assistance   Transfers: Sit to/from Stand;Stand Pivot Transfers Sit to Stand: Supervision Stand pivot transfers: Min guard            Balance Overall balance assessment: Needs assistance Sitting-balance support: Feet supported;No upper extremity supported Sitting balance-Leahy Scale: Good     Standing balance support: No upper extremity supported Standing balance-Leahy Scale: Fair                             ADL either performed or assessed with clinical judgement   ADL Overall ADL's : Needs assistance/impaired Eating/Feeding: Modified independent   Grooming: Wash/dry hands;Standing;Min guard Grooming Details  (indicate cue type and reason): Close supervision- Min guard Assist during grooming standing at sink. Pt was noted to lean forward at sink. Upper Body Bathing: Set up;Sitting;Modified independent   Lower Body Bathing: Sit to/from stand;Supervison/ safety;Min guard   Upper Body Dressing : Set up;Sitting   Lower Body Dressing: Min guard;Sit to/from stand   Toilet Transfer: Min guard;Ambulation;Grab bars Toilet Transfer Details (indicate cue type and reason): Pt will benefit from DME to assist with toilet height and possibly RW to stready due to RLE weakness. PT to assess. Anticipate that pt will be supervision level with necessary DME Toileting- Clothing Manipulation and Hygiene: Supervision/safety;Sitting/lateral lean       Functional mobility during ADLs: Min guard(+1 hand held assist ) General ADL Comments: Pt seen for OT assessment and participation in ADL retraining session this morning. Deficits with ADL's and coordination with generalized weakness should improve with out-pt OT referral as currently recommended. Will follow acutely.     Vision   Vision Assessment?: No apparent visual deficits     Perception     Praxis      Pertinent Vitals/Pain Pain Assessment: No/denies pain     Hand Dominance Right   Extremity/Trunk Assessment Upper Extremity Assessment Upper Extremity Assessment: RUE deficits/detail RUE Deficits / Details: At least 3/5 throughout; slowed, impaired coordination RUE Coordination: decreased fine motor   Lower Extremity Assessment Lower Extremity Assessment: Overall WFL for tasks assessed;RLE deficits/detail RLE Deficits / Details: C/o feeling like "a shell" is around RLE; strength 5/5 RLE Sensation: decreased light touch   Cervical / Trunk Assessment Cervical /  Trunk Assessment: Normal   Communication Communication Communication: HOH   Cognition Arousal/Alertness: Awake/alert Behavior During Therapy: WFL for tasks assessed/performed Overall  Cognitive Status: Within Functional Limits for tasks assessed                                     General Comments  Daugher present, she works as Dealer      Home Living Family/patient expects to be discharged to:: Private residence Living Arrangements: Spouse/significant other Available Help at Discharge: Family Type of Home: House Home Access: Stairs to enter Secretary/administrator of Steps: 3 Entrance Stairs-Rails: None Home Layout: Multi-level Alternate Level Stairs-Number of Steps: 2 steps into bedroom; 1-2 steps into living room (does not go to upper level bedroom) Alternate Level Stairs-Rails: Right Bathroom Shower/Tub: Chief Strategy Officer: Standard     Home Equipment: Bedside commode   Additional Comments: Spouse uses rollator      Prior Functioning/Environment Level of Independence: Independent        Comments: Pt is very active. Golfer, plays in tournaments. Walks etc. Indep with all ADL and IADL's.        OT Problem List: Decreased strength;Impaired UE functional use;Decreased coordination;Other (comment)(Sensibility to be further assessed)      OT Treatment/Interventions: Self-care/ADL training;DME and/or AE instruction;Therapeutic activities;Therapeutic exercise;Neuromuscular education;Patient/family education    OT Goals(Current goals can be found in the care plan section) Acute Rehab OT Goals Patient Stated Goal: Get home and back to playing golf OT Goal Formulation: With patient Time For Goal Achievement: 07/03/18 Potential to Achieve Goals: Good  OT Frequency: Min 2X/week   Barriers to D/C:            Co-evaluation              AM-PAC PT "6 Clicks" Daily Activity     Outcome Measure Help from another person eating meals?: None Help from another person taking care of personal grooming?: A Little Help from another person toileting, which includes using toliet, bedpan,  or urinal?: A Little Help from another person bathing (including washing, rinsing, drying)?: A Little Help from another person to put on and taking off regular upper body clothing?: None Help from another person to put on and taking off regular lower body clothing?: A Little 6 Click Score: 20   End of Session Equipment Utilized During Treatment: Gait belt;Other (comment)(1 hand held assist)  Activity Tolerance: Patient tolerated treatment well Patient left: in chair;with call bell/phone within reach;with family/visitor present;Other (comment)(PT in room for assessment)  OT Visit Diagnosis: Muscle weakness (generalized) (M62.81);Unsteadiness on feet (R26.81)                Time: 1610-9604 OT Time Calculation (min): 23 min Charges:  OT General Charges $OT Visit: 1 Visit OT Evaluation $OT Eval Low Complexity: 1 Low OT Treatments $Self Care/Home Management : 8-22 mins    Lowen Barringer Beth Dixon, OTR/L 06/19/2018, 10:21 AM

## 2018-06-20 LAB — HEMOGLOBIN A1C
HEMOGLOBIN A1C: 5.9 % — AB (ref 4.8–5.6)
MEAN PLASMA GLUCOSE: 123 mg/dL

## 2018-06-27 ENCOUNTER — Encounter: Payer: Self-pay | Admitting: Physical Therapy

## 2018-06-27 ENCOUNTER — Ambulatory Visit: Payer: Medicare Other | Attending: Internal Medicine | Admitting: Physical Therapy

## 2018-06-27 ENCOUNTER — Other Ambulatory Visit: Payer: Self-pay

## 2018-06-27 DIAGNOSIS — R293 Abnormal posture: Secondary | ICD-10-CM | POA: Diagnosis present

## 2018-06-27 DIAGNOSIS — R2689 Other abnormalities of gait and mobility: Secondary | ICD-10-CM | POA: Diagnosis present

## 2018-06-27 DIAGNOSIS — I69351 Hemiplegia and hemiparesis following cerebral infarction affecting right dominant side: Secondary | ICD-10-CM | POA: Diagnosis present

## 2018-06-27 DIAGNOSIS — R2681 Unsteadiness on feet: Secondary | ICD-10-CM | POA: Insufficient documentation

## 2018-06-27 NOTE — Therapy (Signed)
[facility] [address] [place], [place], [address] Phone: [phone]   Fax:  [phone]  Physical Therapy Evaluation  Patient Details  Name: Patrick Giles MRN: [id number] Date of Birth: Sep 28, 1932 Referring Provider (PT): Delia Heady MD; Wife requested info also sent to PCP Antony Haste; (original referral by hospitalist Leroy Sea, MD)   Encounter Date: 06/27/2018  PT End of Session - 06/27/18 1852    Visit Number  1    Number of Visits  13    Date for PT Re-Evaluation  08/08/18    Authorization Type  [employer] MEDICARE    Authorization Time Period  06/27/18 to 09/25/2018    PT Start Time  1020    PT Stop Time  1108    PT Time Calculation (min)  48 min    Activity Tolerance  Patient tolerated treatment well    Behavior During Therapy  [facility] for tasks assessed/performed       Past Medical History:  Diagnosis Date  . CAD (coronary artery disease)   . Dyslipidemia   . Hypertension   . S/P CABG (coronary artery bypass graft) 1998   LIMA to LAD, VG to PDA, VG to first diagonal    Past Surgical History:  Procedure Laterality Date  . CHOLECYSTECTOMY  1998  . CORONARY ARTERY BYPASS GRAFT  07/08/1997   LIMA to LAD, reverse SVG to PDA, reverse SVG to first diagonal (Dr. Wynetta Fines)  . TRANSTHORACIC ECHOCARDIOGRAM  11/29/2012   EF 45-50%, grade 1 diastolic dysfunction    There were no vitals filed for this visit.   Subjective Assessment - 06/27/18 1024    Subjective  My right side was effected but I can write ok. I'm a little wobbly in my walking. I'm stronger in the morning. I have a 5 month puppy at home that I need to help my wife with.     Patient is accompained by:  Family member   wife   Pertinent History  CAD status post CABG, hypertension, hyperlipidemia, 4.7 cm aortic aneurysm (which has been stable over the last 3 years per patient report)    How long can you walk comfortably?  not  sure    Patient Stated Goals  get rid of this walker; return to playing golf in time for December tournament he qualified for    Currently in Pain?  No/denies         [facility] PT Assessment - 06/27/18 1033      Assessment   Medical Diagnosis   left frontal lobe CVA    Referring Provider (PT)  Delia Heady MD; Wife requested info also sent to PCP Antony Haste; (original referral by hospitalist Leroy Sea, MD)    Onset Date/Surgical Date  06/17/18   symptoms started 9/29 and went to hospital 9/30   Prior Therapy  acute PT      Precautions   Precautions  Fall      Restrictions   Weight Bearing Restrictions  No      Balance Screen   Has the patient fallen in the past 6 months  Yes    How many times?  1   in the dark got up and tried to turn on a light and fell    Has the patient had a decrease in activity level because of a fear of falling?   No    Is the patient reluctant to leave their home because of a fear  of falling?   No      Home Environment   Living Environment  Private residence    Living Arrangements  Spouse/significant other    Available Help at Discharge  Family;Available 24 hours/day    Type of Home  House    Home Access  Stairs to enter    Entrance Stairs-Number of Steps  3    Entrance Stairs-Rails  None    Home Layout  Two level;Bed/bath upstairs    Alternate Level Stairs-Number of Steps  12    Alternate Level Stairs-Rails  Right;Left    Home Equipment  Walker - 2 wheels;Grab bars - tub/shower;Cane - single point   tub-shower     Prior Function   Level of Independence  Independent    Vocation  Retired    Leisure  plays golf 2x/week; travel;       Cognition   Overall Cognitive Status  Within Functional Limits for tasks assessed      Observation/Other Assessments   Observations  arrives pushing RW too far ahead    Focus on Therapeutic Outcomes (FOTO)   not set-up      Sensation   Light Touch  Appears Intact    Proprioception  Appears Intact    rt ankle assessed and intact     Coordination   Gross Motor Movements are Fluid and Coordinated  No    Heel Shin Test  RLE slightly off       Posture/Postural Control   Posture/Postural Control  Postural limitations    Postural Limitations  Rounded Shoulders;Forward head      ROM / Strength   AROM / PROM / Strength  AROM;Strength      AROM   Overall AROM   Within functional limits for tasks performed      Strength   Overall Strength  Deficits    Overall Strength Comments  bil Hip abduct 3+/5; otherwise 5/5      Transfers   Transfers  Sit to Stand;Stand to Sit    Sit to Stand  5: Supervision    Stand to Sit  5: Supervision    Comments  supervision for safety without use of RW; no imbalance      Ambulation/Gait   Ambulation/Gait  Yes    Ambulation/Gait Assistance  5: Supervision;4: Min guard    Ambulation/Gait Assistance Details  vc for upright posture and safe use of RW vs instructed in safe use of cane    Ambulation Distance (Feet)  60 Feet   120   Assistive device  Rolling walker;Straight cane    Gait Pattern  Step-through pattern;Decreased stance time - right;Decreased step length - left;Wide base of support    Ambulation Surface  Level;Indoor      Standardized Balance Assessment   Standardized Balance Assessment  Berg Balance Test      Berg Balance Test   Sit to Stand  Able to stand without using hands and stabilize independently    Standing Unsupported  Able to stand safely 2 minutes    Sitting with Back Unsupported but Feet Supported on Floor or Stool  Able to sit safely and securely 2 minutes    Stand to Sit  Sits safely with minimal use of hands    Transfers  Able to transfer safely, minor use of hands    Standing Unsupported with Eyes Closed  Able to stand 10 seconds safely    Standing Ubsupported with Feet Together  Able to place feet together independently  and stand 1 minute safely    From Standing, Reach Forward with Outstretched Arm  Can reach confidently  >25 cm (10")    From Standing Position, Pick up Object from Floor  Able to pick up shoe safely and easily    From Standing Position, Turn to Look Behind Over each Shoulder  Looks behind from both sides and weight shifts well    Turn 360 Degrees  Able to turn 360 degrees safely but slowly    Standing Unsupported, Alternately Place Feet on Step/Stool  Able to complete >2 steps/needs minimal assist    Standing Unsupported, One Foot in Front  Able to plae foot ahead of the other independently and hold 30 seconds    Standing on One Leg  Tries to lift leg/unable to hold 3 seconds but remains standing independently    Total Score  47    Berg comment:  46-51 = Moderate (>50%) fall risk; Pt uses cane outdoor: 47-49.6                Objective measurements completed on examination: See above findings.              PT Education - 06/27/18 1851    Education Details  results of PT eval and PT POC; role of OT and potential referral (was recommended by acute OT but did not get ordered by MD); Ok to use cane when outdoors and cane vs no device indoors    Person(s) Educated  Patient;Spouse    Methods  Explanation;Demonstration;Verbal cues    Comprehension  Verbalized understanding;Returned demonstration       PT Short Term Goals - 06/27/18 1904      PT SHORT TERM GOAL #1   Title  Patient will perform initial basic HEP independently and consistently (Target all STGs 07/27/2018--date extended due to missing 1 week of therapy prior to start due to clinic census)    Time  3    Period  Weeks    Status  New    Target Date  07/27/18      PT SHORT TERM GOAL #2   Title  Patient will improve Berg to >=51 to demonstrate progress with balance and towards lesser fall risk.     Baseline  06/27/18  47/56    Time  3    Period  Weeks    Status  New      PT SHORT TERM GOAL #3   Title  Patient will improve gait velocity with SPC (vs no device) over level, paved, outdoor surfaces by 0.2 ft/sec  compared to baseline    Time  3    Period  Weeks    Status  New      PT SHORT TERM GOAL #4   Title  Patient able to ascend/descend 3 steps with no rail with/without SPC while carrying simulated bag of groceries in one hand    Time  3    Period  Weeks    Status  New      PT SHORT TERM GOAL #5   Title  At 3 weeks assess FGA for assessing balance during gait.     Time  3    Period  Weeks    Status  New        PT Long Term Goals - 06/27/18 1912      PT LONG TERM GOAL #1   Title  Patient will be independent with updated/finalized HEP and able to verbalize plan for continued  community-based exercise/activity upon discharge from PT (TArget all LTGs 08/15/2018)    Time  6    Period  Weeks    Status  New    Target Date  08/15/18      PT LONG TERM GOAL #2   Title  Patient will improve Berg to >=54/56 to show lesser fall risk    Time  6    Period  Weeks    Status  New      PT LONG TERM GOAL #3   Title  Patient will ambulate over unlevel terrain modified independent (due to slower velocity) x 300 ft to demonstrate safety with playing golf    Time  6    Period  Weeks    Status  New      PT LONG TERM GOAL #4   Title  Patient will ascend/descend 3 steps with no rail, no device and carrying object requiring both hands independently    Time  6    Period  Weeks    Status  New      PT LONG TERM GOAL #5   Title  Patient will score >=26/30 on FGA    Time  6    Period  Weeks    Status  New             Plan - 06/27/18 1855    Clinical Impression Statement  Patient referred for OPPT due to Lt frontal CVA 06/17/18. Patient received acute level therapies and was recommended he continue with OPPT on discharge from hospital. Patient highly motivated (especially to get back to caring for his puppy and to play golf). Patient scored 47/56 on Berg Balance assessment, indicative of moderate fall risk and recommended use of cane outoors. Educated pt on use of SPC and he already has one at  home (wife used after hip surgery). Patient can benefit from PT to address the deficits listed below via the PT interventions listed below.    History and Personal Factors relevant to plan of care:  PMH- CAD status post CABG, hypertension, hyperlipidemia, 4.7 cm aortic aneurysm (which has been stable over the last 3 years per patient report); Personal factors-supportive wife, significant spontaneous recovery     Clinical Presentation  Stable    Clinical Presentation due to:  symptoms improved since discharged from hospital    Clinical Decision Making  Low    Rehab Potential  Good    Clinical Impairments Affecting Rehab Potential  none    PT Frequency  2x / week    PT Duration  6 weeks    PT Treatment/Interventions  ADLs/Self Care Home Management;Aquatic Therapy;Gait training;DME Instruction;Stair training;Functional mobility training;Therapeutic activities;Therapeutic exercise;Balance training;Neuromuscular re-education;Manual techniques;Patient/family education;Passive range of motion    PT Next Visit Plan  check gait velocity and modify goal if indicated; give HEP for RLE strengthening; bil hip abdct strength, general balance; check safe use of cane (esp outdoors, curbs, stairs    Consulted and Agree with Plan of Care  Patient;Family member/caregiver    Family Member Consulted  wife       Patient will benefit from skilled therapeutic intervention in order to improve the following deficits and impairments:  Abnormal gait, Decreased balance, Decreased mobility, Decreased knowledge of use of DME, Decreased coordination, Decreased strength, Postural dysfunction  Visit Diagnosis: Abnormal posture - Plan: PT plan of care cert/re-cert  Hemiplegia and hemiparesis following cerebral infarction affecting right dominant side (HCC) - Plan: PT plan of care cert/re-cert  Other abnormalities  of gait and mobility - Plan: PT plan of care cert/re-cert  Unsteadiness on feet - Plan: PT plan of care  cert/re-cert     Problem List Patient Active Problem List   Diagnosis Date Noted  . Acute CVA (cerebrovascular accident) (HCC) 06/19/2018  . CVA (cerebral vascular accident) (HCC) 06/18/2018  . Thoracic aortic aneurysm without rupture (HCC) 01/18/2018  . Ascending aorta dilatation (HCC) 01/18/2017  . Essential hypertension 07/24/2014  . History of DVT (deep vein thrombosis) 07/24/2014  . DVT, lower extremity (HCC) 07/23/2014  . CAD (coronary artery disease) 11/18/2013  . S/P CABG x 3 11/18/2013  . Cardiomyopathy, ischemic 11/18/2013  . Dyslipidemia 11/18/2013  . CKD (chronic kidney disease) stage 3, GFR 30-59 ml/min (HCC) 11/18/2013  . Impaired fasting glucose 11/18/2013    Zena Amos, PT 06/27/2018, 7:28 PM  [facility] [address] [place], [place], [address] Phone: [phone]   Fax:  [phone]  Name: Patrick Giles MRN: [id number] Date of Birth: 11-Dec-1932

## 2018-07-10 ENCOUNTER — Ambulatory Visit: Payer: Medicare Other | Admitting: Physical Therapy

## 2018-07-10 ENCOUNTER — Encounter: Payer: Self-pay | Admitting: Physical Therapy

## 2018-07-10 VITALS — BP 110/72

## 2018-07-10 DIAGNOSIS — R2689 Other abnormalities of gait and mobility: Secondary | ICD-10-CM

## 2018-07-10 DIAGNOSIS — I69351 Hemiplegia and hemiparesis following cerebral infarction affecting right dominant side: Secondary | ICD-10-CM

## 2018-07-10 DIAGNOSIS — R293 Abnormal posture: Secondary | ICD-10-CM | POA: Diagnosis not present

## 2018-07-10 NOTE — Patient Instructions (Signed)
  Access Code: [email or web address]  URL: [email or web address]  Date: 07/10/2018  Prepared by: Veda Canning   Exercises  Alternating Single Leg Bridge - 10 reps - 2 sets - 1x daily - 5x weekly  Single Leg Stance with Support - 10 reps - 1 sets - 2x daily - 5x weekly  Sit to Stand in Stride with staggered stance - 10 reps - 1 sets - 2x daily - 5x weekly

## 2018-07-12 ENCOUNTER — Encounter: Payer: Self-pay | Admitting: Neurology

## 2018-07-12 ENCOUNTER — Ambulatory Visit: Payer: Medicare Other | Admitting: Neurology

## 2018-07-12 VITALS — BP 140/66 | HR 72 | Ht 71.5 in | Wt 208.0 lb

## 2018-07-12 DIAGNOSIS — I6381 Other cerebral infarction due to occlusion or stenosis of small artery: Secondary | ICD-10-CM

## 2018-07-12 NOTE — Patient Instructions (Signed)
I had a long d/w patient and hs wife about his recent  Lacunar stroke,carotid stenosis risk for recurrent stroke/TIAs, personally independently reviewed imaging studies and stroke evaluation results and answered questions.Continue Plavix 75 mg alone  for secondary stroke prevention and maintain strict control of hypertension with blood pressure goal below 130/90, diabetes with hemoglobin A1c goal below 6.5% and lipids with LDL cholesterol goal below 70 mg/dL. I also advised the patient to eat a healthy diet with plenty of whole grains, cereals, fruits and vegetables, exercise regularly and maintain ideal body weight he may drive again if he wishes.Followup in the future with my nurse practitioner Shanda Bumps in 3 months or call earlier if necessary  Stroke Prevention Some medical conditions and behaviors are associated with a higher chance of having a stroke. You can help prevent a stroke by making nutrition, lifestyle, and other changes, including managing any medical conditions you may have. What nutrition changes can be made?  Eat healthy foods. You can do this by: ? Choosing foods high in fiber, such as fresh fruits and vegetables and whole grains. ? Eating at least 5 or more servings of fruits and vegetables a day. Try to fill half of your plate at each meal with fruits and vegetables. ? Choosing lean protein foods, such as lean cuts of meat, poultry without skin, fish, tofu, beans, and nuts. ? Eating low-fat dairy products. ? Avoiding foods that are high in salt (sodium). This can help lower blood pressure. ? Avoiding foods that have saturated fat, trans fat, and cholesterol. This can help prevent high cholesterol. ? Avoiding processed and premade foods.  Follow your health care provider's specific guidelines for losing weight, controlling high blood pressure (hypertension), lowering high cholesterol, and managing diabetes. These may include: ? Reducing your daily calorie intake. ? Limiting your  daily sodium intake to 1,500 milligrams (mg). ? Using only healthy fats for cooking, such as olive oil, canola oil, or sunflower oil. ? Counting your daily carbohydrate intake. What lifestyle changes can be made?  Maintain a healthy weight. Talk to your health care provider about your ideal weight.  Get at least 30 minutes of moderate physical activity at least 5 days a week. Moderate activity includes brisk walking, biking, and swimming.  Do not use any products that contain nicotine or tobacco, such as cigarettes and e-cigarettes. If you need help quitting, ask your health care provider. It may also be helpful to avoid exposure to secondhand smoke.  Limit alcohol intake to no more than 1 drink a day for nonpregnant women and 2 drinks a day for men. One drink equals 12 oz of beer, 5 oz of wine, or 1 oz of hard liquor.  Stop any illegal drug use.  Avoid taking birth control pills. Talk to your health care provider about the risks of taking birth control pills if: ? You are over 65 years old. ? You smoke. ? You get migraines. ? You have ever had a blood clot. What other changes can be made?  Manage your cholesterol levels. ? Eating a healthy diet is important for preventing high cholesterol. If cholesterol cannot be managed through diet alone, you may also need to take medicines. ? Take any prescribed medicines to control your cholesterol as told by your health care provider.  Manage your diabetes. ? Eating a healthy diet and exercising regularly are important parts of managing your blood sugar. If your blood sugar cannot be managed through diet and exercise, you may need to take  medicines. ? Take any prescribed medicines to control your diabetes as told by your health care provider.  Control your hypertension. ? To reduce your risk of stroke, try to keep your blood pressure below 130/80. ? Eating a healthy diet and exercising regularly are an important part of controlling your blood  pressure. If your blood pressure cannot be managed through diet and exercise, you may need to take medicines. ? Take any prescribed medicines to control hypertension as told by your health care provider. ? Ask your health care provider if you should monitor your blood pressure at home. ? Have your blood pressure checked every year, even if your blood pressure is normal. Blood pressure increases with age and some medical conditions.  Get evaluated for sleep disorders (sleep apnea). Talk to your health care provider about getting a sleep evaluation if you snore a lot or have excessive sleepiness.  Take over-the-counter and prescription medicines only as told by your health care provider. Aspirin or blood thinners (antiplatelets or anticoagulants) may be recommended to reduce your risk of forming blood clots that can lead to stroke.  Make sure that any other medical conditions you have, such as atrial fibrillation or atherosclerosis, are managed. What are the warning signs of a stroke? The warning signs of a stroke can be easily remembered as BEFAST.  B is for balance. Signs include: ? Dizziness. ? Loss of balance or coordination. ? Sudden trouble walking.  E is for eyes. Signs include: ? A sudden change in vision. ? Trouble seeing.  F is for face. Signs include: ? Sudden weakness or numbness of the face. ? The face or eyelid drooping to one side.  A is for arms. Signs include: ? Sudden weakness or numbness of the arm, usually on one side of the body.  S is for speech. Signs include: ? Trouble speaking (aphasia). ? Trouble understanding.  T is for time. ? These symptoms may represent a serious problem that is an emergency. Do not wait to see if the symptoms will go away. Get medical help right away. Call your local emergency services (911 in the [place] Do not drive yourself to the hospital.  Other signs of stroke may include: ? A sudden, severe headache with no known  cause. ? Nausea or vomiting. ? Seizure.  Where to find more information: For more information, visit:  American Stroke Association: www.strokeassociation.org  National Stroke Association: www.stroke.org  Summary  You can prevent a stroke by eating healthy, exercising, not smoking, limiting alcohol intake, and managing any medical conditions you may have.  Do not use any products that contain nicotine or tobacco, such as cigarettes and e-cigarettes. If you need help quitting, ask your health care provider. It may also be helpful to avoid exposure to secondhand smoke.  Remember BEFAST for warning signs of stroke. Get help right away if you or a loved one has any of these signs. This information is not intended to replace advice given to you by your health care provider. Make sure you discuss any questions you have with your health care provider. Document Released: 10/13/2004 Document Revised: 10/11/2016 Document Reviewed: 10/11/2016 Elsevier Interactive Patient Education  [employer].

## 2018-07-12 NOTE — Progress Notes (Signed)
[facility] [address] [place]. [place] [address] [phone]       OFFICE FOLLOW-UP NOTE  Mr. ISSIAH HUFFAKER Date of Birth:  [birth year] Medical Record Number:  [id number]   HPI: Mr. Genther is a 84 year old Caucasian male seen today for initial office follow-up visit following hospital admission for stroke in September 2019.  History is obtained from the patient and review of electronic medical records.  I personally reviewed imaging films.HPI:( Dr Amada Jupiter 06/18/18 ) ANAV LAMMERT is a 84 y.o. male with a history of CAD, hypertension, hyperlipidemia who presents with right-sided weakness that was present on awakening.  He states that he went to bed feeling normal, when he woke up he noticed that he was having problems with his right side.  Due to this he activated 911 and EMS was concerned that he might have some neglect and therefore activated a code stroke for the 24-hour window.He does take BP meds, but does nto currently take any blood thinners. He was on elliquis previously due to DVT, but is not currently.  LKW: 10 PM on 06/17/18 tpa given?: no, out of window MRI scan of the brain showed a 1 cm area of acute infarction involving left posterior frontal periventricular and subcortical white matter near the vertex.  CT angiogram showed calcified plaque at both carotid bifurcations but without significant stenosis.  There was 50 to 70% supraclinoid ICA stenosis bilaterally.  Transthoracic echo showed normal ejection fraction was suboptimal due to poor acoustic windows.  LDL cholesterol was 68 mg percent and hemoglobin A1c was 5.9.  Patient was started on aspirin and Plavix for 3 weeks and has since stopped aspirin.  He is doing well.  He feels right-sided weakness is much improved.  He is currently is getting outpatient physical and occupational therapy and doing well.  He recently saw vascular surgeon in [place] who also recommended conservative medical therapy.  He states  his blood pressure is well controlled at home though today it is elevated in office at 140/66.  He is tolerating Plavix well without bleeding or bruising.  He is also tolerating Lipitor without muscle aches and pains.  He has no new complaints today.    ROS:   14 system review of systems is positive for weakness, bruising and all other systems negative  PMH:  Past Medical History:  Diagnosis Date  . CAD (coronary artery disease)   . Dyslipidemia   . Hypertension   . S/P CABG (coronary artery bypass graft) 1998   LIMA to LAD, VG to PDA, VG to first diagonal  . Stroke [facility])     Social History:  Social History   Socioeconomic History  . Marital status: Married    Spouse name: Not on file  . Number of children: 5  . Years of education: 71  . Highest education level: Not on file  Occupational History  . Occupation: Retired  Engineer, production  . Financial resource strain: Not on file  . Food insecurity:    Worry: Not on file    Inability: Not on file  . Transportation needs:    Medical: Not on file    Non-medical: Not on file  Tobacco Use  . Smoking status: Former Smoker    Types: Cigars    Last attempt to quit: 09/19/1988    Years since quitting: 29.8  . Smokeless tobacco: Never Used  Substance and Sexual Activity  . Alcohol use: No    Alcohol/week: 0.0 standard drinks  .  Drug use: No  . Sexual activity: Not on file  Lifestyle  . Physical activity:    Days per week: Not on file    Minutes per session: Not on file  . Stress: Not on file  Relationships  . Social connections:    Talks on phone: Not on file    Gets together: Not on file    Attends religious service: Not on file    Active member of club or organization: Not on file    Attends meetings of clubs or organizations: Not on file    Relationship status: Not on file  . Intimate partner violence:    Fear of current or ex partner: Not on file    Emotionally abused: Not on file    Physically abused: Not on file     Forced sexual activity: Not on file  Other Topics Concern  . Not on file  Social History Narrative  . Not on file    Medications:   Current Outpatient Medications on File Prior to Visit  Medication Sig Dispense Refill  . amLODipine-benazepril (LOTREL) 5-20 MG capsule Take 1 capsule by mouth daily.    Marland Kitchen atorvastatin (LIPITOR) 40 MG tablet TAKE 1 TABLET BY MOUTH EVERY DAY AT 6PM (Patient taking differently: Take 40 mg by mouth daily at 6 PM. ) 90 tablet 3  . clopidogrel (PLAVIX) 75 MG tablet Take 1 tablet (75 mg total) by mouth daily. 30 tablet 3  . clopidogrel (PLAVIX) 75 MG tablet Take by mouth.    Marland Kitchen GARLIC PO Take 1,610 mg by mouth daily.     . metoprolol succinate (TOPROL-XL) 25 MG 24 hr tablet TAKE 1/2 A TABLET BY MOUTH EVERY DAY (Patient taking differently: Take 12.5 mg by mouth daily. ) 15 tablet 6  . Multiple Vitamins-Minerals (ALIVE MENS ENERGY PO) Take 1 tablet by mouth daily.     . Omega-3 Fatty Acids (FISH OIL) 1200 MG CAPS Take 2 capsules by mouth daily.    . pantoprazole (PROTONIX) 40 MG tablet Take 1 tablet (40 mg total) by mouth daily. 30 tablet 2  . pantoprazole (PROTONIX) 40 MG tablet Take by mouth.     No current facility-administered medications on file prior to visit.     Allergies:  No Known Allergies  Physical Exam General: well developed, well nourished elderly male, seated, in no evident distress Head: head normocephalic and atraumatic.  Neck: supple with no carotid or supraclavicular bruits Cardiovascular: regular rate and rhythm, no murmurs Musculoskeletal: no deformity Skin:  no rash/petichiae Vascular:  Normal pulses all extremities Vitals:   07/12/18 1142  BP: 140/66  Pulse: 72   Neurologic Exam Mental Status: Awake and fully alert. Oriented to place and time. Recent and remote memory intact. Attention span, concentration and fund of knowledge appropriate. Mood and affect appropriate.  Cranial Nerves: Fundoscopic exam reveals sharp disc margins.  Pupils equal, briskly reactive to light. Extraocular movements full without nystagmus. Visual fields full to confrontation. Hearing diminished bilaterally facial sensation intact. Face, tongue, palate moves normally and symmetrically.  Motor: Normal bulk and tone. Normal strength in all tested extremity muscles.  Diminished fine finger movements on the right.  Orbits left to right upper extremity. Sensory.: intact to touch ,pinprick .position and vibratory sensation.  Coordination: Rapid alternating movements normal in all extremities. Finger-to-nose and heel-to-shin performed accurately bilaterally. Gait and Station: Arises from chair without difficulty. Stance is normal. Gait demonstrates normal stride length and balance . Able to heel, toe and tandem  walk with mild difficulty.  Reflexes: 1+ and symmetric. Toes downgoing.   NIHSS 0 Modified Rankin  1   ASSESSMENT: 84 year old male with left frontal subcortical infarct in September 2019 possibly lacunar versus arterial embolic from cavernous carotid stenosis.  Patient is doing well and has made good recovery.  Vascular risk factors of hypertension, hyperlipidemia and age    PLAN: I had a long d/w patient and hs wife about his recent  Lacunar stroke,carotid stenosis risk for recurrent stroke/TIAs, personally independently reviewed imaging studies and stroke evaluation results and answered questions.Continue Plavix 75 mg alone  for secondary stroke prevention and maintain strict control of hypertension with blood pressure goal below 130/90, diabetes with hemoglobin A1c goal below 6.5% and lipids with LDL cholesterol goal below 70 mg/dL. I also advised the patient to eat a healthy diet with plenty of whole grains, cereals, fruits and vegetables, exercise regularly and maintain ideal body weight he may drive again if he wishes.Followup in the future with my nurse practitioner Shanda Bumps in 3 months or call earlier if necessary Greater than 50% of time  during this 25 minute visit was spent on counseling,explanation of diagnosis, planning of further management, discussion with patient and family and coordination of care Delia Heady, MD  [facility] [address] [place], [place] [address]  Phone [phone] Fax [phone] Note: This document was prepared with digital dictation and possible smart phrase technology. Any transcriptional errors that result from this process are unintentional

## 2018-07-12 NOTE — Therapy (Signed)
[facility] [address] [place], [place], [address] Phone: [phone]   Fax:  [phone]  Physical Therapy Treatment  Patient Details  Name: Patrick Giles MRN: [id number] Date of Birth: May 24, 1933 Referring Provider (PT): Delia Heady MD; Wife requested info also sent to PCP Antony Haste; (original referral by hospitalist Leroy Sea, MD)   Encounter Date: 07/10/2018   07/10/18 1246  PT Visits / Re-Eval  Visit Number 2  Number of Visits 13  Date for PT Re-Evaluation 08/08/18  Authorization  Authorization Type [employer]  Authorization Time Period 06/27/18 to 09/25/2018  PT Time Calculation  PT Start Time 1125  PT Stop Time 1215  PT Time Calculation (min) 50 min  PT - End of Session  Activity Tolerance Patient tolerated treatment well  Behavior During Therapy [facility] for tasks assessed/performed      07/10/18 1127  Symptoms/Limitations  Subjective Done OK with the cane--no falls. Most of the time using the cane. May have overdone the walking initially as made my back sore.   Patient is accompained by: Family member (wife)  Pertinent History CAD status post CABG, hypertension, hyperlipidemia, 4.7 cm aortic aneurysm (which has been stable over the last 3 years per patient report)  How long can you walk comfortably? not sure  Patient Stated Goals get rid of this walker; return to playing golf in time for December tournament he qualified for  Pain Assessment  Currently in Pain? No/denies    Past Medical History:  Diagnosis Date  . CAD (coronary artery disease)   . Dyslipidemia   . Hypertension   . S/P CABG (coronary artery bypass graft) 1998   LIMA to LAD, VG to PDA, VG to first diagonal    Past Surgical History:  Procedure Laterality Date  . CHOLECYSTECTOMY  1998  . CORONARY ARTERY BYPASS GRAFT  07/08/1997   LIMA to LAD, reverse SVG to PDA, reverse SVG to first diagonal (Dr. Wynetta Fines)  . TRANSTHORACIC ECHOCARDIOGRAM  11/29/2012   EF 45-50%, grade 1 diastolic dysfunction    Vitals:   07/10/18 1133  BP: 110/72        07/10/18 1139  Transfers  Transfers Sit to Stand;Stand to Sit  Sit to Stand 5: Supervision;Without upper extremity assist;From chair/3-in-1  Sit to Stand Details (indicate cue type and reason) vc for technique to not push back of legs against chair; also instructed as an exercise (see HEP)  Stand to Sit 5: Supervision;Without upper extremity assist;To chair/3-in-1  Stand to Sit Details vc for SLOW descent  Number of Reps Other reps (comment) (5)  Ambulation/Gait  Ambulation/Gait Yes  Ambulation/Gait Assistance 5: Supervision;4: Min guard  Ambulation/Gait Assistance Details supervision with cane with cues for placement of cane; pt prefers cane in his right hand (involved side) however does not seem to impact his gait and is actually good use/exercise for RUE  Ambulation Distance (Feet) 200 Feet (75)  Assistive device Straight cane;None  Gait Pattern Step-through pattern;Decreased stance time - right;Decreased step length - left;Wide base of support;Decreased weight shift to right;Lateral trunk lean to left  Ambulation Surface Level;Indoor  Gait velocity 45ft/16.40 sec=2.07 (with cane; 32.8 ft/14.94 sec=2.20 ft/sec no device)  Exercises  Exercises Knee/Hip  Knee/Hip Exercises: Standing  SLS using single UE support (same hand as stance leg) x10 sec then move to no UE support for as long as he could; x 10 each leg with pt ultimately achieving 10 seconds without UE support at least  once on each leg  Knee/Hip Exercises: Seated  Sit to Sand 10 reps;without UE support (5x normal stance; 5x stagger stance, right foot back)  Knee/Hip Exercises: Supine  Bridges Strengthening;Both;5 reps (5 sec hold)  Other Supine Knee/Hip Exercises bridge with marching each leg x 5 reps  Other Supine Knee/Hip Exercises bridge with knee extension x 10  (alternating legs) and then lower; 4 reps       07/10/18 1244  PT Education  Education Details initiated HEP--pt required increased time instructing him in exercises due to ?combination of decr hearing and decr cognition; multiple repetitions of each exercise for education; educated both pt/wife that she needs to supervise him doing exercises to assure he was doing correctly and both agreed  Person(s) Educated Patient;Spouse  Methods Explanation;Demonstration;Tactile cues;Verbal cues;Handout  Comprehension Verbalized understanding;Returned demonstration;Verbal cues required;Tactile cues required;Need further instruction                          PT Short Term Goals - 07/10/18 1700      PT SHORT TERM GOAL #1   Title  Patient will perform initial basic HEP independently and consistently (Target all STGs 07/27/2018--date extended due to missing 1 week of therapy prior to start due to clinic census)    Time  3    Period  Weeks    Status  New      PT SHORT TERM GOAL #2   Title  Patient will improve Berg to >=51 to demonstrate progress with balance and towards lesser fall risk.     Baseline  06/27/18  47/56    Time  3    Period  Weeks    Status  New      PT SHORT TERM GOAL #3   Title  Patient will improve gait velocity with SPC (vs no device) over level, paved, outdoor surfaces by 0.2 ft/sec compared to baseline    Baseline  07/10/18 2.07 with cane; 2.2 no device    Time  3    Period  Weeks    Status  New      PT SHORT TERM GOAL #4   Title  Patient able to ascend/descend 3 steps with no rail with/without SPC while carrying simulated bag of groceries in one hand    Time  3    Period  Weeks    Status  New      PT SHORT TERM GOAL #5   Title  At 3 weeks assess FGA for assessing balance during gait.     Time  3    Period  Weeks    Status  New        PT Long Term Goals - 06/27/18 1912      PT LONG TERM GOAL #1   Title  Patient will be independent with  updated/finalized HEP and able to verbalize plan for continued community-based exercise/activity upon discharge from PT (TArget all LTGs 08/15/2018)    Time  6    Period  Weeks    Status  New    Target Date  08/15/18      PT LONG TERM GOAL #2   Title  Patient will improve Berg to >=54/56 to show lesser fall risk    Time  6    Period  Weeks    Status  New      PT LONG TERM GOAL #3   Title  Patient will ambulate over unlevel terrain modified independent (due  to slower velocity) x 300 ft to demonstrate safety with playing golf    Time  6    Period  Weeks    Status  New      PT LONG TERM GOAL #4   Title  Patient will ascend/descend 3 steps with no rail, no device and carrying object requiring both hands independently    Time  6    Period  Weeks    Status  New      PT LONG TERM GOAL #5   Title  Patient will score >=26/30 on FGA    Time  6    Period  Weeks    Status  New          07/10/18 1700  Plan  Clinical Impression Statement Initiated HEP for strengthening and balance. Patient required min tactile cues and mod-max verbal cues to correctly perform exercises. Required increased time and multiple reps to assure proper technique. Wife present throughout session and very attentive. Patient can benefit from PT to achieve LTGs  Pt will benefit from skilled therapeutic intervention in order to improve on the following deficits Abnormal gait;Decreased balance;Decreased mobility;Decreased knowledge of use of DME;Decreased coordination;Decreased strength;Postural dysfunction  Rehab Potential Good  Clinical Impairments Affecting Rehab Potential none  PT Frequency 2x / week  PT Duration 6 weeks  PT Treatment/Interventions ADLs/Self Care Home Management;Aquatic Therapy;Gait training;DME Instruction;Stair training;Functional mobility training;Therapeutic activities;Therapeutic exercise;Balance training;Neuromuscular re-education;Manual techniques;Patient/family education;Passive range of  motion  PT Next Visit Plan review HEP from 10/22 as he struggled to understand; cont RLE strengthening; bil hip abdct strength, general balance; check safe use of cane (esp outdoors, curbs, stairs  Consulted and Agree with Plan of Care Patient;Family member/caregiver  Family Member Consulted wife         Patient will benefit from skilled therapeutic intervention in order to improve the following deficits and impairments:  Abnormal gait, Decreased balance, Decreased mobility, Decreased knowledge of use of DME, Decreased coordination, Decreased strength, Postural dysfunction  Visit Diagnosis: Hemiplegia and hemiparesis following cerebral infarction affecting right dominant side (HCC)  Other abnormalities of gait and mobility     Problem List Patient Active Problem List   Diagnosis Date Noted  . Acute CVA (cerebrovascular accident) (HCC) 06/19/2018  . CVA (cerebral vascular accident) (HCC) 06/18/2018  . Thoracic aortic aneurysm without rupture (HCC) 01/18/2018  . Ascending aorta dilatation (HCC) 01/18/2017  . Essential hypertension 07/24/2014  . History of DVT (deep vein thrombosis) 07/24/2014  . DVT, lower extremity (HCC) 07/23/2014  . CAD (coronary artery disease) 11/18/2013  . S/P CABG x 3 11/18/2013  . Cardiomyopathy, ischemic 11/18/2013  . Dyslipidemia 11/18/2013  . CKD (chronic kidney disease) stage 3, GFR 30-59 ml/min (HCC) 11/18/2013  . Impaired fasting glucose 11/18/2013    Scherrie November Xadrian Craighead. PT 07/12/2018, 6:35 AM  [facility] [address] [place], [place], [address] Phone: [phone]   Fax:  [phone]  Name: MAXAMILLION BANAS MRN: [id number] Date of Birth: November 29, 1932

## 2018-07-13 ENCOUNTER — Ambulatory Visit: Payer: Medicare Other

## 2018-07-13 DIAGNOSIS — R2689 Other abnormalities of gait and mobility: Secondary | ICD-10-CM

## 2018-07-13 DIAGNOSIS — R293 Abnormal posture: Secondary | ICD-10-CM

## 2018-07-13 DIAGNOSIS — R2681 Unsteadiness on feet: Secondary | ICD-10-CM

## 2018-07-13 DIAGNOSIS — I69351 Hemiplegia and hemiparesis following cerebral infarction affecting right dominant side: Secondary | ICD-10-CM

## 2018-07-13 NOTE — Patient Instructions (Signed)
Access Code: [email or web address]  URL: [email or web address]  Date: 07/13/2018  Prepared by: Zerita Boers   Exercises  Single Leg Stance with Support - 10 reps - 1 sets - 2x daily - 5x weekly  Supine Bridge - 10 reps - 3 sets - 1x daily - 3x weekly  Sit to stand in staggered stance - 10 reps - 1 sets - 2x daily - 5x weekly  Clamshell - 10 reps - 3 sets - 1x daily - 3x weekly

## 2018-07-13 NOTE — Therapy (Signed)
[facility] [address] [place], [place], [address] Phone: [phone]   Fax:  [phone]  Physical Therapy Treatment  Patient Details  Name: Patrick Giles MRN: [id number] Date of Birth: 1932-12-29 Referring Provider (PT): Delia Heady MD; Wife requested info also sent to PCP Antony Haste; (original referral by hospitalist Leroy Sea, MD)   Encounter Date: 07/13/2018  PT End of Session - 07/13/18 1113    Visit Number  3    Number of Visits  13    Date for PT Re-Evaluation  08/08/18    Authorization Type  [employer]IC[employer]    Authorization Time Period  06/27/18 to 09/25/2018    PT Start Time  1023    PT Stop Time  1103    PT Time Calculation (min)  40 min    Equipment Utilized During Treatment  --   min guard to S prn   Activity Tolerance  Patient tolerated treatment well    Behavior During Therapy  [facility] for tasks assessed/performed       Past Medical History:  Diagnosis Date  . CAD (coronary artery disease)   . Dyslipidemia   . Hypertension   . S/P CABG (coronary artery bypass graft) 1998   LIMA to LAD, VG to PDA, VG to first diagonal  . Stroke [facility])     Past Surgical History:  Procedure Laterality Date  . CHOLECYSTECTOMY  1998  . CORONARY ARTERY BYPASS GRAFT  07/08/1997   LIMA to LAD, reverse SVG to PDA, reverse SVG to first diagonal (Dr. Wynetta Fines)  . TRANSTHORACIC ECHOCARDIOGRAM  11/29/2012   EF 45-50%, grade 1 diastolic dysfunction    There were no vitals filed for this visit.  Subjective Assessment - 07/13/18 1026    Subjective  Pt denied falls or changes since last visit. Pt amb. back to gym with SPC. Pt reported he's been performing HEP.     Patient is accompained by:  Family member   Gloria-wife   Pertinent History  CAD status post CABG, hypertension, hyperlipidemia, 4.7 cm aortic aneurysm (which has been stable over the last 3 years per patient report)    Patient Stated  Goals  get rid of this walker; return to playing golf in time for December tournament he qualified for    Currently in Pain?  No/denies           Therex and Neuro re-ed: Access Code: [email or web address]  URL: [email or web address]  Date: 07/13/2018  Prepared by: Zerita Boers   Exercises  Single Leg Stance with Support - 10 reps - 1 sets - 2x daily - 5x weekly  (NMR) Supine Bridge - 10 reps - 3 sets - 1x daily - 3x weekly  Sit to stand in staggered stance - 10 reps - 1 sets - 2x daily - 5x weekly  Clamshell - 10 reps - 3 sets - 1x daily - 3x weekly   Pt required tactile and verbal cues for technique, along with demo. No pain reported during session.                  Balance Exercises - 07/13/18 1053      Balance Exercises: Standing   Standing Eyes Opened  Narrow base of support (BOS);Head turns;Solid surface;10 secs;30 secs;2 reps   head turns/nods 2x10reps   Standing Eyes Closed  Narrow base of support (BOS);Solid surface;3 reps;10 secs    Tandem Stance  Eyes open;Intermittent upper extremity support;3 reps;20 secs  3 reps/LE   SLS  Eyes open;Upper extremity support 1;3 reps;10 secs    Other Standing Exercises  Performed with mat behind pt and chair in front of pt with intermittent UE support and min guard  to S for safety. Cues and demo for technique. SLS is a part of HEP.         PT Education - 07/13/18 1108    Education Details  PT reviewed HEP and modified and progressed as tolerated.     Person(s) Educated  Patient;Spouse    Methods  Explanation;Demonstration;Tactile cues;Verbal cues;Handout    Comprehension  Returned demonstration;Verbalized understanding;Need further instruction       PT Short Term Goals - 07/10/18 1700      PT SHORT TERM GOAL #1   Title  Patient will perform initial basic HEP independently and consistently (Target all STGs 07/27/2018--date extended due to missing 1 week of therapy prior to start due to clinic census)     Time  3    Period  Weeks    Status  New      PT SHORT TERM GOAL #2   Title  Patient will improve Berg to >=51 to demonstrate progress with balance and towards lesser fall risk.     Baseline  06/27/18  47/56    Time  3    Period  Weeks    Status  New      PT SHORT TERM GOAL #3   Title  Patient will improve gait velocity with SPC (vs no device) over level, paved, outdoor surfaces by 0.2 ft/sec compared to baseline    Baseline  07/10/18 2.07 with cane; 2.2 no device    Time  3    Period  Weeks    Status  New      PT SHORT TERM GOAL #4   Title  Patient able to ascend/descend 3 steps with no rail with/without SPC while carrying simulated bag of groceries in one hand    Time  3    Period  Weeks    Status  New      PT SHORT TERM GOAL #5   Title  At 3 weeks assess FGA for assessing balance during gait.     Time  3    Period  Weeks    Status  New        PT Long Term Goals - 06/27/18 1912      PT LONG TERM GOAL #1   Title  Patient will be independent with updated/finalized HEP and able to verbalize plan for continued community-based exercise/activity upon discharge from PT (TArget all LTGs 08/15/2018)    Time  6    Period  Weeks    Status  New    Target Date  08/15/18      PT LONG TERM GOAL #2   Title  Patient will improve Berg to >=54/56 to show lesser fall risk    Time  6    Period  Weeks    Status  New      PT LONG TERM GOAL #3   Title  Patient will ambulate over unlevel terrain modified independent (due to slower velocity) x 300 ft to demonstrate safety with playing golf    Time  6    Period  Weeks    Status  New      PT LONG TERM GOAL #4   Title  Patient will ascend/descend 3 steps with no rail, no device and carrying object requiring  both hands independently    Time  6    Period  Weeks    Status  New      PT LONG TERM GOAL #5   Title  Patient will score >=26/30 on FGA    Time  6    Period  Weeks    Status  New            Plan - 07/13/18 1113     Clinical Impression Statement  Today's skilled session focused on reviewing HEP due to pt requiring mod-max VCs last session. Extensive time spent on technique, as pt again required mod-max VCs and min tactile cues. PT modified and added to HEP as indicated. Pt also performed standing balance activities with min guard to S for safety and intermittent UE. Pt experienced incr. postural sway during activities with narrowed BOS and during activities which required incr. vestibular input.     Rehab Potential  Good    Clinical Impairments Affecting Rehab Potential  none    PT Frequency  2x / week    PT Duration  6 weeks    PT Treatment/Interventions  ADLs/Self Care Home Management;Aquatic Therapy;Gait training;DME Instruction;Stair training;Functional mobility training;Therapeutic activities;Therapeutic exercise;Balance training;Neuromuscular re-education;Manual techniques;Patient/family education;Passive range of motion    PT Next Visit Plan   general balance; check safe use of cane (esp outdoors, curbs, stairs    Consulted and Agree with Plan of Care  Patient;Family member/caregiver    Family Member Consulted  wife       Patient will benefit from skilled therapeutic intervention in order to improve the following deficits and impairments:  Abnormal gait, Decreased balance, Decreased mobility, Decreased knowledge of use of DME, Decreased coordination, Decreased strength, Postural dysfunction  Visit Diagnosis: Hemiplegia and hemiparesis following cerebral infarction affecting right dominant side (HCC)  Other abnormalities of gait and mobility  Unsteadiness on feet  Abnormal posture     Problem List Patient Active Problem List   Diagnosis Date Noted  . Acute CVA (cerebrovascular accident) (HCC) 06/19/2018  . CVA (cerebral vascular accident) (HCC) 06/18/2018  . Thoracic aortic aneurysm without rupture (HCC) 01/18/2018  . Ascending aorta dilatation (HCC) 01/18/2017  . Essential hypertension  07/24/2014  . History of DVT (deep vein thrombosis) 07/24/2014  . DVT, lower extremity (HCC) 07/23/2014  . CAD (coronary artery disease) 11/18/2013  . S/P CABG x 3 11/18/2013  . Cardiomyopathy, ischemic 11/18/2013  . Dyslipidemia 11/18/2013  . CKD (chronic kidney disease) stage 3, GFR 30-59 ml/min (HCC) 11/18/2013  . Impaired fasting glucose 11/18/2013    Willard Farquharson L 07/13/2018, 11:16 AM  [facility] [address] [place], [place], [address] Phone: [phone]   Fax:  [phone]  Name: QUINTARIUS FERNS MRN: [id number] Date of Birth: [birth year]  Zerita Boers, PT,DPT 07/13/18 11:17 AM Phone: [phone] Fax: [phone]

## 2018-07-16 ENCOUNTER — Ambulatory Visit: Payer: Medicare Other | Admitting: Physical Therapy

## 2018-07-16 ENCOUNTER — Encounter: Payer: Self-pay | Admitting: Physical Therapy

## 2018-07-16 DIAGNOSIS — R293 Abnormal posture: Secondary | ICD-10-CM | POA: Diagnosis not present

## 2018-07-16 DIAGNOSIS — R2689 Other abnormalities of gait and mobility: Secondary | ICD-10-CM

## 2018-07-16 DIAGNOSIS — R2681 Unsteadiness on feet: Secondary | ICD-10-CM

## 2018-07-16 DIAGNOSIS — I69351 Hemiplegia and hemiparesis following cerebral infarction affecting right dominant side: Secondary | ICD-10-CM

## 2018-07-17 NOTE — Therapy (Signed)
[facility] [address] [place], [place], [address] Phone: [phone]   Fax:  [phone]  Physical Therapy Treatment  Patient Details  Name: Patrick Giles MRN: [id number] Date of Birth: 19-Jun-1933 Referring Provider (PT): Delia Heady MD; Wife requested info also sent to PCP Antony Haste; (original referral by hospitalist Leroy Sea, MD)   Encounter Date: 07/16/2018  PT End of Session - 07/16/18 1700    Visit Number  4    Number of Visits  13    Date for PT Re-Evaluation  08/08/18    Authorization Type  [employer]    Authorization Time Period  06/27/18 to 09/25/2018    PT Start Time  1452    PT Stop Time  1533    PT Time Calculation (min)  41 min    Equipment Utilized During Treatment  Gait belt   min guard to S prn   Activity Tolerance  Patient tolerated treatment well    Behavior During Therapy  [facility] for tasks assessed/performed       Past Medical History:  Diagnosis Date  . CAD (coronary artery disease)   . Dyslipidemia   . Hypertension   . S/P CABG (coronary artery bypass graft) 1998   LIMA to LAD, VG to PDA, VG to first diagonal  . Stroke [facility])     Past Surgical History:  Procedure Laterality Date  . CHOLECYSTECTOMY  1998  . CORONARY ARTERY BYPASS GRAFT  07/08/1997   LIMA to LAD, reverse SVG to PDA, reverse SVG to first diagonal (Dr. Wynetta Fines)  . TRANSTHORACIC ECHOCARDIOGRAM  11/29/2012   EF 45-50%, grade 1 diastolic dysfunction    There were no vitals filed for this visit.  Subjective Assessment - 07/16/18 1456    Subjective  denies falls or close calls. Using cane mostly when leaves the house. Plans to play golf tomorrow--has not tried to swing a golf club yet    Patient is accompained by:  Family member   Gloria-wife   Pertinent History  CAD status post CABG, hypertension, hyperlipidemia, 4.7 cm aortic aneurysm (which has been stable over the last 3 years per  patient report)    Patient Stated Goals  get rid of this walker; return to playing golf in time for December tournament he qualified for    Currently in Pain?  No/denies        Treatment-  Gait training- indoors/outdoors no device; unlevel (slopes, grass, curbs); head turns; change in velocity. Pt with occasional drift when turning head, but no LOB and recovers without external support. Ambulated ~300 ft outdoors, plus 300 ft indoors.   Therapeutic Activities- Standing in grass with gentle swing of golf club progressing to "pitch" shots with whiffle golf ball progressing to full swing (driver) with no imbalance. Slight hesitation with sense of imbalance when bending down to simulate placing ball on the tee, however pt did not lose his balance.   Neuro re-ed--Balance activities on balance board to elicit ankle and hip strategies to maintain or recover his balance. Pt with significant difficulty keeping his balance when his COG drifts posteriorly (has to catch himself using UEs on // bars or PT prevents posterior fall by catching/supporting him). Tandem walking, slow marching for SLS, and backwards walking for balance and hip strengthening.                        PT Education - 07/17/18 6962  Education Details  doing well overall; anticipate will d/c from PT sooner than originally expected; will begin to assess STGs next visit (ahead of schedule)    Person(s) Educated  Patient;Spouse    Methods  Explanation    Comprehension  Verbalized understanding       PT Short Term Goals - 07/10/18 1700      PT SHORT TERM GOAL #1   Title  Patient will perform initial basic HEP independently and consistently (Target all STGs 07/27/2018--date extended due to missing 1 week of therapy prior to start due to clinic census)    Time  3    Period  Weeks    Status  New      PT SHORT TERM GOAL #2   Title  Patient will improve Berg to >=51 to demonstrate progress with balance and towards  lesser fall risk.     Baseline  06/27/18  47/56    Time  3    Period  Weeks    Status  New      PT SHORT TERM GOAL #3   Title  Patient will improve gait velocity with SPC (vs no device) over level, paved, outdoor surfaces by 0.2 ft/sec compared to baseline    Baseline  07/10/18 2.07 with cane; 2.2 no device    Time  3    Period  Weeks    Status  New      PT SHORT TERM GOAL #4   Title  Patient able to ascend/descend 3 steps with no rail with/without SPC while carrying simulated bag of groceries in one hand    Time  3    Period  Weeks    Status  New      PT SHORT TERM GOAL #5   Title  At 3 weeks assess FGA for assessing balance during gait.     Time  3    Period  Weeks    Status  New        PT Long Term Goals - 06/27/18 1912      PT LONG TERM GOAL #1   Title  Patient will be independent with updated/finalized HEP and able to verbalize plan for continued community-based exercise/activity upon discharge from PT (TArget all LTGs 08/15/2018)    Time  6    Period  Weeks    Status  New    Target Date  08/15/18      PT LONG TERM GOAL #2   Title  Patient will improve Berg to >=54/56 to show lesser fall risk    Time  6    Period  Weeks    Status  New      PT LONG TERM GOAL #3   Title  Patient will ambulate over unlevel terrain modified independent (due to slower velocity) x 300 ft to demonstrate safety with playing golf    Time  6    Period  Weeks    Status  New      PT LONG TERM GOAL #4   Title  Patient will ascend/descend 3 steps with no rail, no device and carrying object requiring both hands independently    Time  6    Period  Weeks    Status  New      PT LONG TERM GOAL #5   Title  Patient will score >=26/30 on FGA    Time  6    Period  Weeks    Status  New  Plan - 07/16/18 1700    Clinical Impression Statement  Session focused on balance training, strengthening, and gait training. Incorporated swinging golf club (and then actually hitting  "whiffle" golf balls with pt able to maintain balance even on unlevel grass surface. Patient challenged with activities moving his hips or arms outside his BOS, however mostly able to recover without assistance (occasional assist with LOB posterior). Patient progressing well and will plan to check STG's early at next visit.     Rehab Potential  Good    Clinical Impairments Affecting Rehab Potential  none    PT Frequency  2x / week    PT Duration  6 weeks    PT Treatment/Interventions  ADLs/Self Care Home Management;Aquatic Therapy;Gait training;DME Instruction;Stair training;Functional mobility training;Therapeutic activities;Therapeutic exercise;Balance training;Neuromuscular re-education;Manual techniques;Patient/family education;Passive range of motion    PT Next Visit Plan   chk STGs (yes, early--pt doing very well); higher level balance eliciting hip strategy--especially for LOB posteriorly; possibly can recommend d/c cane even in  community    Consulted and Agree with Plan of Care  Patient;Family member/caregiver    Family Member Consulted  wife       Patient will benefit from skilled therapeutic intervention in order to improve the following deficits and impairments:  Abnormal gait, Decreased balance, Decreased mobility, Decreased knowledge of use of DME, Decreased coordination, Decreased strength, Postural dysfunction  Visit Diagnosis: Hemiplegia and hemiparesis following cerebral infarction affecting right dominant side (HCC)  Other abnormalities of gait and mobility  Unsteadiness on feet     Problem List Patient Active Problem List   Diagnosis Date Noted  . Acute CVA (cerebrovascular accident) (HCC) 06/19/2018  . CVA (cerebral vascular accident) (HCC) 06/18/2018  . Thoracic aortic aneurysm without rupture (HCC) 01/18/2018  . Ascending aorta dilatation (HCC) 01/18/2017  . Essential hypertension 07/24/2014  . History of DVT (deep vein thrombosis) 07/24/2014  . DVT, lower  extremity (HCC) 07/23/2014  . CAD (coronary artery disease) 11/18/2013  . S/P CABG x 3 11/18/2013  . Cardiomyopathy, ischemic 11/18/2013  . Dyslipidemia 11/18/2013  . CKD (chronic kidney disease) stage 3, GFR 30-59 ml/min (HCC) 11/18/2013  . Impaired fasting glucose 11/18/2013    Zena Amos, PT 07/17/2018, 9:47 AM   [facility] [address] [place], [place], [address] Phone: [phone]   Fax:  [phone]  Name: Patrick Giles MRN: [id number] Date of Birth: Oct 28, 1932

## 2018-07-18 ENCOUNTER — Encounter: Payer: Self-pay | Admitting: Physical Therapy

## 2018-07-18 ENCOUNTER — Ambulatory Visit: Payer: Medicare Other | Admitting: Physical Therapy

## 2018-07-18 DIAGNOSIS — I69351 Hemiplegia and hemiparesis following cerebral infarction affecting right dominant side: Secondary | ICD-10-CM

## 2018-07-18 DIAGNOSIS — R293 Abnormal posture: Secondary | ICD-10-CM

## 2018-07-18 DIAGNOSIS — R2689 Other abnormalities of gait and mobility: Secondary | ICD-10-CM

## 2018-07-18 DIAGNOSIS — R2681 Unsteadiness on feet: Secondary | ICD-10-CM

## 2018-07-18 NOTE — Therapy (Signed)
[facility] [address] [place], [place], [address] Phone: [phone]   Fax:  [phone]  Physical Therapy Treatment  Patient Details  Name: Patrick Giles MRN: [id number] Date of Birth: [birth year] Referring Provider (PT): Delia Heady MD; Wife requested info also sent to PCP Antony Haste; (original referral by hospitalist Leroy Sea, MD)   Encounter Date: 07/18/2018  PT End of Session - 07/18/18 1221    Visit Number  5    Date for PT Re-Evaluation  08/08/18    Authorization Type  [employer]    Authorization Time Period  06/27/18 to 09/25/2018    PT Start Time  1102    PT Stop Time  1145    PT Time Calculation (min)  43 min    Activity Tolerance  Patient tolerated treatment well    Behavior During Therapy  [facility] for tasks assessed/performed       Past Medical History:  Diagnosis Date  . CAD (coronary artery disease)   . Dyslipidemia   . Hypertension   . S/P CABG (coronary artery bypass graft) 1998   LIMA to LAD, VG to PDA, VG to first diagonal  . Stroke [facility])     Past Surgical History:  Procedure Laterality Date  . CHOLECYSTECTOMY  1998  . CORONARY ARTERY BYPASS GRAFT  07/08/1997   LIMA to LAD, reverse SVG to PDA, reverse SVG to first diagonal (Dr. Wynetta Fines)  . TRANSTHORACIC ECHOCARDIOGRAM  11/29/2012   EF 45-50%, grade 1 diastolic dysfunction    There were no vitals filed for this visit.  Subjective Assessment - 07/18/18 1210    Subjective  able to play full round of golf yesterday - walked approx 3.5 miles in total     Patient is accompained by:  Family member   wife   Pertinent History  CAD status post CABG, hypertension, hyperlipidemia, 4.7 cm aortic aneurysm (which has been stable over the last 3 years per patient report)    Patient Stated Goals  get rid of this walker; return to playing golf in time for December tournament he qualified for    Currently in Pain?   No/denies    Multiple Pain Sites  No         [facility] PT Assessment - 07/18/18 1112      Assessment   Medical Diagnosis   left frontal lobe CVA    Referring Provider (PT)  Delia Heady MD; Wife requested info also sent to PCP Antony Haste; (original referral by hospitalist Leroy Sea, MD)    Onset Date/Surgical Date  06/17/18    Prior Therapy  acute PT      Ambulation/Gait   Ambulation/Gait  Yes    Ambulation/Gait Assistance  5: Supervision;6: Modified independent (Device/Increase time)    Ambulation/Gait Assistance Details  holding onto cane - not using; at one time giving cane to wife for her support    Ambulation Distance (Feet)  500 Feet    Assistive device  Straight cane;None    Gait Pattern  Step-through pattern;Decreased stance time - right;Decreased step length - left;Decreased weight shift to right;Wide base of support    Ambulation Surface  Level;Indoor    Gait velocity  3.24 ft/sec without AD    Stairs  Yes    Stairs Assistance  5: Supervision;6: Modified independent (Device/Increase time)    Stair Management Technique  Two rails;Alternating pattern    Number of Stairs  4    Height of  Stairs  6      Standardized Balance Assessment   Standardized Balance Assessment  Berg Balance Test      Berg Balance Test   Sit to Stand  Able to stand without using hands and stabilize independently    Standing Unsupported  Able to stand safely 2 minutes    Sitting with Back Unsupported but Feet Supported on Floor or Stool  Able to sit safely and securely 2 minutes    Stand to Sit  Sits safely with minimal use of hands    Transfers  Able to transfer safely, minor use of hands    Standing Unsupported with Eyes Closed  Able to stand 10 seconds safely    Standing Ubsupported with Feet Together  Able to place feet together independently and stand 1 minute safely    From Standing, Reach Forward with Outstretched Arm  Can reach confidently >25 cm (10")    From Standing Position,  Pick up Object from Floor  Able to pick up shoe safely and easily    From Standing Position, Turn to Look Behind Over each Shoulder  Looks behind from both sides and weight shifts well    Turn 360 Degrees  Able to turn 360 degrees safely one side only in 4 seconds or less    Standing Unsupported, Alternately Place Feet on Step/Stool  Able to stand independently and safely and complete 8 steps in 20 seconds    Standing Unsupported, One Foot in Front  Able to place foot tandem independently and hold 30 seconds    Standing on One Leg  Able to lift leg independently and hold equal to or more than 3 seconds    Total Score  53    Berg comment:  53/56 low fall risk      Functional Gait  Assessment   Gait assessed   Yes    Gait Level Surface  Walks 20 ft in less than 7 sec but greater than 5.5 sec, uses assistive device, slower speed, mild gait deviations, or deviates 6-10 in outside of the 12 in walkway width.    Change in Gait Speed  Able to smoothly change walking speed without loss of balance or gait deviation. Deviate no more than 6 in outside of the 12 in walkway width.    Gait with Horizontal Head Turns  Performs head turns smoothly with slight change in gait velocity (eg, minor disruption to smooth gait path), deviates 6-10 in outside 12 in walkway width, or uses an assistive device.    Gait with Vertical Head Turns  Performs task with slight change in gait velocity (eg, minor disruption to smooth gait path), deviates 6 - 10 in outside 12 in walkway width or uses assistive device    Gait and Pivot Turn  Pivot turns safely in greater than 3 sec and stops with no loss of balance, or pivot turns safely within 3 sec and stops with mild imbalance, requires small steps to catch balance.    Step Over Obstacle  Is able to step over 2 stacked shoe boxes taped together (9 in total height) without changing gait speed. No evidence of imbalance.    Gait with Narrow Base of Support  Ambulates 7-9 steps.    Gait  with Eyes Closed  Walks 20 ft, slow speed, abnormal gait pattern, evidence for imbalance, deviates 10-15 in outside 12 in walkway width. Requires more than 9 sec to ambulate 20 ft.    Ambulating Backwards  Walks 20  ft, no assistive devices, good speed, no evidence for imbalance, normal gait    Steps  Alternating feet, must use rail.    Total Score  22    FGA comment:  22/30 - moderate fall risk                        Balance Exercises - 07/18/18 1250      Balance Exercises: Standing   Other Standing Exercises  gait with head turns (horizontal and vertical around gym), gait with ball toss x 2 laps around gym        PT Education - 07/18/18 1220    Education Details  doing really well - discussion to reduce frequency to 1x/week; will continue to assess functional mobility and balance and may d/c soon    Person(s) Educated  Patient;Spouse    Methods  Explanation    Comprehension  Verbalized understanding       PT Short Term Goals - 07/18/18 1221      PT SHORT TERM GOAL #1   Title  Patient will perform initial basic HEP independently and consistently (Target all STGs 07/27/2018--date extended due to missing 1 week of therapy prior to start due to clinic census)    Time  3    Period  Weeks    Status  Achieved      PT SHORT TERM GOAL #2   Title  Patient will improve Berg to >=51 to demonstrate progress with balance and towards lesser fall risk.     Baseline  06/27/18  47/56;  07/18/18:53/56    Time  3    Period  Weeks    Status  Achieved      PT SHORT TERM GOAL #3   Title  Patient will improve gait velocity with SPC (vs no device) over level, paved, outdoor surfaces by 0.2 ft/sec compared to baseline    Baseline  07/10/18 2.07 with cane; 2.2 no device; no device: 3.24 ft/sec    Time  3    Period  Weeks    Status  Achieved      PT SHORT TERM GOAL #4   Title  Patient able to ascend/descend 3 steps with no rail with/without SPC while carrying simulated bag of  groceries in one hand    Time  3    Period  Weeks    Status  On-going      PT SHORT TERM GOAL #5   Title  At 3 weeks assess FGA for assessing balance during gait.     Baseline  22/30    Time  3    Period  Weeks    Status  Achieved        PT Long Term Goals - 06/27/18 1912      PT LONG TERM GOAL #1   Title  Patient will be independent with updated/finalized HEP and able to verbalize plan for continued community-based exercise/activity upon discharge from PT (TArget all LTGs 08/15/2018)    Time  6    Period  Weeks    Status  New    Target Date  08/15/18      PT LONG TERM GOAL #2   Title  Patient will improve Berg to >=54/56 to show lesser fall risk    Time  6    Period  Weeks    Status  New      PT LONG TERM GOAL #3   Title  Patient will ambulate  over unlevel terrain modified independent (due to slower velocity) x 300 ft to demonstrate safety with playing golf    Time  6    Period  Weeks    Status  New      PT LONG TERM GOAL #4   Title  Patient will ascend/descend 3 steps with no rail, no device and carrying object requiring both hands independently    Time  6    Period  Weeks    Status  New      PT LONG TERM GOAL #5   Title  Patient will score >=26/30 on FGA    Time  6    Period  Weeks    Status  New            Plan - 07/18/18 1240    Clinical Impression Statement  Assessment of STG today with patient demonstrating great improvement since start of PT. Of note, patient also reporting ability to return to full round of golf yesterday with prolonged walking distance without issue. Able to swing golf club without LOB as well as bend to pick up/place ball without LOB. Discussion to reduce frequncy to 1x/week today as patient and PT feel comfortable with this. Patient making excellent progress towards LTG's. Discussion on reduced use of cane as patient tends to not use anyways - pateitn may still carry cane when wlaking long distances. Education to begin walking  program.     Rehab Potential  Good    Clinical Impairments Affecting Rehab Potential  none    PT Frequency  2x / week    PT Duration  6 weeks    PT Treatment/Interventions  ADLs/Self Care Home Management;Aquatic Therapy;Gait training;DME Instruction;Stair training;Functional mobility training;Therapeutic activities;Therapeutic exercise;Balance training;Neuromuscular re-education;Manual techniques;Patient/family education;Passive range of motion    PT Next Visit Plan  higher level balance eliciting hip strategy--especially for LOB posteriorly; possibly can recommend d/c cane even in  community    Consulted and Agree with Plan of Care  Patient;Family member/caregiver    Family Member Consulted  wife       Patient will benefit from skilled therapeutic intervention in order to improve the following deficits and impairments:  Abnormal gait, Decreased balance, Decreased mobility, Decreased knowledge of use of DME, Decreased coordination, Decreased strength, Postural dysfunction  Visit Diagnosis: Hemiplegia and hemiparesis following cerebral infarction affecting right dominant side (HCC)  Other abnormalities of gait and mobility  Unsteadiness on feet  Abnormal posture     Problem List Patient Active Problem List   Diagnosis Date Noted  . Acute CVA (cerebrovascular accident) (HCC) 06/19/2018  . CVA (cerebral vascular accident) (HCC) 06/18/2018  . Thoracic aortic aneurysm without rupture (HCC) 01/18/2018  . Ascending aorta dilatation (HCC) 01/18/2017  . Essential hypertension 07/24/2014  . History of DVT (deep vein thrombosis) 07/24/2014  . DVT, lower extremity (HCC) 07/23/2014  . CAD (coronary artery disease) 11/18/2013  . S/P CABG x 3 11/18/2013  . Cardiomyopathy, ischemic 11/18/2013  . Dyslipidemia 11/18/2013  . CKD (chronic kidney disease) stage 3, GFR 30-59 ml/min (HCC) 11/18/2013  . Impaired fasting glucose 11/18/2013     Kipp Laurence, PT, DPT Supplemental Physical  Therapist 07/18/18 12:51 PM Pager: [phone] Office: [phone]  [facility] [address] [place], [place], [address] Phone: [phone]   Fax:  [phone]  Name: Patrick Giles MRN: [id number] Date of Birth: 1933/03/19

## 2018-07-23 ENCOUNTER — Ambulatory Visit: Payer: Medicare Other | Attending: Internal Medicine

## 2018-07-23 DIAGNOSIS — I69351 Hemiplegia and hemiparesis following cerebral infarction affecting right dominant side: Secondary | ICD-10-CM | POA: Diagnosis present

## 2018-07-23 DIAGNOSIS — R2681 Unsteadiness on feet: Secondary | ICD-10-CM | POA: Insufficient documentation

## 2018-07-23 DIAGNOSIS — R2689 Other abnormalities of gait and mobility: Secondary | ICD-10-CM | POA: Diagnosis not present

## 2018-07-23 DIAGNOSIS — R293 Abnormal posture: Secondary | ICD-10-CM | POA: Insufficient documentation

## 2018-07-23 NOTE — Therapy (Signed)
[facility] [address] [place], [place], [address] Phone: [phone]   Fax:  [phone]  Physical Therapy Treatment  Patient Details  Name: Patrick Giles MRN: [id number] Date of Birth: 02-01-1933 Referring Provider (PT): Delia Heady MD; Wife requested info also sent to PCP Antony Haste; (original referral by hospitalist Leroy Sea, MD)   Encounter Date: 07/23/2018  PT End of Session - 07/23/18 1144    Visit Number  6    Number of Visits  13    Date for PT Re-Evaluation  08/08/18    Authorization Type  [employer] MEDICARE    Authorization Time Period  06/27/18 to 09/25/2018    PT Start Time  1051    PT Stop Time  1135    PT Time Calculation (min)  44 min    Equipment Utilized During Treatment  --   min guard to S prn   Activity Tolerance  Patient tolerated treatment well    Behavior During Therapy  [facility] for tasks assessed/performed       Past Medical History:  Diagnosis Date  . CAD (coronary artery disease)   . Dyslipidemia   . Hypertension   . S/P CABG (coronary artery bypass graft) 1998   LIMA to LAD, VG to PDA, VG to first diagonal  . Stroke [facility])     Past Surgical History:  Procedure Laterality Date  . CHOLECYSTECTOMY  1998  . CORONARY ARTERY BYPASS GRAFT  07/08/1997   LIMA to LAD, reverse SVG to PDA, reverse SVG to first diagonal (Dr. Wynetta Fines)  . TRANSTHORACIC ECHOCARDIOGRAM  11/29/2012   EF 45-50%, grade 1 diastolic dysfunction    There were no vitals filed for this visit.  Subjective Assessment - 07/23/18 1053    Subjective  Pt denied falls or changes since last visit. Pt was able to walk two days in the park, about 30 minutes each time. Pt played another round of golf on Friday (07/20/18).     Pertinent History  CAD status post CABG, hypertension, hyperlipidemia, 4.7 cm aortic aneurysm (which has been stable over the last 3 years per patient report)    Patient Stated Goals   get rid of this walker; return to playing golf in time for December tournament he qualified for    Currently in Pain?  No/denies                       [facility] Adult PT Treatment/Exercise - 07/23/18 1056      Ambulation/Gait   Ambulation/Gait  Yes    Ambulation/Gait Assistance  5: Supervision;4: Min guard    Ambulation/Gait Assistance Details  Pt amb. outdoors on paved and grassy terrain, min guard during one LOB on grass which pt self corrected with stepping strategy. Min guard over grass for safety and S over paved surface. Cues for weight shifting over inclines/declines. Pt required two standing rest breaks during and after gait training 2/2 R hip fatigue.     Ambulation Distance (Feet)  1000 Feet    Assistive device  None    Gait Pattern  Step-through pattern;Decreased stance time - right;Decreased step length - left;Decreased weight shift to right;Wide base of support    Ambulation Surface  Level;Unlevel;Indoor;Outdoor;Paved;Grass      Therex: Access Code: [email or web address]  URL: [email or web address]  Date: 07/23/2018  Prepared by: Zerita Boers   Exercises  Single Leg Stance with Support - 10 reps - 1 sets - 2x daily -  5x weekly  (verbally reviewed only) Supine Bridge - 10 reps - 3 sets - 1x daily - 3x weekly  (verbally reviewed only) Sit to stand in staggered stance - 10 reps - 1 sets - 2x daily - 5x weekly  (verbally reviewed only) Clamshell - 10 reps - 3 sets - 1x daily - 3x weekly (verbally reviewed only) Supine Figure 4 Piriformis Stretch - 3 reps - 1 sets - 30 hold - 2-3x daily - 7x weekly (therex) Seated Hamstring Stretch - 3 reps - 1 sets - 30 hold - 2-3x daily - 7x weekly (therex)  Pt required cues and demo for technique during new flexibility exercises. Pt reported B hips felt "better" after stretching.       Balance Exercises - 07/23/18 1138      Balance Exercises: Standing   Other Standing Exercises  Performed on ramp with and without blue  mat facing incline and decline with min guard for safety: pt performed ant/post stepping with each LE  x5/activity/suface to improve stepping and balance righting reactions.         PT Education - 07/23/18 1140    Education Details  PT added flexibility activities to HEP to improve hip flexibility.     Person(s) Educated  Patient    Methods  Explanation;Demonstration;Tactile cues;Verbal cues;Handout    Comprehension  Returned demonstration;Verbalized understanding       PT Short Term Goals - 07/18/18 1221      PT SHORT TERM GOAL #1   Title  Patient will perform initial basic HEP independently and consistently (Target all STGs 07/27/2018--date extended due to missing 1 week of therapy prior to start due to clinic census)    Time  3    Period  Weeks    Status  Achieved      PT SHORT TERM GOAL #2   Title  Patient will improve Berg to >=51 to demonstrate progress with balance and towards lesser fall risk.     Baseline  06/27/18  47/56;  07/18/18:53/56    Time  3    Period  Weeks    Status  Achieved      PT SHORT TERM GOAL #3   Title  Patient will improve gait velocity with SPC (vs no device) over level, paved, outdoor surfaces by 0.2 ft/sec compared to baseline    Baseline  07/10/18 2.07 with cane; 2.2 no device; no device: 3.24 ft/sec    Time  3    Period  Weeks    Status  Achieved      PT SHORT TERM GOAL #4   Title  Patient able to ascend/descend 3 steps with no rail with/without SPC while carrying simulated bag of groceries in one hand    Time  3    Period  Weeks    Status  On-going      PT SHORT TERM GOAL #5   Title  At 3 weeks assess FGA for assessing balance during gait.     Baseline  22/30    Time  3    Period  Weeks    Status  Achieved        PT Long Term Goals - 06/27/18 1912      PT LONG TERM GOAL #1   Title  Patient will be independent with updated/finalized HEP and able to verbalize plan for continued community-based exercise/activity upon discharge from PT  (TArget all LTGs 08/15/2018)    Time  6    Period  Weeks    Status  New    Target Date  08/15/18      PT LONG TERM GOAL #2   Title  Patient will improve Berg to >=54/56 to show lesser fall risk    Time  6    Period  Weeks    Status  New      PT LONG TERM GOAL #3   Title  Patient will ambulate over unlevel terrain modified independent (due to slower velocity) x 300 ft to demonstrate safety with playing golf    Time  6    Period  Weeks    Status  New      PT LONG TERM GOAL #4   Title  Patient will ascend/descend 3 steps with no rail, no device and carrying object requiring both hands independently    Time  6    Period  Weeks    Status  New      PT LONG TERM GOAL #5   Title  Patient will score >=26/30 on FGA    Time  6    Period  Weeks    Status  New            Plan - 07/23/18 1145    Clinical Impression Statement  Today's skilled session focused on gait training over uneven, grassy/paved surfaces to ensure safety while pt amb. at golf course. Pt experienced one LOB when traversing decline on grassy terrain but self corrected with stepping strategy and min guard. Pt c/o R hip "catching" during gait, therefore, PT added hip flexibility exercises as pt's B hamstrings and piriformis found to have decr. flexibility. PT also had pt perform balance activities on inclines/declines. Pt would continue to benefit from skilled PT to improve safety during functional mobility.     Rehab Potential  Good    Clinical Impairments Affecting Rehab Potential  none    PT Frequency  2x / week    PT Duration  6 weeks    PT Treatment/Interventions  ADLs/Self Care Home Management;Aquatic Therapy;Gait training;DME Instruction;Stair training;Functional mobility training;Therapeutic activities;Therapeutic exercise;Balance training;Neuromuscular re-education;Manual techniques;Patient/family education;Passive range of motion    PT Next Visit Plan  higher level balance eliciting hip strategy--especially  for LOB posteriorly; possibly can recommend d/c cane even in  community    Consulted and Agree with Plan of Care  Patient;Family member/caregiver    Family Member Consulted  wife       Patient will benefit from skilled therapeutic intervention in order to improve the following deficits and impairments:  Abnormal gait, Decreased balance, Decreased mobility, Decreased knowledge of use of DME, Decreased coordination, Decreased strength, Postural dysfunction  Visit Diagnosis: Other abnormalities of gait and mobility  Unsteadiness on feet  Hemiplegia and hemiparesis following cerebral infarction affecting right dominant side (HCC)  Abnormal posture     Problem List Patient Active Problem List   Diagnosis Date Noted  . Acute CVA (cerebrovascular accident) (HCC) 06/19/2018  . CVA (cerebral vascular accident) (HCC) 06/18/2018  . Thoracic aortic aneurysm without rupture (HCC) 01/18/2018  . Ascending aorta dilatation (HCC) 01/18/2017  . Essential hypertension 07/24/2014  . History of DVT (deep vein thrombosis) 07/24/2014  . DVT, lower extremity (HCC) 07/23/2014  . CAD (coronary artery disease) 11/18/2013  . S/P CABG x 3 11/18/2013  . Cardiomyopathy, ischemic 11/18/2013  . Dyslipidemia 11/18/2013  . CKD (chronic kidney disease) stage 3, GFR 30-59 ml/min (HCC) 11/18/2013  . Impaired fasting glucose 11/18/2013    Marna Weniger L 07/23/2018, 11:48 AM  [facility] [address] [place], [place], [address] Phone: [phone]   Fax:  [phone]  Name: Patrick Giles MRN: [id number] Date of Birth: [birth year]  Zerita Boers, PT,DPT 07/23/18 11:50 AM Phone: [phone] Fax: [phone]

## 2018-07-23 NOTE — Patient Instructions (Signed)
Access Code: [email or web address]  URL: [email or web address]  Date: 07/23/2018  Prepared by: Zerita Boers   Exercises  Single Leg Stance with Support - 10 reps - 1 sets - 2x daily - 5x weekly  (verbally reviewed only) Supine Bridge - 10 reps - 3 sets - 1x daily - 3x weekly  (verbally reviewed only) Sit to stand in staggered stance - 10 reps - 1 sets - 2x daily - 5x weekly  (verbally reviewed only) Clamshell - 10 reps - 3 sets - 1x daily - 3x weekly (verbally reviewed only) Supine Figure 4 Piriformis Stretch - 3 reps - 1 sets - 30 hold - 2-3x daily - 7x weekly  Seated Hamstring Stretch - 3 reps - 1 sets - 30 hold - 2-3x daily - 7x weekly

## 2018-07-25 ENCOUNTER — Ambulatory Visit: Payer: Medicare Other | Admitting: Physical Therapy

## 2018-07-31 ENCOUNTER — Ambulatory Visit: Payer: Medicare Other

## 2018-07-31 DIAGNOSIS — R2689 Other abnormalities of gait and mobility: Secondary | ICD-10-CM

## 2018-07-31 DIAGNOSIS — R2681 Unsteadiness on feet: Secondary | ICD-10-CM

## 2018-07-31 NOTE — Therapy (Signed)
[facility] [address] [place], [place], [address] Phone: [phone]   Fax:  [phone]  Physical Therapy Treatment  Patient Details  Name: Patrick Giles MRN: [id number] Date of Birth: [birth year] Referring Provider (PT): Antony Contras MD; Wife requested info also sent to PCP Anastasia Pall; (original referral by hospitalist Thurnell Lose, MD)   Encounter Date: 07/31/2018  PT End of Session - 07/31/18 1148    Visit Number  7    Number of Visits  13    Date for PT Re-Evaluation  08/08/18    Authorization Type  [employer] MEDICARE    Authorization Time Period  06/27/18 to 09/25/2018    PT Start Time  1106    PT Stop Time  1137   d/c   PT Time Calculation (min)  31 min    Equipment Utilized During Treatment  Gait belt    Activity Tolerance  Patient tolerated treatment well    Behavior During Therapy  [facility] for tasks assessed/performed       Past Medical History:  Diagnosis Date  . CAD (coronary artery disease)   . Dyslipidemia   . Hypertension   . S/P CABG (coronary artery bypass graft) 1998   LIMA to LAD, VG to PDA, VG to first diagonal  . Stroke [facility])     Past Surgical History:  Procedure Laterality Date  . CHOLECYSTECTOMY  1998  . CORONARY ARTERY BYPASS GRAFT  07/08/1997   LIMA to LAD, reverse SVG to PDA, reverse SVG to first diagonal (Dr. Festus Barren)  . TRANSTHORACIC ECHOCARDIOGRAM  11/29/2012   EF 34-19%, grade 1 diastolic dysfunction    There were no vitals filed for this visit.  Subjective Assessment - 07/31/18 1110    Subjective  Pt has stepped up his walking, about 1.5 miles in an hour or so. Pt denied falls since last visit. Pt reports HEP is going well and does not have questions.     Patient is accompained by:  Family member   wife: Patrick Giles   Pertinent History  CAD status post CABG, hypertension, hyperlipidemia, 4.7 cm aortic aneurysm (which has been stable over the last 3 years per  patient report)    Patient Stated Goals  get rid of this walker; return to playing golf in time for December tournament he qualified for    Currently in Pain?  No/denies         [facility] PT Assessment - 07/31/18 1122      Functional Gait  Assessment   Gait assessed   Yes    Gait Level Surface  Walks 20 ft in less than 5.5 sec, no assistive devices, good speed, no evidence for imbalance, normal gait pattern, deviates no more than 6 in outside of the 12 in walkway width.   4.85 sec.    Change in Gait Speed  Able to smoothly change walking speed without loss of balance or gait deviation. Deviate no more than 6 in outside of the 12 in walkway width.    Gait with Horizontal Head Turns  Performs head turns smoothly with slight change in gait velocity (eg, minor disruption to smooth gait path), deviates 6-10 in outside 12 in walkway width, or uses an assistive device.    Gait with Vertical Head Turns  Performs head turns with no change in gait. Deviates no more than 6 in outside 12 in walkway width.    Gait and Pivot Turn  Pivot turns safely within 3  sec and stops quickly with no loss of balance.    Step Over Obstacle  Is able to step over 2 stacked shoe boxes taped together (9 in total height) without changing gait speed. No evidence of imbalance.    Gait with Narrow Base of Support  Ambulates 7-9 steps.    Gait with Eyes Closed  Walks 20 ft, slow speed, abnormal gait pattern, evidence for imbalance, deviates 10-15 in outside 12 in walkway width. Requires more than 9 sec to ambulate 20 ft.    Ambulating Backwards  Walks 20 ft, no assistive devices, good speed, no evidence for imbalance, normal gait    Steps  Alternating feet, must use rail.    Total Score  25    FGA comment:  25/30: low falls risk.                    [facility] Adult PT Treatment/Exercise - 07/31/18 1125      Standardized Balance Assessment   Standardized Balance Assessment  Berg Balance Test      Berg Balance Test    Sit to Stand  Able to stand without using hands and stabilize independently    Standing Unsupported  Able to stand safely 2 minutes    Sitting with Back Unsupported but Feet Supported on Floor or Stool  Able to sit safely and securely 2 minutes    Stand to Sit  Sits safely with minimal use of hands    Transfers  Able to transfer safely, minor use of hands    Standing Unsupported with Eyes Closed  Able to stand 10 seconds safely    Standing Ubsupported with Feet Together  Able to place feet together independently and stand 1 minute safely    From Standing, Reach Forward with Outstretched Arm  Can reach confidently >25 cm (10")   RUE   From Standing Position, Pick up Object from Floor  Able to pick up shoe safely and easily    From Standing Position, Turn to Look Behind Over each Shoulder  Looks behind from both sides and weight shifts well    Turn 360 Degrees  Able to turn 360 degrees safely one side only in 4 seconds or less    Standing Unsupported, Alternately Place Feet on Step/Stool  Able to stand independently and safely and complete 8 steps in 20 seconds    Standing Unsupported, One Foot in Front  Able to place foot tandem independently and hold 30 seconds    Standing on One Leg  Able to lift leg independently and hold 5-10 seconds   RLE   Total Score  54            Self Care: PT Education - 07/31/18 1146    Education Details  PT discussed potential d/c today as pt reported he feels really great about his progress. PT discussed goal progress and outcome measure results. PT discussed continuing HEP upon d/c, pt agreeable.     Person(s) Educated  Patient;Spouse    Methods  Explanation    Comprehension  Verbalized understanding       PT Short Term Goals - 07/18/18 1221      PT SHORT TERM GOAL #1   Title  Patient will perform initial basic HEP independently and consistently (Target all STGs 07/27/2018--date extended due to missing 1 week of therapy prior to start due to clinic  census)    Time  3    Period  Weeks    Status  Achieved      PT SHORT TERM GOAL #2   Title  Patient will improve Berg to >=51 to demonstrate progress with balance and towards lesser fall risk.     Baseline  06/27/18  47/56;  07/18/18:53/56    Time  3    Period  Weeks    Status  Achieved      PT SHORT TERM GOAL #3   Title  Patient will improve gait velocity with SPC (vs no device) over level, paved, outdoor surfaces by 0.2 ft/sec compared to baseline    Baseline  07/10/18 2.07 with cane; 2.2 no device; no device: 3.24 ft/sec    Time  3    Period  Weeks    Status  Achieved      PT SHORT TERM GOAL #4   Title  Patient able to ascend/descend 3 steps with no rail with/without SPC while carrying simulated bag of groceries in one hand    Time  3    Period  Weeks    Status  On-going      PT SHORT TERM GOAL #5   Title  At 3 weeks assess FGA for assessing balance during gait.     Baseline  22/30    Time  3    Period  Weeks    Status  Achieved        PT Long Term Goals - 07/31/18 1150      PT LONG TERM GOAL #1   Title  Patient will be independent with updated/finalized HEP and able to verbalize plan for continued community-based exercise/activity upon discharge from PT (TArget all LTGs 08/15/2018)    Time  6    Period  Weeks    Status  Achieved      PT LONG TERM GOAL #2   Title  Patient will improve Berg to >=54/56 to show lesser fall risk    Time  6    Period  Weeks    Status  Achieved      PT LONG TERM GOAL #3   Title  Patient will ambulate over unlevel terrain modified independent (due to slower velocity) x 300 ft to demonstrate safety with playing golf    Time  6    Period  Weeks    Status  Partially Met      PT LONG TERM GOAL #4   Title  Patient will ascend/descend 3 steps with no rail, no device and carrying object requiring both hands independently    Time  6    Period  Weeks    Status  Achieved      PT LONG TERM GOAL #5   Title  Patient will score >=26/30 on  FGA    Time  6    Period  Weeks    Status  Partially Met            Plan - 07/31/18 1148    Clinical Impression Statement  Pt met LTGs 1, 2, and 4. Pt partially LTGs 3 and 5. LTG 3 assessed based on amb. outdoors last session, due to inclement weather today. Pt also amb. on golf course with no falls reported. Please see d/c summary for details.     Rehab Potential  Good    Clinical Impairments Affecting Rehab Potential  none    PT Frequency  2x / week    PT Duration  6 weeks    PT Treatment/Interventions  ADLs/Self Care Home Management;Aquatic Therapy;Gait training;DME Instruction;Stair training;Functional  mobility training;Therapeutic activities;Therapeutic exercise;Balance training;Neuromuscular re-education;Manual techniques;Patient/family education;Passive range of motion    PT Next Visit Plan  d/c    Consulted and Agree with Plan of Care  Patient;Family member/caregiver    Family Member Consulted  wife       Patient will benefit from skilled therapeutic intervention in order to improve the following deficits and impairments:  Abnormal gait, Decreased balance, Decreased mobility, Decreased knowledge of use of DME, Decreased coordination, Decreased strength, Postural dysfunction  Visit Diagnosis: Other abnormalities of gait and mobility  Unsteadiness on feet     Problem List Patient Active Problem List   Diagnosis Date Noted  . Acute CVA (cerebrovascular accident) ([place]) 06/19/2018  . CVA (cerebral vascular accident) ([place]) 06/18/2018  . Thoracic aortic aneurysm without rupture ([place]) 01/18/2018  . Ascending aorta dilatation (HCC) 01/18/2017  . Essential hypertension 07/24/2014  . History of DVT (deep vein thrombosis) 07/24/2014  . DVT, lower extremity ([place]) 07/23/2014  . CAD (coronary artery disease) 11/18/2013  . S/P CABG x 3 11/18/2013  . Cardiomyopathy, ischemic 11/18/2013  . Dyslipidemia 11/18/2013  . CKD (chronic kidney disease) stage 3, GFR 30-59 ml/min (HCC)  11/18/2013  . Impaired fasting glucose 11/18/2013    Ehren Berisha L 07/31/2018, 11:51 AM  [place] [address] [place], [place], [address] Phone: [phone]   Fax:  [phone]  Name: Patrick Giles MRN: [id number] Date of Birth: 1933-09-19  PHYSICAL THERAPY DISCHARGE SUMMARY  Visits from Start of Care: 7  Current functional level related to goals / functional outcomes: PT Long Term Goals - 07/31/18 1150      PT LONG TERM GOAL #1   Title  Patient will be independent with updated/finalized HEP and able to verbalize plan for continued community-based exercise/activity upon discharge from PT (TArget all LTGs 08/15/2018)    Time  6    Period  Weeks    Status  Achieved      PT LONG TERM GOAL #2   Title  Patient will improve Berg to >=54/56 to show lesser fall risk    Time  6    Period  Weeks    Status  Achieved      PT LONG TERM GOAL #3   Title  Patient will ambulate over unlevel terrain modified independent (due to slower velocity) x 300 ft to demonstrate safety with playing golf    Time  6    Period  Weeks    Status  Partially Met      PT LONG TERM GOAL #4   Title  Patient will ascend/descend 3 steps with no rail, no device and carrying object requiring both hands independently    Time  6    Period  Weeks    Status  Achieved      PT LONG TERM GOAL #5   Title  Patient will score >=26/30 on FGA    Time  6    Period  Weeks    Status  Partially Met         Remaining deficits: No significant deficits remain, just intermittent LOB over outdoors terrain, which pt can self correct with stepping strategy.    Education / Equipment: HEP  Plan: Patient agrees to discharge.  Patient goals were met. Patient is being discharged due to meeting the stated rehab goals.  ?????          Geoffry Paradise, PT,DPT 07/31/18 11:53 AM Phone: [phone] Fax: [phone]

## 2018-08-02 ENCOUNTER — Ambulatory Visit: Payer: Medicare Other | Admitting: Physical Therapy

## 2018-08-06 ENCOUNTER — Ambulatory Visit: Payer: Medicare Other | Admitting: Physical Therapy

## 2018-08-09 ENCOUNTER — Ambulatory Visit: Payer: Medicare Other

## 2018-08-13 ENCOUNTER — Ambulatory Visit: Payer: Medicare Other | Admitting: Physical Therapy

## 2018-08-14 ENCOUNTER — Ambulatory Visit: Payer: Medicare Other

## 2018-10-18 ENCOUNTER — Encounter: Payer: Self-pay | Admitting: Adult Health

## 2018-10-18 ENCOUNTER — Ambulatory Visit: Payer: Medicare Other | Admitting: Adult Health

## 2018-10-18 VITALS — BP 158/72 | HR 60 | Ht 71.5 in | Wt 209.2 lb

## 2018-10-18 DIAGNOSIS — I10 Essential (primary) hypertension: Secondary | ICD-10-CM | POA: Diagnosis not present

## 2018-10-18 DIAGNOSIS — E785 Hyperlipidemia, unspecified: Secondary | ICD-10-CM | POA: Diagnosis not present

## 2018-10-18 DIAGNOSIS — I6381 Other cerebral infarction due to occlusion or stenosis of small artery: Secondary | ICD-10-CM

## 2018-10-18 DIAGNOSIS — I6523 Occlusion and stenosis of bilateral carotid arteries: Secondary | ICD-10-CM | POA: Diagnosis not present

## 2018-10-18 NOTE — Progress Notes (Signed)
[facility] [address] [place]. [place] [address] [phone]       OFFICE FOLLOW-UP NOTE  Patrick Giles Date of Birth:  April 15, 1933 Medical Record Number:  [id number]   Chief Complaint  Patient presents with  . Follow-up    Stroke follow up room in back hallway with wife Malachi Bonds      HPI:  10/18/18  Patrick Giles is a 85 y.o. who is being followed in this office for stroke follow-up in 05/2018.  He is accompanied by his wife.  Overall, patient has been doing well from a stroke standpoint without residual deficits.  He has since completed all therapies and continues to exercise daily ambulating approx 2 miles per day.  Continues on Plavix without side effects of bleeding or bruising.  Continues on atorvastatin without side effects myalgias.  Blood pressure today 158/72. He does monitor at home and this is his typical levels. He does continue to follow with PCP for management. He continues to follow with vascular surgery in [place] with return visit in April with repeat ultrasound. Denies new or worsening stroke/TIA symptoms.   History summary: Initial visit 07/12/2018 Dr. Pearlean Brownie Patrick Giles is a 85 year old Caucasian male seen today for initial office follow-up visit following hospital admission for stroke in September 2019.  History is obtained from the patient and review of electronic medical records.  I personally reviewed imaging films.HPI:( Dr Amada Jupiter 06/18/18 ) Patrick Giles is a 85 y.o. male with a history of CAD, hypertension, hyperlipidemia who presents with right-sided weakness that was present on awakening.  He states that he went to bed feeling normal, when he woke up he noticed that he was having problems with his right side.  Due to this he activated 911 and EMS was concerned that he might have some neglect and therefore activated a code stroke for the 24-hour window.He does take BP meds, but does nto currently take any blood thinners. He was on  elliquis previously due to DVT, but is not currently.  LKW: 10 PM on 06/17/18 tpa given?: no, out of window MRI scan of the brain showed a 1 cm area of acute infarction involving left posterior frontal periventricular and subcortical white matter near the vertex.  CT angiogram showed calcified plaque at both carotid bifurcations but without significant stenosis.  There was 50 to 70% supraclinoid ICA stenosis bilaterally.  Transthoracic echo showed normal ejection fraction was suboptimal due to poor acoustic windows.  LDL cholesterol was 68 mg percent and hemoglobin A1c was 5.9.  Patient was started on aspirin and Plavix for 3 weeks and has since stopped aspirin.  He is doing well.  He feels right-sided weakness is much improved.  He is currently is getting outpatient physical and occupational therapy and doing well.  He recently saw vascular surgeon in [place] who also recommended conservative medical therapy.  He states his blood pressure is well controlled at home though today it is elevated in office at 140/66.  He is tolerating Plavix well without bleeding or bruising.  He is also tolerating Lipitor without muscle aches and pains.  He has no new complaints today.    ROS:   14 system review of systems is positive for no complaints and all other systems negative  PMH:  Past Medical History:  Diagnosis Date  . CAD (coronary artery disease)   . Dyslipidemia   . Hypertension   . S/P CABG (coronary artery bypass graft) 1998   LIMA to LAD,  VG to PDA, VG to first diagonal  . Stroke [facility](HCC)     Social History:  Social History   Socioeconomic History  . Marital status: Married    Spouse name: Not on file  . Number of children: 5  . Years of education: 7213  . Highest education level: Not on file  Occupational History  . Occupation: Retired  Engineer, productionocial Needs  . Financial resource strain: Not on file  . Food insecurity:    Worry: Not on file    Inability: Not on file  . Transportation needs:      Medical: Not on file    Non-medical: Not on file  Tobacco Use  . Smoking status: Former Smoker    Types: Cigars    Last attempt to quit: 09/19/1988    Years since quitting: 30.0  . Smokeless tobacco: Never Used  Substance and Sexual Activity  . Alcohol use: No    Alcohol/week: 0.0 standard drinks  . Drug use: No  . Sexual activity: Not on file  Lifestyle  . Physical activity:    Days per week: Not on file    Minutes per session: Not on file  . Stress: Not on file  Relationships  . Social connections:    Talks on phone: Not on file    Gets together: Not on file    Attends religious service: Not on file    Active member of club or organization: Not on file    Attends meetings of clubs or organizations: Not on file    Relationship status: Not on file  . Intimate partner violence:    Fear of current or ex partner: Not on file    Emotionally abused: Not on file    Physically abused: Not on file    Forced sexual activity: Not on file  Other Topics Concern  . Not on file  Social History Narrative  . Not on file    Medications:   Current Outpatient Medications on File Prior to Visit  Medication Sig Dispense Refill  . amLODipine-benazepril (LOTREL) 5-20 MG capsule Take 1 capsule by mouth daily.    Marland Kitchen. atorvastatin (LIPITOR) 40 MG tablet TAKE 1 TABLET BY MOUTH EVERY DAY AT 6PM (Patient taking differently: Take 40 mg by mouth daily at 6 PM. ) 90 tablet 3  . clopidogrel (PLAVIX) 75 MG tablet Take 1 tablet (75 mg total) by mouth daily. 30 tablet 3  . clopidogrel (PLAVIX) 75 MG tablet Take by mouth.    Marland Kitchen. GARLIC PO Take 1,6101,000 mg by mouth daily.     . metoprolol succinate (TOPROL-XL) 25 MG 24 hr tablet TAKE 1/2 A TABLET BY MOUTH EVERY DAY (Patient taking differently: Take 12.5 mg by mouth daily. Take one tablet) 15 tablet 6  . Multiple Vitamins-Minerals (ALIVE MENS ENERGY PO) Take 1 tablet by mouth daily.     . Omega-3 Fatty Acids (FISH OIL) 1200 MG CAPS Take 2 capsules by mouth daily.     . pantoprazole (PROTONIX) 40 MG tablet Take 1 tablet (40 mg total) by mouth daily. 30 tablet 2   No current facility-administered medications on file prior to visit.     Allergies:  No Known Allergies  Physical Exam General: well developed, well nourished pleasant elderly male, seated, in no evident distress Head: head normocephalic and atraumatic.  Neck: supple with no carotid or supraclavicular bruits Cardiovascular: regular rate and rhythm, no murmurs Musculoskeletal: no deformity Skin:  no rash/petichiae Vascular:  Normal pulses all extremities Vitals:  10/18/18 1250  BP: (!) 158/72  Pulse: 60   Neurologic Exam Mental Status: Awake and fully alert. Oriented to place and time. Recent and remote memory intact. Attention span, concentration and fund of knowledge appropriate. Mood and affect appropriate.  Cranial Nerves: Fundoscopic exam reveals sharp disc margins. Pupils equal, briskly reactive to light. Extraocular movements full without nystagmus. Visual fields full to confrontation. Hearing diminished bilaterally facial sensation intact. Face, tongue, palate moves normally and symmetrically.  Motor: Normal bulk and tone. Normal strength in all tested extremity muscles. Sensory.: intact to touch ,pinprick .position and vibratory sensation.  Coordination: Rapid alternating movements normal in all extremities. Finger-to-nose and heel-to-shin performed accurately bilaterally. Gait and Station: Arises from chair without difficulty. Stance is normal. Gait demonstrates normal stride length and balance . Able to heel, toe and tandem walk with mild difficulty.  Reflexes: 1+ and symmetric. Toes downgoing.      ASSESSMENT: 85 year old male with left frontal subcortical infarct in September 2019 possibly lacunar versus arterial embolic from cavernous carotid stenosis.  Patient is doing well and has made good recovery.  Vascular risk factors of hypertension, hyperlipidemia and age.  He  returns today for follow-up visit and overall doing well from a stroke standpoint without residual deficits or recurring of symptoms.    PLAN: 1. stroke : Continue clopidogrel 75 mg daily  and atorvastatin for secondary stroke prevention. Maintain strict control of hypertension with blood pressure goal below 130/90, diabetes with hemoglobin A1c goal below 6.5% and cholesterol with LDL cholesterol (bad cholesterol) goal below 70 mg/dL.  I also advised the patient to eat a healthy diet with plenty of whole grains, cereals, fruits and vegetables, exercise regularly with at least 30 minutes of continuous activity daily and maintain ideal body weight. 2. HTN: Advised to continue current treatment regimen.  Today's BP 158/72.  Advised to continue to monitor at home along with continued follow-up with PCP for management 3. HLD: Advised to continue current treatment regimen along with continued follow-up with PCP for future prescribing and monitoring of lipid panel 4. Carotid stenosis: Continue to follow with vascular surgery with scheduled appointment in April for repeat ultrasound  Patient stable from stroke standpoint is being followed closely by PCP for management of stroke risk factors along with following closely with vascular surgery.  At this time, patient can follow-up on an as-needed basis.    Greater than 50% of time during this 25 minute visit was spent on counseling,explanation of diagnosis, planning of further management, discussion with patient and family and coordination of care  George Hugh, [facility]  [facility] [address] [place], [place] [address]  Phone [phone] Fax [phone] Note: This document was prepared with digital dictation and possible smart phrase technology. Any transcriptional errors that result from this process are unintentional.

## 2018-10-18 NOTE — Patient Instructions (Addendum)
Continue clopidogrel 75 mg daily  and atorvastatin (Lipitor) for secondary stroke prevention  Continue to follow up with PCP regarding cholesterol and blood pressure management   Continue to stay active and maintain a healthy diet  Continue to monitor blood pressure at home  Maintain strict control of hypertension with blood pressure goal below 130/90, diabetes with hemoglobin A1c goal below 6.5% and cholesterol with LDL cholesterol (bad cholesterol) goal below 70 mg/dL. I also advised the patient to eat a healthy diet with plenty of whole grains, cereals, fruits and vegetables, exercise regularly and maintain ideal body weight.  Followup in the future with me as needed or call earlier if needed       Thank you for coming to see [place] at [facility]. I hope we have been able to provide you high quality care today.  You may receive a patient satisfaction survey over the next few weeks. We would appreciate your feedback and comments so that we may continue to improve ourselves and the health of our patients.

## 2018-10-18 NOTE — Progress Notes (Signed)
I agree with the above plan 

## 2019-01-18 ENCOUNTER — Telehealth: Payer: Self-pay

## 2019-01-18 NOTE — Telephone Encounter (Signed)
Attempted to contact pt to change appointment to virtual visit. No answer and unable to leave message.

## 2019-01-21 ENCOUNTER — Ambulatory Visit: Payer: Medicare Other | Admitting: Internal Medicine

## 2019-03-28 ENCOUNTER — Other Ambulatory Visit: Payer: Self-pay | Admitting: *Deleted

## 2019-03-28 ENCOUNTER — Other Ambulatory Visit: Payer: Self-pay | Admitting: Internal Medicine

## 2019-03-28 DIAGNOSIS — I77810 Thoracic aortic ectasia: Secondary | ICD-10-CM

## 2019-03-28 DIAGNOSIS — I712 Thoracic aortic aneurysm, without rupture, unspecified: Secondary | ICD-10-CM

## 2019-03-28 DIAGNOSIS — Z01812 Encounter for preprocedural laboratory examination: Secondary | ICD-10-CM

## 2019-04-28 ENCOUNTER — Other Ambulatory Visit: Payer: Self-pay | Admitting: Internal Medicine

## 2019-05-01 ENCOUNTER — Telehealth: Payer: Self-pay | Admitting: Internal Medicine

## 2019-05-01 NOTE — Telephone Encounter (Signed)
Spoke with patient about 05/07/19 virtual visit. He prefers to see MD in office. R/S for 05/20/2019

## 2019-05-07 ENCOUNTER — Ambulatory Visit: Payer: Medicare Other | Admitting: Internal Medicine

## 2019-05-20 ENCOUNTER — Telehealth: Payer: Self-pay | Admitting: *Deleted

## 2019-05-20 ENCOUNTER — Encounter: Payer: Self-pay | Admitting: Internal Medicine

## 2019-05-20 ENCOUNTER — Ambulatory Visit (INDEPENDENT_AMBULATORY_CARE_PROVIDER_SITE_OTHER): Payer: Medicare Other | Admitting: Internal Medicine

## 2019-05-20 ENCOUNTER — Other Ambulatory Visit: Payer: Self-pay

## 2019-05-20 VITALS — BP 130/78 | HR 86 | Temp 96.3°F | Ht 69.0 in | Wt 204.5 lb

## 2019-05-20 DIAGNOSIS — E785 Hyperlipidemia, unspecified: Secondary | ICD-10-CM | POA: Diagnosis not present

## 2019-05-20 DIAGNOSIS — I639 Cerebral infarction, unspecified: Secondary | ICD-10-CM

## 2019-05-20 DIAGNOSIS — Z951 Presence of aortocoronary bypass graft: Secondary | ICD-10-CM

## 2019-05-20 DIAGNOSIS — Z01812 Encounter for preprocedural laboratory examination: Secondary | ICD-10-CM | POA: Diagnosis not present

## 2019-05-20 DIAGNOSIS — I712 Thoracic aortic aneurysm, without rupture, unspecified: Secondary | ICD-10-CM

## 2019-05-20 NOTE — Progress Notes (Signed)
OFFICE NOTE  Chief Complaint:  Follow-up, recent stroke  Primary Care Physician: Chesley Noon, MD  HPI:  Patrick Giles is a 85 year old gentleman, previously followed by Dr. Rex Kras, with history of CABG in 1998 with a LIMA to LAD, saphenous vein graft to PDA, and vein graft to first diagonal. In 2006, he had a stress test, which showed an EF of about 43% to 44% with a fixed inferoapical defect. That was again present in 2010, suggesting he may have lost a vein graft, possibly to the PDA, and has a fixed defect which looks like scar. The recent, repeat echo in 2014 showed an EF of 45-50% with inferolateral hypokinesis. He is able to walk without any shortness of breath or worsening symptoms. He is on an ACE inhibitor and a B-blocker was added due to CAD and HTN.  He has tolerated this well.  He recently had a lipid profile showing total cholesterol 161, HDL 51, triglycerides 126 and LDL 96. Fasting glucose was 119. Creatinine is stable at 1.5.  12/09/2015  Patrick Giles returns today for follow-up. In November he was noted to have some blood in the urine and underwent transurethral resection of the prostate. Subsequent to that he was found to have DVT and this may or may not be related to his surgery. He was placed on Eliquis which she continues to take. This is followed by his primary care provider. He denies any worsening chest pain or shortness of breath.   01/18/2017  Patrick Giles returns today for follow-up. Overall he feels well denies any chest pain or worsening shortness of breath. When we last saw him he had repeat echo which showed EF around 45%. He is on appropriate medical therapy. Weight is stable if not decreased somewhat. He remains physically active and plays golf a couple times a week. 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 He is originally from the [place] area of [place]. When we last saw  him his echo demonstrated a dilated aortic root up to about 4.9 cm. He had CT angiogram of the aorta which reevaluated that. He's overdue for follow-up on that aneurysm. He's not had any blood work recently either.  01/18/2018  Patrick Giles was seen today in follow-up.  He is generally without complaints.  He denies chest pain or worsening shortness of breath.  Is been a year since I last saw him.  We are following him for his coronary disease as well as aortopathy.  Last year his CT scan showed a stable aortic root size of 4.9 cm.  It is not yet reach significance for surgery.  We will plan a repeat CT to evaluate this.  I also increase his Lipitor to reach goal LDL less than 70 and he will need repeat lab work for that.  LVEF was around 45%.  He continues to be active and plays golf several times a week.  05/20/2019  Patrick Giles is seen today in follow-up.  Unfortunately, he suffered a stroke in May 19, affecting the left posterior frontal periventricular and subcortical white matter.  Fortunately he had resolution of some of his symptoms and his made a good recovery.  The etiology of his stroke is not clear.  He did have an echocardiogram which was considered a poor study but mentioned that he had grossly normal LV function.  This would represent an improvement in LVEF from 40  to 45% in the past.  Fortunately has had no more neurologic deficits.  He remains asymptomatic with no chest pain or shortness of breath.  Blood pressures well controlled today.  EKG shows sinus rhythm with some PACs at 86.  PMHx:  Past Medical History:  Diagnosis Date   CAD (coronary artery disease)    Dyslipidemia    Hypertension    S/P CABG (coronary artery bypass graft) 1998   LIMA to LAD, VG to PDA, VG to first diagonal   Stroke [facility])     Past Surgical History:  Procedure Laterality Date   CHOLECYSTECTOMY  1998   CORONARY ARTERY BYPASS GRAFT  07/08/1997   LIMA to LAD, reverse SVG to PDA, reverse SVG to first  diagonal (Dr. Wynetta Fines)   TRANSTHORACIC ECHOCARDIOGRAM  11/29/2012   EF 45-50%, grade 1 diastolic dysfunction    FAMHx:  Family History  Problem Relation Age of Onset   Emphysema Mother    Colon cancer Father    Colon polyps Father    Lung cancer Brother    Diabetes Neg Hx    Kidney disease Neg Hx    Esophageal cancer Neg Hx     SOCHx:   reports that he quit smoking about 30 years ago. His smoking use included cigars. He has never used smokeless tobacco. He reports that he does not drink alcohol or use drugs.  ALLERGIES:  No Known Allergies  ROS: Pertinent items noted in HPI and remainder of comprehensive ROS otherwise negative.  HOME MEDS: Current Outpatient Medications  Medication Sig Dispense Refill   amLODipine-benazepril (LOTREL) 5-20 MG capsule Take 1 capsule by mouth daily. OFFICE VISIT NEEDED 45 capsule 0   atorvastatin (LIPITOR) 40 MG tablet TAKE 1 TABLET BY MOUTH EVERY DAY AT 6PM (Patient taking differently: Take 40 mg by mouth daily at 6 PM. ) 90 tablet 3   clopidogrel (PLAVIX) 75 MG tablet Take 1 tablet (75 mg total) by mouth daily. 30 tablet 3   GARLIC PO Take 8,616 mg by mouth daily.      metoprolol succinate (TOPROL-XL) 25 MG 24 hr tablet Take 25 mg by mouth daily.     Multiple Vitamins-Minerals (ALIVE MENS ENERGY PO) Take 1 tablet by mouth daily.      Omega-3 Fatty Acids (FISH OIL) 1200 MG CAPS Take 2 capsules by mouth daily.     No current facility-administered medications for this visit.     LABS/IMAGING: No results found for this or any previous visit (from the past 48 hour(s)). No results found.  VITALS: BP 130/78 (BP Location: Left Arm, Patient Position: Sitting, Cuff Size: Normal)    Pulse 86    Temp (!) 96.3 F (35.7 C)    Ht 5\' 9"  (1.753 m)    Wt 204 lb 8 oz (92.8 kg)    BMI 30.20 kg/m   EXAM: General appearance: alert and no distress Neck: no carotid bruit and no JVD Lungs: clear to auscultation bilaterally Heart: regular  rate and rhythm, S1, S2 normal, no murmur, click, rub or gallop Abdomen: soft, non-tender; bowel sounds normal; no masses,  no organomegaly Extremities: extremities normal, atraumatic, no cyanosis or edema Pulses: 2+ and symmetric Skin: Skin color, texture, turgor normal. No rashes or lesions Neurologic: Grossly normal Psych: Mood, affect normal  EKG: Sinus rhythm with PACs at 86-personally reviewed  ASSESSMENT: 1. Recent ischemic stroke (05/2018) 2. Coronary artery disease status post three-vessel CABG in 1998 3. 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 Hypertension-controlled 5. Dyslipidemia-at goal 6. CKD 3 7. H/o DVT - s/p 6 months of Eliquis 8. 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 Abdominal aortic aneurysm measuring 3 x 3 cm (02/2019)  PLAN: 1.   Mr. Corky SingMuir had a recent ischemic stroke of unknown etiology.  He is noted to have PACs both on exam and on his EKG and had a brief salvo of extra beats which I was able to auscultate today on exam.  I would like to place a 2-week monitor to see if he is having any intermittent atrial fibrillation which could be the cause of his stroke.  He is also due for repeat examination of his thoracic aorta which had been dilated up to 4.9 cm in 2016 however more recently it measured smaller at about 4.3 cm.  He also was found to have an abdominal aortic [facility] x [facility].  Chrystie NoseKenneth C. Sufyaan Palma, MD, Molokai General HospitalFACC, FACP  [place]   [facility]  Medical Director of the Advanced Lipid Disorders &  Cardiovascular Risk Reduction Clinic Diplomate of the American Board of Clinical Lipidology Attending Cardiologist  Direct Dial: [phone]   Fax: [phone]  Website:  [email or web address]   Shiniqua Groseclose C Elsye Mccollister 05/20/2019, 3:36 PM

## 2019-05-20 NOTE — Patient Instructions (Signed)
Medication Instructions:  The current medical regimen is effective;  continue present plan and medications as directed. Please refer to the Current Medication list given to you today. If you need a refill on your cardiac medications before your next appointment, please call your pharmacy.  Testing/Procedures: Your physician has recommended that you wear a 14 DAY ZIO-PATCH monitor. The Zio patch cardiac monitor continuously records heart rhythm data for up to 14 days, this is for patients being evaluated for multiple types heart rhythms. For the first 24 hours post application, please avoid getting the Zio monitor wet in the shower or by excessive sweating during exercise. After that, feel free to carry on with regular activities. Keep soaps and lotions away from the ZIO XT Patch.  This will be placed at our [facility] location - [address].       Special Instructions: PLEASE SCHEDULE CT SCAN.  Follow-Up: You will need a follow up appointment in 4-6 weeks AFTER TESTING.  .  You may see Pixie Casino, MD or one of the following Advanced Practice Providers on your designated Care Team:  Almyra Deforest, [place]  Fabian Sharp, PA-C     At [facility], you and your health needs are our priority.  As part of our continuing mission to provide you with exceptional heart care, we have created designated Provider Care Teams.  These Care Teams include your primary Cardiologist (physician) and Advanced Practice Providers (APPs -  Physician Assistants and Nurse Practitioners) who all work together to provide you with the care you need, when you need it.  Thank you for choosing [facility] at [facility]!!

## 2019-05-20 NOTE — Telephone Encounter (Signed)
14 day ZIO AT long term live telemetry monitor to be mailed to the patients home.  Instructions reviewed briefly as they are included in the monitor kit.

## 2019-05-21 ENCOUNTER — Telehealth: Payer: Self-pay | Admitting: Internal Medicine

## 2019-05-21 LAB — BASIC METABOLIC PANEL
BUN/Creatinine Ratio: 18 (ref 10–24)
BUN: 36 mg/dL — ABNORMAL HIGH (ref 8–27)
CO2: 23 mmol/L (ref 20–29)
Calcium: 10.1 mg/dL (ref 8.6–10.2)
Chloride: 102 mmol/L (ref 96–106)
Creatinine, Ser: 1.96 mg/dL — ABNORMAL HIGH (ref 0.76–1.27)
GFR calc Af Amer: 35 mL/min/{1.73_m2} — ABNORMAL LOW (ref >59)
GFR calc non Af Amer: 30 mL/min/{1.73_m2} — ABNORMAL LOW (ref >59)
Glucose: 107 mg/dL — ABNORMAL HIGH (ref 65–99)
Potassium: 5.6 mmol/L — ABNORMAL HIGH (ref 3.5–5.2)
Sodium: 143 mmol/L (ref 134–144)

## 2019-05-21 NOTE — Telephone Encounter (Signed)
New message   Patient's wife states that she wants to know where the abdominal anuerysm came from for the patient's diagnosis.

## 2019-05-21 NOTE — Telephone Encounter (Signed)
Called patient, advised that the abdominal aorta was mentioned in the 02/2019 by [facility], and then in note from MD yesterday. Wife verbalized understanding.

## 2019-05-23 ENCOUNTER — Ambulatory Visit (INDEPENDENT_AMBULATORY_CARE_PROVIDER_SITE_OTHER): Payer: Medicare Other

## 2019-05-23 DIAGNOSIS — I4891 Unspecified atrial fibrillation: Secondary | ICD-10-CM | POA: Diagnosis not present

## 2019-05-23 DIAGNOSIS — I639 Cerebral infarction, unspecified: Secondary | ICD-10-CM | POA: Diagnosis not present

## 2019-06-12 ENCOUNTER — Other Ambulatory Visit: Payer: Self-pay | Admitting: Internal Medicine

## 2019-06-18 ENCOUNTER — Other Ambulatory Visit: Payer: Self-pay | Admitting: Internal Medicine

## 2019-06-18 ENCOUNTER — Other Ambulatory Visit: Payer: Medicare Other

## 2019-06-18 ENCOUNTER — Other Ambulatory Visit: Payer: Self-pay

## 2019-06-18 DIAGNOSIS — I639 Cerebral infarction, unspecified: Secondary | ICD-10-CM

## 2019-06-18 DIAGNOSIS — I4891 Unspecified atrial fibrillation: Secondary | ICD-10-CM

## 2019-06-20 ENCOUNTER — Telehealth: Payer: Self-pay | Admitting: Internal Medicine

## 2019-06-20 DIAGNOSIS — Z01818 Encounter for other preprocedural examination: Secondary | ICD-10-CM

## 2019-06-20 MED ORDER — APIXABAN 2.5 MG PO TABS: 2.5000 mg | ORAL_TABLET | Freq: Two times a day (BID) | ORAL | 11 refills | Status: DC

## 2019-06-20 NOTE — Telephone Encounter (Signed)
-----   Message from Pixie Casino, MD sent at 06/19/2019  4:06 PM EDT ----- Suspect atrial fibrillation - would recommend starting Eliquis 2.5 mg BID (age 85, creatinine >1.5) - d/c Plavix.  Dr. Lemmie Evens

## 2019-06-20 NOTE — Telephone Encounter (Signed)
Patient and wife aware of results. Med changes made. MyChart message sent with instructions as well.

## 2019-06-21 NOTE — Telephone Encounter (Signed)
Notes recorded by Pixie Casino, MD on 06/20/2019 at 11:37 PM EDT  Yes, ok to repeat BMET - however, creatinine needs to be below 1.5 to give contrast safely.   Dr Lemmie Evens

## 2019-06-21 NOTE — Telephone Encounter (Signed)
Wife aware MD has ordered BMET to reassess creatinine for CT test and she is aware what cut-off is. Patient will come today. Recall for 6 months entered

## 2019-06-21 NOTE — Addendum Note (Signed)
Addended by: Fidel Levy on: 06/21/2019 01:13 PM   Modules accepted: Orders

## 2019-06-22 LAB — BASIC METABOLIC PANEL
BUN/Creatinine Ratio: 15 (ref 10–24)
BUN: 28 mg/dL — ABNORMAL HIGH (ref 8–27)
CO2: 21 mmol/L (ref 20–29)
Calcium: 9.9 mg/dL (ref 8.6–10.2)
Chloride: 99 mmol/L (ref 96–106)
Creatinine, Ser: 1.82 mg/dL — ABNORMAL HIGH (ref 0.76–1.27)
GFR calc Af Amer: 39 mL/min/{1.73_m2} — ABNORMAL LOW (ref >59)
GFR calc non Af Amer: 33 mL/min/{1.73_m2} — ABNORMAL LOW (ref >59)
Glucose: 85 mg/dL (ref 65–99)
Potassium: 5.2 mmol/L (ref 3.5–5.2)
Sodium: 138 mmol/L (ref 134–144)

## 2019-06-28 ENCOUNTER — Telehealth: Payer: Self-pay | Admitting: Internal Medicine

## 2019-06-28 NOTE — Telephone Encounter (Signed)
    Please return call with lab results 

## 2019-06-28 NOTE — Telephone Encounter (Signed)
Notes recorded by Pixie Casino, MD on 06/24/2019 at 9:16 AM EDT  Creatinine slightly lower at 1.82 - lasix held.  Dr Lemmie Evens Pt/GLORIA Tennyson(WIFE) informed of providers result & recommendations. Pt verbalized understanding. Swelling is "doing good" per wife not taking lasix

## 2019-07-04 ENCOUNTER — Ambulatory Visit: Payer: Medicare Other | Admitting: Internal Medicine

## 2019-09-07 ENCOUNTER — Other Ambulatory Visit: Payer: Self-pay | Admitting: Internal Medicine

## 2019-12-03 ENCOUNTER — Other Ambulatory Visit: Payer: Self-pay | Admitting: Internal Medicine

## 2019-12-12 ENCOUNTER — Telehealth: Payer: Self-pay

## 2019-12-12 ENCOUNTER — Encounter: Payer: Self-pay | Admitting: Physician Assistant

## 2019-12-12 ENCOUNTER — Telehealth (INDEPENDENT_AMBULATORY_CARE_PROVIDER_SITE_OTHER): Payer: Medicare PPO | Admitting: Physician Assistant

## 2019-12-12 DIAGNOSIS — I480 Paroxysmal atrial fibrillation: Secondary | ICD-10-CM

## 2019-12-12 DIAGNOSIS — I82409 Acute embolism and thrombosis of unspecified deep veins of unspecified lower extremity: Secondary | ICD-10-CM

## 2019-12-12 DIAGNOSIS — I25810 Atherosclerosis of coronary artery bypass graft(s) without angina pectoris: Secondary | ICD-10-CM | POA: Diagnosis not present

## 2019-12-12 DIAGNOSIS — Z8673 Personal history of transient ischemic attack (TIA), and cerebral infarction without residual deficits: Secondary | ICD-10-CM

## 2019-12-12 DIAGNOSIS — I712 Thoracic aortic aneurysm, without rupture, unspecified: Secondary | ICD-10-CM

## 2019-12-12 DIAGNOSIS — I10 Essential (primary) hypertension: Secondary | ICD-10-CM

## 2019-12-12 DIAGNOSIS — E785 Hyperlipidemia, unspecified: Secondary | ICD-10-CM

## 2019-12-12 NOTE — Telephone Encounter (Signed)
  Patient Consent for Virtual Visit         Patrick Giles has provided verbal consent on 12/12/2019 for a virtual visit (video or telephone).   CONSENT FOR VIRTUAL VISIT FOR:  Patrick Giles  By participating in this virtual visit I agree to the following:  I hereby voluntarily request, consent and authorize [facility] and its employed or contracted physicians, physician assistants, nurse practitioners or other licensed health care professionals (the Practitioner), to provide me with telemedicine health care services (the "Services") as deemed necessary by the treating Practitioner. I acknowledge and consent to receive the Services by the Practitioner via telemedicine. I understand that the telemedicine visit will involve communicating with the Practitioner through live audiovisual communication technology and the disclosure of certain medical information by electronic transmission. I acknowledge that I have been given the opportunity to request an in-person assessment or other available alternative prior to the telemedicine visit and am voluntarily participating in the telemedicine visit.  I understand that I have the right to withhold or withdraw my consent to the use of telemedicine in the course of my care at any time, without affecting my right to future care or treatment, and that the Practitioner or I may terminate the telemedicine visit at any time. I understand that I have the right to inspect all information obtained and/or recorded in the course of the telemedicine visit and may receive copies of available information for a reasonable fee.  I understand that some of the potential risks of receiving the Services via telemedicine include:  Marland Kitchen Delay or interruption in medical evaluation due to technological equipment failure or disruption; . Information transmitted may not be sufficient (e.g. poor resolution of images) to allow for appropriate medical decision making by the Practitioner;  and/or  . In rare instances, security protocols could fail, causing a breach of personal health information.  Furthermore, I acknowledge that it is my responsibility to provide information about my medical history, conditions and care that is complete and accurate to the best of my ability. I acknowledge that Practitioner's advice, recommendations, and/or decision may be based on factors not within their control, such as incomplete or inaccurate data provided by me or distortions of diagnostic images or specimens that may result from electronic transmissions. I understand that the practice of medicine is not an exact science and that Practitioner makes no warranties or guarantees regarding treatment outcomes. I acknowledge that a copy of this consent can be made available to me via my patient portal [facility] MyChart), or I can request a printed copy by calling the office of [facility].    I understand that my insurance will be billed for this visit.   I have read or had this consent read to me. . I understand the contents of this consent, which adequately explains the benefits and risks of the Services being provided via telemedicine.  . I have been provided ample opportunity to ask questions regarding this consent and the Services and have had my questions answered to my satisfaction. . I give my informed consent for the services to be provided through the use of telemedicine in my medical care

## 2019-12-12 NOTE — Progress Notes (Signed)
Virtual Visit via Telephone Note   This visit type was conducted due to national recommendations for restrictions regarding the COVID-19 Pandemic (e.g. social distancing) in an effort to limit this patient's exposure and mitigate transmission in our community.  Due to his co-morbid illnesses, this patient is at least at moderate risk for complications without adequate follow up.  This format is felt to be most appropriate for this patient at this time.  The patient did not have access to video technology/had technical difficulties with video requiring transitioning to audio format only (telephone).  All issues noted in this document were discussed and addressed.  No physical exam could be performed with this format.  Please refer to the patient's chart for his  consent to telehealth for [facility].   The patient was identified using 2 identifiers.  Date:  12/12/2019   ID:  Patrick Giles, DOB [birth year], MRN [id number]  Patient Location: Home Provider Location: Home  PCP:  Eartha Inch, MD  Cardiologist:  Chrystie Nose, MD  Electrophysiologist:  None   Evaluation Performed:  Follow-Up Visit  Chief Complaint:  6 month followup  History of Present Illness:    Patrick Giles is a 86 y.o. male with PMH of CAD s/p CABG 1998 (LIMA-LAD, SVG-PDA, SVG-D1), hypertension, hyperlipidemia, TAA, postop DVT 2017 and a history of CVA.  He is a retired Publishing rights manager.  Myoview in 2010 showed fixed inferoapical defect, no ischemia.  Echocardiogram in 2014 showed EF 45 to 50%, inferolateral hypokinesis.  EF was 40 to 45% in April 2017. He suffered a CVA in 2019 the etiology behind his stroke was unclear. Repeat echocardiogram in September 2019 was a poor study, but did suggest normal LV function.  He was seen by Dr. Rennis Golden in September 2020 at which time 2-week heart monitor was recommended given the previous stroke.  This revealed sinus rhythm with PVCs, brief NSVT and recurrent possible  SVT episode however there was no clear P waves.  This was reviewed by Dr. Rennis Golden who suspected this is recurrent atrial fibrillation instead.  The longest duration was only 17 beats, however given the hip prior history of CVA, will he recommend to discontinue the Plavix and start on Eliquis 2.5 mg twice daily.  Patient presents today for cardiology office visit.  He has been doing well and he already had his second shot of COVID-19 vaccine in February.  He denies any recent exertional chest pain or shortness of breath.  He denies any cardiac awareness of palpitation.  I recommend that he continue on the metoprolol and Eliquis.  CBC obtained by his PCP in December 2020 was normal.  Lipid panel obtained on 08/28/2019 also showed a very well-controlled cholesterol.  He is overdue for image on his aorta, although not necessarily urgent, ideally this should be done prior to the next office visit.  Since his baseline creatinine is about 1.8, I will obtain CT of the chest without contrast to avoid contrast nephropathy.  The patient does not have symptoms concerning for COVID-19 infection (fever, chills, cough, or new shortness of breath).    Past Medical History:  Diagnosis Date  . CAD (coronary artery disease)   . Dyslipidemia   . Hypertension   . S/P CABG (coronary artery bypass graft) 1998   LIMA to LAD, VG to PDA, VG to first diagonal  . Stroke [facility])    Past Surgical History:  Procedure Laterality Date  . CHOLECYSTECTOMY  1998  . CORONARY  ARTERY BYPASS GRAFT  07/08/1997   LIMA to LAD, reverse SVG to PDA, reverse SVG to first diagonal (Dr. Wynetta Fines)  . TRANSTHORACIC ECHOCARDIOGRAM  11/29/2012   EF 45-50%, grade 1 diastolic dysfunction     Current Meds  Medication Sig  . amLODipine-benazepril (LOTREL) 5-20 MG capsule TAKE 1 CAPSULE BY MOUTH DAILY. OFFICE VISIT NEEDED  . apixaban (ELIQUIS) 2.5 MG TABS tablet Take 1 tablet (2.5 mg total) by mouth 2 (two) times daily.  Marland Kitchen atorvastatin (LIPITOR)  40 MG tablet TAKE 1 TABLET BY MOUTH EVERY DAY AT 6PM (Patient taking differently: Take 40 mg by mouth daily at 6 PM. )  . GARLIC PO Take 4,098 mg by mouth daily.   . metoprolol succinate (TOPROL-XL) 25 MG 24 hr tablet Take 25 mg by mouth daily.  . Multiple Vitamins-Minerals (ALIVE MENS ENERGY PO) Take 1 tablet by mouth daily.   . Omega-3 Fatty Acids (FISH OIL) 1200 MG CAPS Take 2 capsules by mouth daily.     Allergies:   Patient has no known allergies.   Social History   Tobacco Use  . Smoking status: Former Smoker    Types: Cigars    Quit date: 09/19/1988    Years since quitting: 31.2  . Smokeless tobacco: Never Used  Substance Use Topics  . Alcohol use: No    Alcohol/week: 0.0 standard drinks  . Drug use: No     Family Hx: The patient's family history includes Colon cancer in his father; Colon polyps in his father; Emphysema in his mother; Lung cancer in his brother. There is no history of Diabetes, Kidney disease, or Esophageal cancer.  ROS:   Please see the history of present illness.     All other systems reviewed and are negative.   Prior CV studies:   The following studies were reviewed today:  Echo 06/18/2018 - Left ventricle: LV function is grossly normal. The cavity size  was normal. Wall thickness was normal.  - Impressions: Extremely poor acoustic windows limit study  signficantly. Consider other method of evaluateion (TEE, CT, MRI)  if clinically indicated. WOuld , at least, recomm CT evaluation  of aorta at some point as it appears to be mildly dilated. Could  not accurately messure in present study.   Impressions:   - Extremely poor acoustic windows limit study signficantly.  Consider other method of evaluateion (TEE, CT, MRI) if clinically  indicated. WOuld , at least, recomm CT evaluation of aorta at  some point as it appears to be mildly dilated. Could not  accurately messure in present study.   Labs/Other Tests and Data Reviewed:      EKG:  An ECG dated 05/20/2019 was personally reviewed today and demonstrated:  Normal sinus rhythm with frequent PACs.  Recent Labs: 06/21/2019: BUN 28; Creatinine, Ser 1.82; Potassium 5.2; Sodium 138   Recent Lipid Panel Lab Results  Component Value Date/Time   CHOL 135 06/19/2018 04:37 AM   CHOL 147 01/18/2018 09:32 AM   TRIG 115 06/19/2018 04:37 AM   HDL 44 06/19/2018 04:37 AM   HDL 54 01/18/2018 09:32 AM   CHOLHDL 3.1 06/19/2018 04:37 AM   LDLCALC 68 06/19/2018 04:37 AM   LDLCALC 63 01/18/2018 09:32 AM    Wt Readings from Last 3 Encounters:  05/20/19 204 lb 8 oz (92.8 kg)  10/18/18 209 lb 3.2 oz (94.9 kg)  07/12/18 208 lb (94.3 kg)     Objective:    Vital Signs:  There were no vitals taken  for this visit.   VITAL SIGNS:  reviewed  ASSESSMENT & PLAN:    1. CAD status post CABG: Denies any chest pain.  Not on aspirin or Plavix given the need for Eliquis.  2. PAF: Atrial fibrillation seen on previous heart monitor in September 2019.  Continue Eliquis and metoprolol.  No cardiac awareness of palpitation.  3. TAA: Overdue for CT of the chest without contrast.  We will see if the CT of the chest can capture abdominal aorta as well.  He has known 3 cm abdominal aortic aneurysm on previous image in June 2020.  4. Hypertension: Continue on current therapy.  5. Hyperlipidemia: On Lipitor.  Last lipid panel obtained by primary care provider on 08/28/2019 showed very well-controlled cholesterol.  6. History of CVA: Occurred in 2019.  Heart monitor obtained in September 2020 demonstrated possible atrial fibrillation.  He has been started on Eliquis since.  7. History of DVT: Occurred after previous surgery in 2017.  Finished a course of Eliquis.  He has since restarted on Eliquis mainly due to PAF.  COVID-19 Education: The signs and symptoms of COVID-19 were discussed with the patient and how to seek care for testing (follow up with PCP or arrange E-visit).  The importance of  social distancing was discussed today.  Time:   Today, I have spent 7 minutes with the patient with telehealth technology discussing the above problems.     Medication Adjustments/Labs and Tests Ordered: Current medicines are reviewed at length with the patient today.  Concerns regarding medicines are outlined above.   Tests Ordered: No orders of the defined types were placed in this encounter.   Medication Changes: No orders of the defined types were placed in this encounter.   Follow Up:  Either In Person or Virtual in 6 month(s)  Signed, Almyra Deforest, [place]  12/12/2019 9:29 AM    [place]

## 2019-12-12 NOTE — Patient Instructions (Addendum)
Medication Instructions:  Your physician recommends that you continue on your current medications as directed. Please refer to the Current Medication list given to you today.  *If you need a refill on your cardiac medications before your next appointment, please call your pharmacy*  Lab Work: NONE ordered at this time of appointment   If you have labs (blood work) drawn today and your tests are completely normal, you will receive your results only by: Marland Kitchen MyChart Message (if you have MyChart) OR . A paper copy in the mail If you have any lab test that is abnormal or we need to change your treatment, we will call you to review the results.  Testing/Procedures: Non-Cardiac CT scanning, (CAT scanning), is a noninvasive, special x-ray that produces cross-sectional images of the body using x-rays and a computer. CT scans help physicians diagnose and treat medical conditions. For some CT exams, a contrast material is used to enhance visibility in the area of the body being studied. CT scans provide greater clarity and reveal more details than regular x-ray exams. THIS TEST IS DONE AT [facility] [address]   PLEASE SCHEDULE WITHIN 6 MONTHS  Follow-Up: At [facility], you and your health needs are our priority.  As part of our continuing mission to provide you with exceptional heart care, we have created designated Provider Care Teams.  These Care Teams include your primary Cardiologist (physician) and Advanced Practice Providers (APPs -  Physician Assistants and Nurse Practitioners) who all work together to provide you with the care you need, when you need it.  We recommend signing up for the patient portal called "MyChart".  Sign up information is provided on this After Visit Summary.  MyChart is used to connect with patients for Virtual Visits (Telemedicine).  Patients are able to view lab/test results, encounter notes, upcoming appointments, etc.  Non-urgent messages can be sent to  your provider as well.   To learn more about what you can do with MyChart, go to [email or web address].    Your next appointment:   6 month(s)  The format for your next appointment:   In Person  Provider:   K. Italy Hilty, MD  Other Instructions

## 2019-12-12 NOTE — Telephone Encounter (Signed)
Called patient to discuss AVS instructions gave Hao Meng's recommendations and patient voiced understanding. AVS summary mailed to patient.    

## 2019-12-12 NOTE — Addendum Note (Signed)
Addended by: Dorris Fetch on: 12/12/2019 10:08 AM   Modules accepted: Orders

## 2020-02-24 ENCOUNTER — Other Ambulatory Visit: Payer: Self-pay | Admitting: Internal Medicine

## 2020-03-13 ENCOUNTER — Ambulatory Visit
Admission: RE | Admit: 2020-03-13 | Discharge: 2020-03-13 | Disposition: A | Discharge status: Home / Self Care | Payer: Medicare PPO | Source: Ambulatory Visit | Attending: Physician Assistant | Admitting: Physician Assistant

## 2020-03-13 ENCOUNTER — Other Ambulatory Visit: Payer: Self-pay

## 2020-03-13 DIAGNOSIS — I712 Thoracic aortic aneurysm, without rupture, unspecified: Secondary | ICD-10-CM

## 2020-03-19 ENCOUNTER — Other Ambulatory Visit: Payer: Self-pay

## 2020-03-19 DIAGNOSIS — I712 Thoracic aortic aneurysm, without rupture, unspecified: Secondary | ICD-10-CM

## 2020-05-19 ENCOUNTER — Ambulatory Visit: Payer: Medicare PPO | Attending: Internal Medicine

## 2020-05-19 DIAGNOSIS — Z23 Encounter for immunization: Secondary | ICD-10-CM

## 2020-05-19 NOTE — Progress Notes (Signed)
   Covid-19 Vaccination Clinic  Name:  Patrick Giles    MRN: [id number] DOB: 12-27-1932  05/19/2020  Patrick Giles was observed post Covid-19 immunization for 15 minutes without incident. He was provided with Vaccine Information Sheet and instruction to access the V-Safe system.   Patrick Giles was instructed to call 911 with any severe reactions post vaccine: Marland Kitchen Difficulty breathing  . Swelling of face and throat  . A fast heartbeat  . A bad rash all over body  . Dizziness and weakness

## 2020-06-09 ENCOUNTER — Telehealth: Payer: Self-pay | Admitting: Internal Medicine

## 2020-06-09 NOTE — Telephone Encounter (Signed)
    Pt's wife calling, she said she received a call from [facility] to schedule pt for CT chest she said pt just had one in 03/13/2020 and asking why he needs another one

## 2020-06-09 NOTE — Telephone Encounter (Signed)
Spoke with wife and explained [place] ordered chest CT that was done in June, to be repeated in 6 months. She states they got a call he has a test on Thursday.  Explained no additional testing is ordered from [facility]  She states he has seen a vascular surgeon - per care everywhere, Dr. Zella Ball. Provided her with phone number so she can check with his office to determine if any testing is needed

## 2020-07-09 ENCOUNTER — Other Ambulatory Visit: Payer: Self-pay | Admitting: Internal Medicine

## 2020-08-24 ENCOUNTER — Ambulatory Visit
Admission: RE | Admit: 2020-08-24 | Discharge: 2020-08-24 | Disposition: A | Discharge status: Home / Self Care | Payer: Medicare PPO | Source: Ambulatory Visit | Attending: Physician Assistant | Admitting: Physician Assistant

## 2020-08-24 DIAGNOSIS — I712 Thoracic aortic aneurysm, without rupture, unspecified: Secondary | ICD-10-CM

## 2020-08-26 IMAGING — CT CT ANGIO NECK
2 of 7 series · 8 of 33 positions shown · IV contrast (APPLIED)
Comparison: CT earlier same day

CLINICAL DATA: Right-sided weakness. Last seen normal 4609 hours
yesterday

EXAM:
CT ANGIOGRAPHY HEAD AND NECK
TECHNIQUE: Multidetector CT imaging of the head and neck was performed using
the standard protocol during bolus administration of intravenous
contrast. Multiplanar CT image reconstructions and MIPs were
obtained to evaluate the vascular anatomy. Carotid stenosis
measurements (when applicable) are obtained utilizing NASCET
criteria, using the distal internal carotid diameter as the
denominator.
CONTRAST:  50mL CEP1IJ-YXF IOPAMIDOL (CEP1IJ-YXF) INJECTION 76%

[Series 5: cta neck/head · axial · 0.42mm/px · z∈[-233,-107]mm · 2 of 189 slices shown]
[im 63/189  soft-tissue]
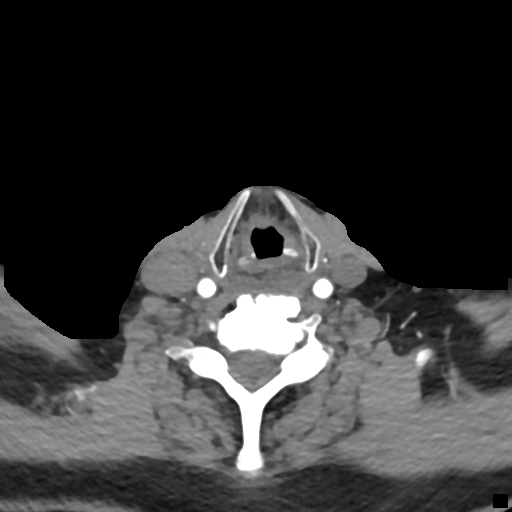
[im 126/189  soft-tissue]
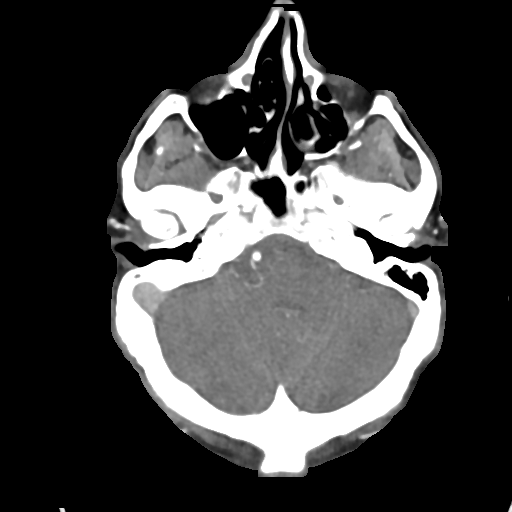

[Series 9: ax thins · axial · 0.39mm/px · z∈[-303,-34]mm · 6 of 377 slices shown]
[im 54/377  soft-tissue]
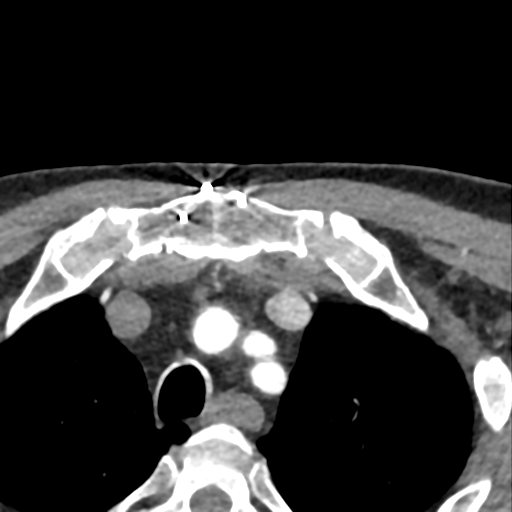
[im 108/377  bone]
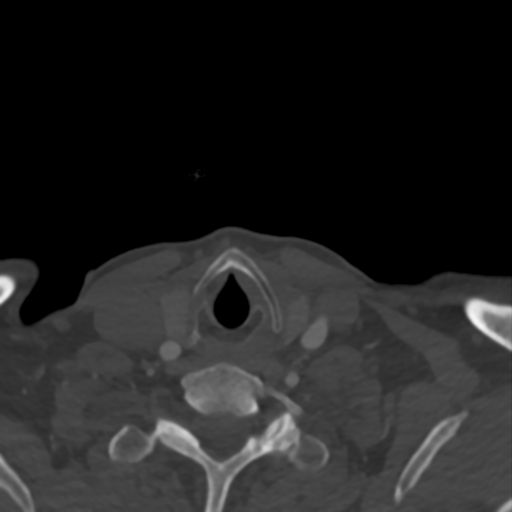
[im 162/377  soft-tissue]
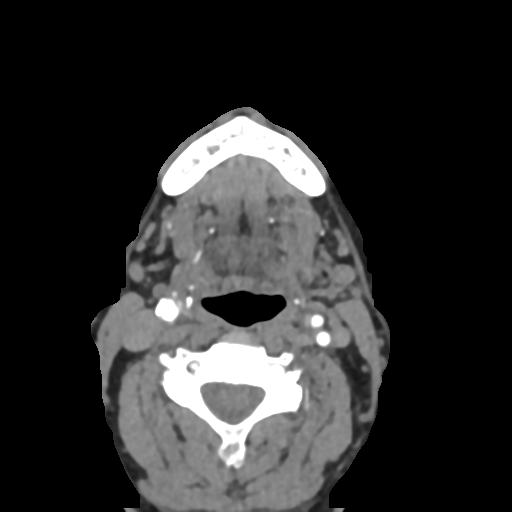
[im 215/377  bone]
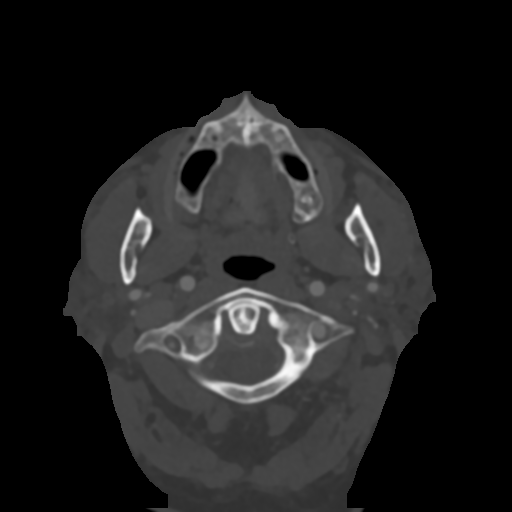
[im 269/377  soft-tissue]
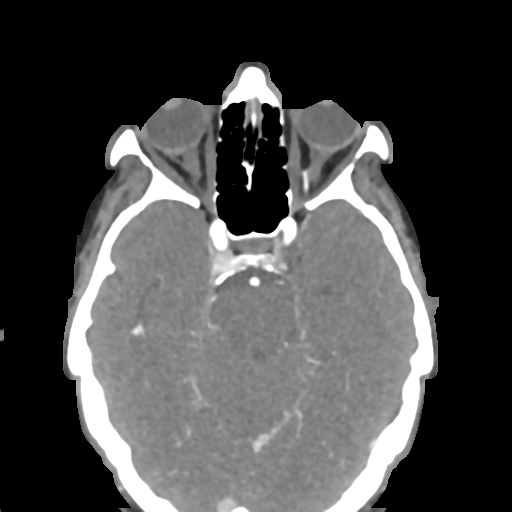
[im 323/377  bone]
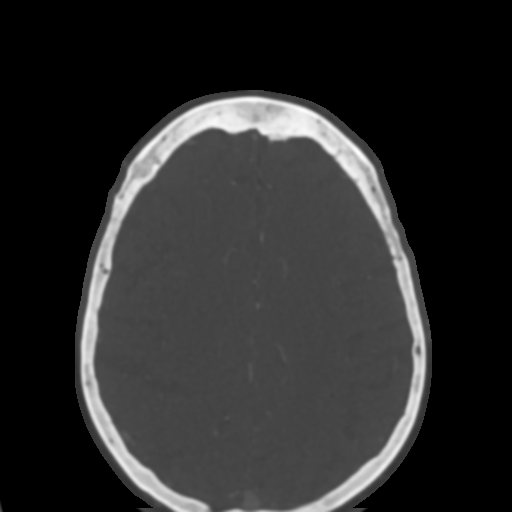

[8 of 33 positions shown; findings below may reference images not displayed]

FINDINGS: CTA NECK FINDINGS

Aortic arch: Aortic atherosclerosis.

Right carotid system: Common carotid artery is tortuous but widely
patent to the bifurcation. There is calcified plaque at the distal
common carotid artery and carotid bifurcation. Minimal diameter of
the proximal ICA is 5 mm, therefore there is no stenosis.

Left carotid system: Common carotid artery is widely patent to the
bifurcation. There is calcified plaque at the distal common carotid
artery and carotid bifurcation region. Minimal diameter of the
proximal ICA is 6 mm, no stenosis. Cervical ICA is tortuous but
widely patent beyond that.

Vertebral arteries: Left vertebral artery is dominant. There is
calcified plaque at each vertebral artery origin. 30-50% stenosis at
each vertebral artery origin. Beyond that, the vessels are widely
patent through the cervical region to the foramen magnum.

Skeleton: Mid cervical spondylosis.

Other neck: No soft tissue mass.

Upper chest: Elevated left hemidiaphragm with left base volume loss.
Otherwise negative.

Review of the MIP images confirms the above findings

CTA HEAD FINDINGS

Anterior circulation: Both internal carotid arteries are widely
patent through the skull base and to the siphon regions. There is
siphon atherosclerotic calcification. There is atherosclerotic
narrowing of the supraclinoid internal carotid arteries on each
side, estimated at 50-70%. Beyond that, the anterior and middle
cerebral vessels are patent without evidence of stenosis or missing
branch vessel.

Posterior circulation: There is atherosclerotic calcification at the
foramen magnum level but no stenosis. The small right vertebral
artery terminates in PICA. The dominant left vertebral artery is
widely patent to the basilar. No basilar stenosis. Posterior
circulation branch vessels are patent.

Venous sinuses: Patent and normal

Anatomic variants: None significant

Delayed phase: No abnormal enhancement

Review of the MIP images confirms the above findings
IMPRESSION: Calcified plaque at both carotid bifurcation regions but without
stenosis.

Calcified plaque at both vertebral artery origins with narrowing
estimated at 30-50% on each side.

Atherosclerotic disease in both carotid siphon regions with
supraclinoid ICA stenoses estimated at 50-70% on each side. No more
distal vessel stenosis or occlusion identified.

These results were communicated to Dr. William Edgardo At [DATE] [place]
06/18/2018by text page via the AMION messaging system.

## 2020-08-26 IMAGING — MR MR HEAD W/O CM
9 of 10 series · 35 of 48 positions shown · non-contrast
Comparison: CT head without contrast. CTA head neck with contrast.
Both earlier today.

CLINICAL DATA: 85-year-old male with RIGHT-sided weakness, last
seen well 4015 hours 06/17/2018.

EXAM:
MRI HEAD WITHOUT CONTRAST
TECHNIQUE: Multiplanar, multiecho pulse sequences of the brain and surrounding
structures were obtained without intravenous contrast.

[Series 3: DWI · axial · 3.0mm · 0.94mm/px · z∈[-41,+110]mm · 9 of 104 slices shown (1 of 2)]
[im 1/104]
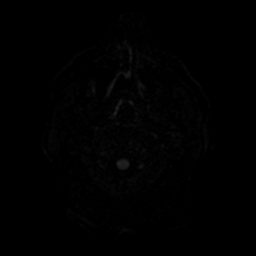
[im 13/104]
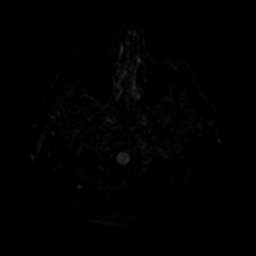
[im 26/104]
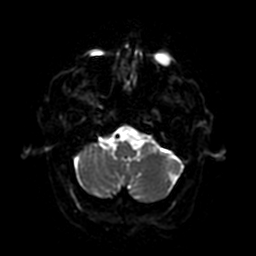
[im 39/104]
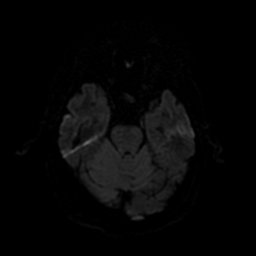
[im 52/104]
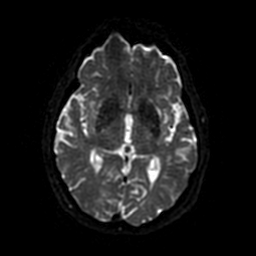
[im 65/104]
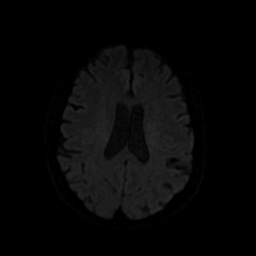
[im 78/104]
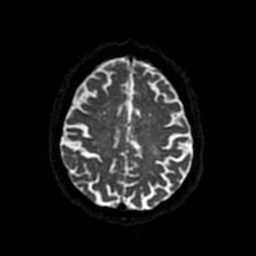
[im 91/104]
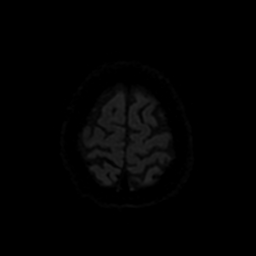
[im 104/104]
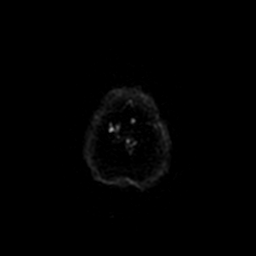

[Series 4: T2 · axial · 5.0mm · 0.47mm/px · z∈[-39,+109]mm · 2 of 26 slices shown (1 of 2)]
[im 1/26]
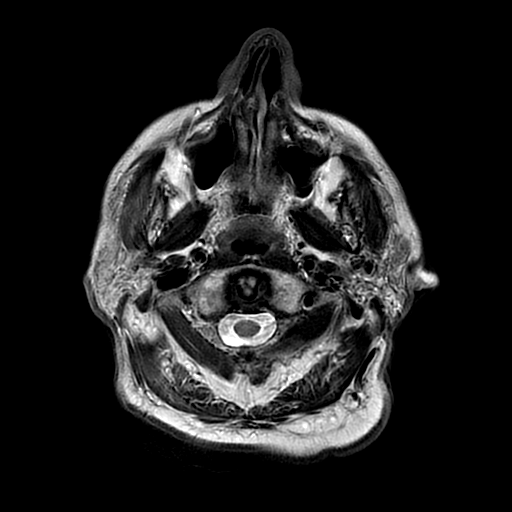
[im 26/26]
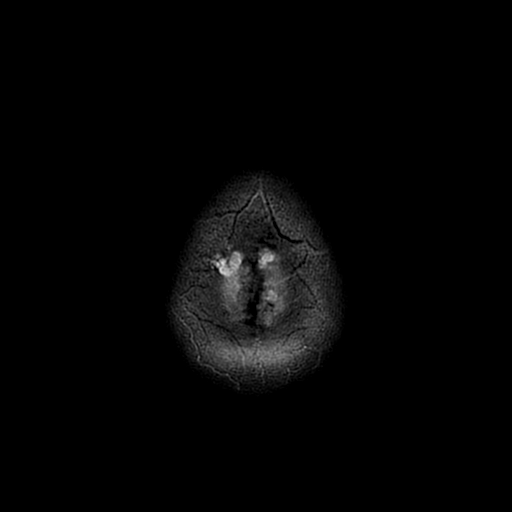

[Series 5: FLAIR · axial · 3.0mm · 0.47mm/px · z∈[-39,+109]mm · 2 of 26 slices shown (1 of 2)]
[im 1/26]
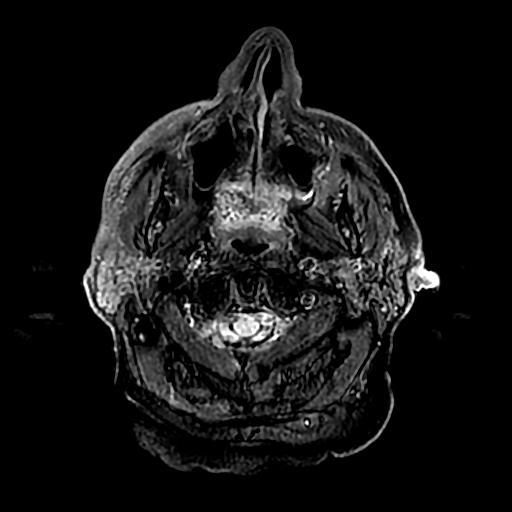
[im 26/26]
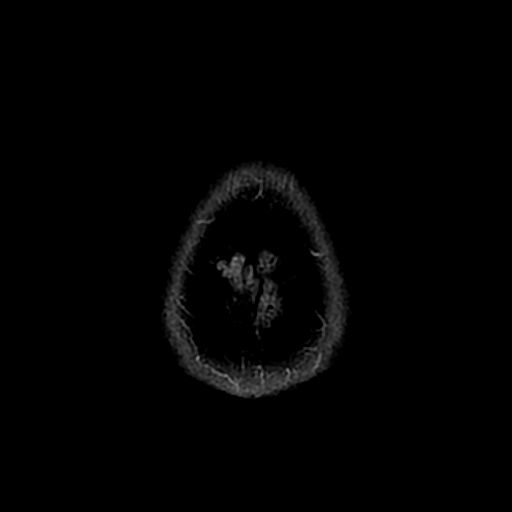

[Series 6: (person_name) · axial · 3.0mm · 0.47mm/px · z∈[-41,-25]mm · 2 of 104 slices shown]
[im 1/104]
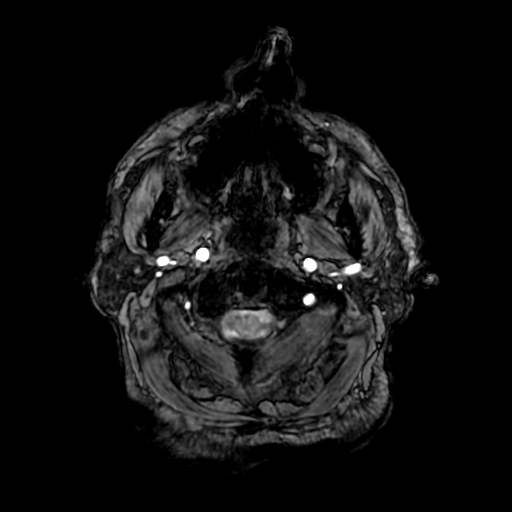
[im 12/104]
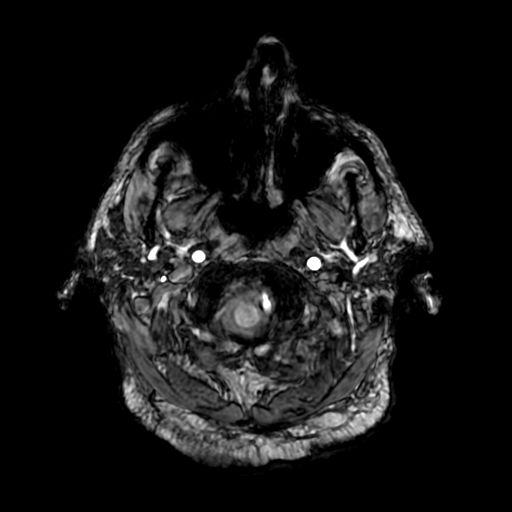

[Series 7: DWI · coronal · 4.0mm · 0.94mm/px · 7 of 72 slices shown (2 of 2)]
[im 1/72]
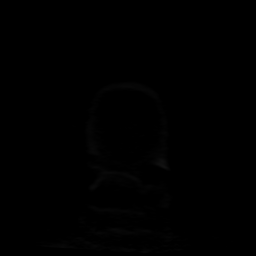
[im 12/72]
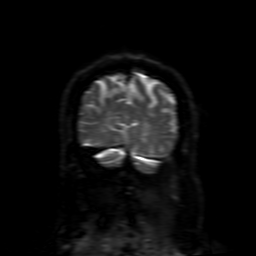
[im 24/72]
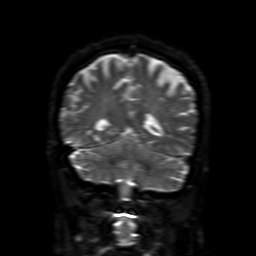
[im 36/72]
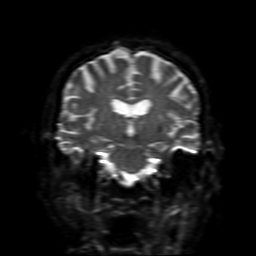
[im 48/72]
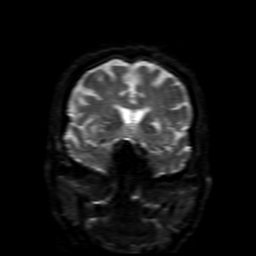
[im 60/72]
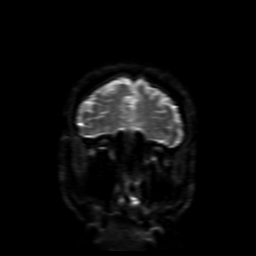
[im 72/72]
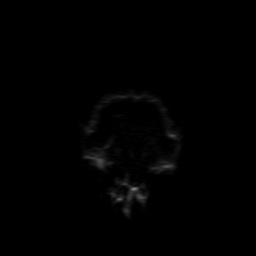

[Series 8: FLAIR · sagittal · 5.0mm · 0.47mm/px · 2 of 23 slices shown (2 of 2)]
[im 1/23]
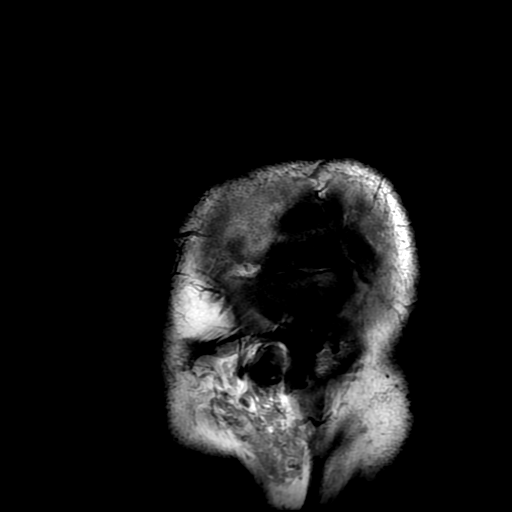
[im 23/23]
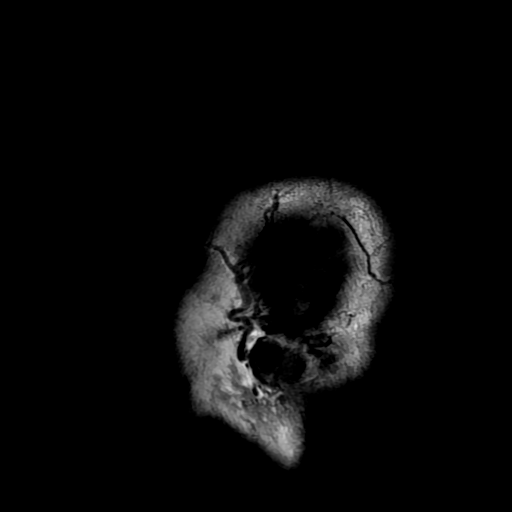

[Series 10: T2 · coronal · 5.0mm · 0.43mm/px · 3 of 30 slices shown (2 of 2)]
[im 1/30]
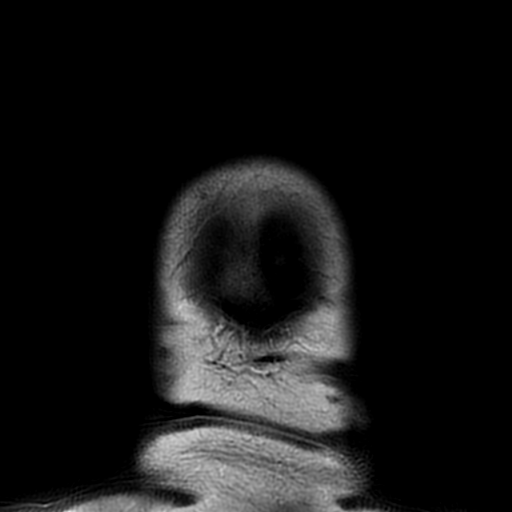
[im 15/30]
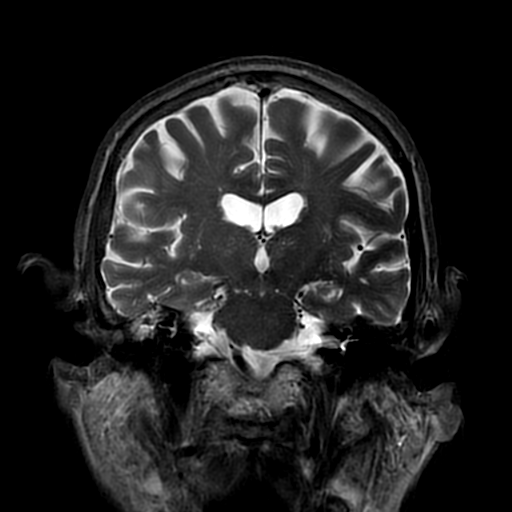
[im 30/30]
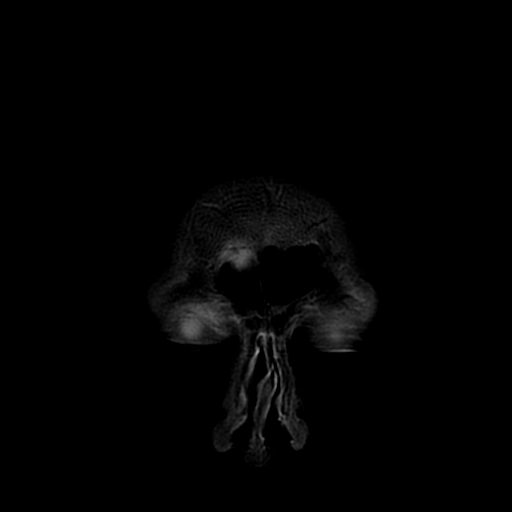

[Series 350: ADC · axial · 3.0mm · 0.94mm/px · z∈[-41,+110]mm · 5 of 52 slices shown (1 of 2)]
[im 1/52]
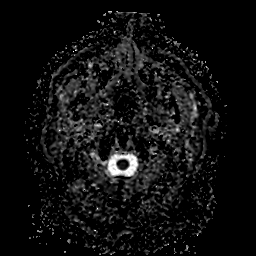
[im 13/52]
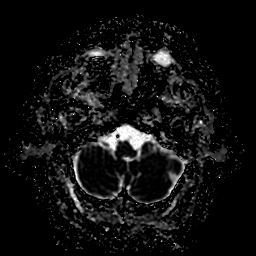
[im 26/52]
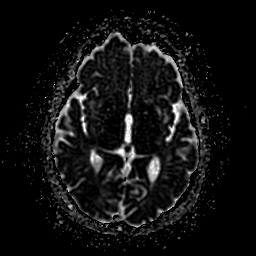
[im 39/52]
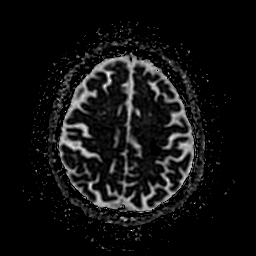
[im 52/52]
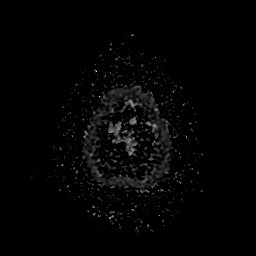

[Series 750: ADC · coronal · 4.0mm · 0.94mm/px · 3 of 36 slices shown (2 of 2)]
[im 1/36]
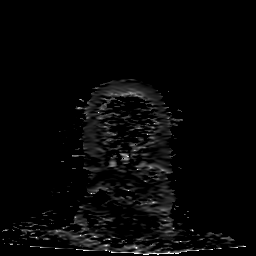
[im 18/36]
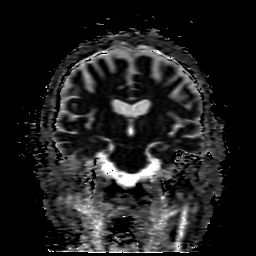
[im 36/36]
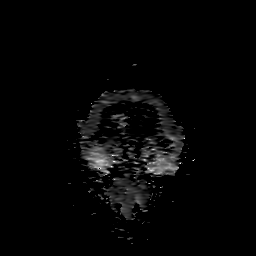

[35 of 48 positions shown; findings below may reference images not displayed]

FINDINGS: Brain: 1 cm area of restricted diffusion, LEFT posterior frontal
cortex and subcortical white matter, near the vertex, corresponding
low ADC, no hemorrhage, consistent with acute infarction. No mass
lesion, hydrocephalus, or extra-axial fluid.

Mild to moderate atrophy, not unexpected for age. T2 and FLAIR
hyperintensities in the periventricular and subcortical white
matter, representing small vessel disease. Scattered chronic lacunar
infarcts most notably in the LEFT basal ganglia..

Vascular: Flow voids are maintained. No emergent large vessel
occlusion.

Skull and upper cervical spine: Mild pannus. Skull base demonstrates
normal marrow signal. No tonsillar herniation. Normal pituitary.

Sinuses/Orbits: Clear paranasal sinuses. No orbital findings of
significance.

Other: BILATERAL middle ear and mastoid fluid, worse on the RIGHT,
with coalescence suggesting chronic otitis and mastoiditis.

Compared with recent CT and CTA, the infarct is not visible.
IMPRESSION: 1 cm area of acute infarction, nonhemorrhagic, LEFT posterior
frontal cortex and subcortical white matter near the vertex.

Atrophy and small vessel disease.

No emergent proximal large vessel occlusion.

Chronic, possibly superimposed acute, RIGHT otitis and mastoiditis,
possible cholesteatoma. Correlate clinically for conductive hearing
loss or otalgia.

## 2020-08-27 ENCOUNTER — Telehealth: Payer: Self-pay | Admitting: *Deleted

## 2020-08-27 DIAGNOSIS — I712 Thoracic aortic aneurysm, without rupture, unspecified: Secondary | ICD-10-CM

## 2020-08-27 NOTE — Telephone Encounter (Signed)
-----   Message from [place], [place] sent at 08/25/2020  7:57 PM EST ----- Thoracic aortic aneurysm remain unchanged at 4.3 cm, repeat study in 1 year

## 2020-09-03 ENCOUNTER — Telehealth: Payer: Self-pay | Admitting: Internal Medicine

## 2020-09-03 NOTE — Telephone Encounter (Signed)
Crystal from [facility] calling to find out if the patient needs another CT. The patient had one done 08/24/2020, but there is another order.

## 2020-09-03 NOTE — Telephone Encounter (Signed)
Spoke with Crystal at [facility] f/u CT order was placed for next year to follow Thoracic aortic aneurysm.

## 2020-12-06 ENCOUNTER — Other Ambulatory Visit: Payer: Self-pay | Admitting: Internal Medicine

## 2021-03-07 ENCOUNTER — Other Ambulatory Visit: Payer: Self-pay | Admitting: Internal Medicine

## 2021-04-03 ENCOUNTER — Other Ambulatory Visit: Payer: Self-pay | Admitting: Internal Medicine

## 2021-07-02 ENCOUNTER — Other Ambulatory Visit: Payer: Self-pay | Admitting: Internal Medicine

## 2021-07-02 NOTE — Telephone Encounter (Signed)
Prescription refill request for Eliquis received. Indication:DVT Last office visit:Needs Appt AJG:OTLX[facility] Age: 87 Weight:92.8 kg  Prescription refilled

## 2021-07-27 ENCOUNTER — Other Ambulatory Visit: Payer: Self-pay | Admitting: Internal Medicine

## 2021-08-01 ENCOUNTER — Other Ambulatory Visit: Payer: Self-pay | Admitting: Internal Medicine

## 2021-08-02 NOTE — Telephone Encounter (Signed)
88 M 92.8 kg SCr 1.79

## 2021-08-15 ENCOUNTER — Other Ambulatory Visit: Payer: Self-pay | Admitting: Internal Medicine

## 2021-08-27 ENCOUNTER — Other Ambulatory Visit: Payer: Self-pay

## 2021-08-27 DIAGNOSIS — I712 Thoracic aortic aneurysm, without rupture, unspecified: Secondary | ICD-10-CM

## 2021-08-31 ENCOUNTER — Other Ambulatory Visit: Payer: Self-pay | Admitting: Internal Medicine

## 2021-08-31 ENCOUNTER — Other Ambulatory Visit: Payer: Self-pay

## 2021-08-31 MED ORDER — APIXABAN 2.5 MG PO TABS: ORAL_TABLET | ORAL | 1 refills | Status: DC

## 2021-08-31 NOTE — Telephone Encounter (Signed)
Prescription refill request for Eliquis received. Indication: dvt  Last office visit:12/12/2019 Scr: 1.79, 03/24/2021 Age: 87 yo  Weight: 92.8 kg   Pt is overdue for an office visit. Msg sent to schedulers.

## 2021-10-06 ENCOUNTER — Other Ambulatory Visit: Payer: Self-pay

## 2021-10-06 ENCOUNTER — Ambulatory Visit
Admission: RE | Admit: 2021-10-06 | Discharge: 2021-10-06 | Disposition: A | Discharge status: Home / Self Care | Payer: Medicare PPO | Source: Ambulatory Visit | Attending: Internal Medicine | Admitting: Internal Medicine

## 2021-10-06 DIAGNOSIS — I712 Thoracic aortic aneurysm, without rupture, unspecified: Secondary | ICD-10-CM

## 2021-10-11 ENCOUNTER — Other Ambulatory Visit: Payer: Self-pay | Admitting: Internal Medicine

## 2021-10-11 DIAGNOSIS — I712 Thoracic aortic aneurysm, without rupture, unspecified: Secondary | ICD-10-CM

## 2021-11-01 ENCOUNTER — Other Ambulatory Visit: Payer: Self-pay | Admitting: Internal Medicine

## 2021-11-01 NOTE — Telephone Encounter (Addendum)
Prescription refill request for Eliquis received. Indication:CVA/DVT Last office visit:Upcoming Scr:1.7 Age: 88 Weight:92.8 kg  Prescription refilled

## 2021-11-12 ENCOUNTER — Encounter: Payer: Self-pay | Admitting: Internal Medicine

## 2021-12-16 ENCOUNTER — Ambulatory Visit: Payer: Medicare PPO | Admitting: Internal Medicine

## 2021-12-16 ENCOUNTER — Encounter: Payer: Self-pay | Admitting: Internal Medicine

## 2021-12-16 VITALS — BP 110/60 | HR 68 | Ht 71.0 in | Wt 195.0 lb

## 2021-12-16 DIAGNOSIS — I4819 Other persistent atrial fibrillation: Secondary | ICD-10-CM

## 2021-12-16 DIAGNOSIS — I2510 Atherosclerotic heart disease of native coronary artery without angina pectoris: Secondary | ICD-10-CM | POA: Diagnosis not present

## 2021-12-16 DIAGNOSIS — Z7901 Long term (current) use of anticoagulants: Secondary | ICD-10-CM | POA: Diagnosis not present

## 2021-12-16 DIAGNOSIS — Z951 Presence of aortocoronary bypass graft: Secondary | ICD-10-CM

## 2021-12-16 DIAGNOSIS — I712 Thoracic aortic aneurysm, without rupture, unspecified: Secondary | ICD-10-CM | POA: Diagnosis not present

## 2021-12-16 NOTE — Progress Notes (Signed)
? ? ?OFFICE NOTE ? ?Chief Complaint:  ?Follow-up ? ?Primary Care Physician: ?Eartha Inch, MD ? ?HPI:  ?Patrick Giles is a 88 year old gentleman, previously followed by Dr. Clarene Duke, with history of CABG in 1998 with a LIMA to LAD, saphenous vein graft to PDA, and vein graft to first diagonal. In 2006, he had a stress test, which showed an EF of about 43% to 44% with a fixed inferoapical defect. That was again present in 2010, suggesting he may have lost a vein graft, possibly to the PDA, and has a fixed defect which looks like scar. The recent, repeat echo in 2014 showed an EF of 45-50% with inferolateral hypokinesis. He is able to walk without any shortness of breath or worsening symptoms. He is on an ACE inhibitor and a B-blocker was added due to CAD and HTN.  He has tolerated this well.  He recently had a lipid profile showing total cholesterol 161, HDL 51, triglycerides 126 and LDL 96. Fasting glucose was 119. Creatinine is stable at 1.5. ? ?12/09/2015 ? ?Patrick Giles returns today for follow-up. In November he was noted to have some blood in the urine and underwent transurethral resection of the prostate. Subsequent to that he was found to have DVT and this may or may not be related to his surgery. He was placed on Eliquis which she continues to take. This is followed by his primary care provider. He denies any worsening chest pain or shortness of breath.  ? ?01/18/2017 ? ?Patrick Giles returns today for follow-up. Overall he feels well denies any chest pain or worsening shortness of breath. When we last saw him he had repeat echo which showed EF around 45%. He is on appropriate medical therapy. Weight is stable if not decreased somewhat. He remains physically active and plays golf a couple times a week. 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 He is originally from the [place] area of [place]. When we last saw him his echo  demonstrated a dilated aortic root up to about 4.9 cm. He had CT angiogram of the aorta which reevaluated that. He's overdue for follow-up on that aneurysm. He's not had any blood work recently either. ? ?01/18/2018 ? ?Patrick Giles was seen today in follow-up.  He is generally without complaints.  He denies chest pain or worsening shortness of breath.  Is been a year since I last saw him.  We are following him for his coronary disease as well as aortopathy.  Last year his CT scan showed a stable aortic root size of 4.9 cm.  It is not yet reach significance for surgery.  We will plan a repeat CT to evaluate this.  I also increase his Lipitor to reach goal LDL less than 70 and he will need repeat lab work for that.  LVEF was around 45%.  He continues to be active and plays golf several times a week. ? ?05/20/2019 ? ?Patrick Giles is seen today in follow-up.  Unfortunately, he suffered a stroke in May 19, affecting the left posterior frontal periventricular and subcortical white matter.  Fortunately he had resolution of some of his symptoms and his made a good recovery.  The etiology of his stroke is not clear.  He did have an echocardiogram which was considered a poor study but mentioned that he had grossly normal LV function.  This would represent an improvement in LVEF from 40 to 45%  in the past.  Fortunately has had no more neurologic deficits.  He remains asymptomatic with no chest pain or shortness of breath.  Blood pressures well controlled today.  EKG shows sinus rhythm with some PACs at 86. ? ?12/16/2021 ? ?Patrick Giles is seen today in follow-up.  He denies any chest pain or worsening shortness of breath.  Blood pressure is well controlled.  He has been on Eliquis since his stroke and was found to have some atrial fibrillation.  He is in A-fib today at a rate of 68 with a right bundle branch block.  He did have recent repeat imaging of his aortic aneurysm which appears to be stable at 4.3 cm. ? ?PMHx:  ?Past Medical History:   ?Diagnosis Date  ? CAD (coronary artery disease)   ? Dyslipidemia   ? Hypertension   ? S/P CABG (coronary artery bypass graft) 1998  ? LIMA to LAD, VG to PDA, VG to first diagonal  ? Stroke [facility](HCC)   ? ? ?Past Surgical History:  ?Procedure Laterality Date  ? CHOLECYSTECTOMY  1998  ? CORONARY ARTERY BYPASS GRAFT  07/08/1997  ? LIMA to LAD, reverse SVG to PDA, reverse SVG to first diagonal (Dr. Wynetta FinesE. Gerhardt)  ? TRANSTHORACIC ECHOCARDIOGRAM  11/29/2012  ? EF 45-50%, grade 1 diastolic dysfunction  ? ? ?FAMHx:  ?Family History  ?Problem Relation Age of Onset  ? Emphysema Mother   ? Colon cancer Father   ? Colon polyps Father   ? Lung cancer Brother   ? Diabetes Neg Hx   ? Kidney disease Neg Hx   ? Esophageal cancer Neg Hx   ? ? ?SOCHx:  ? reports that he quit smoking about 33 years ago. His smoking use included cigars. He has never used smokeless tobacco. He reports that he does not drink alcohol and does not use drugs. ? ?ALLERGIES:  ?No Known Allergies ? ?ROS: ?Pertinent items noted in HPI and remainder of comprehensive ROS otherwise negative. ? ?HOME MEDS: ?Current Outpatient Medications  ?Medication Sig Dispense Refill  ? amLODipine-benazepril (LOTREL) 5-20 MG capsule OFFICE VISIT NEEDED FOR FUTURE REFILLS 15 capsule 0  ? apixaban (ELIQUIS) 2.5 MG TABS tablet Take 1 tablet (2.5 mg total) by mouth 2 (two) times daily. 60 tablet 1  ? atorvastatin (LIPITOR) 40 MG tablet TAKE 1 TABLET BY MOUTH EVERY DAY AT 6PM (Patient taking differently: Take 40 mg by mouth daily at 6 PM.) 90 tablet 3  ? GARLIC PO Take 0,8651,000 mg by mouth daily.     ? metoprolol succinate (TOPROL-XL) 25 MG 24 hr tablet Take 25 mg by mouth daily.    ? Multiple Vitamins-Minerals (ALIVE MENS ENERGY PO) Take 1 tablet by mouth daily.     ? Omega-3 Fatty Acids (FISH OIL) 1200 MG CAPS Take 2 capsules by mouth daily.    ? ?No current facility-administered medications for this visit.  ? ? ?LABS/IMAGING: ?No results found for this or any previous visit (from the past  48 hour(s)). ?No results found. ? ?VITALS: ?BP 110/60 (BP Location: Left Arm, Patient Position: Sitting, Cuff Size: Normal)   Pulse 68   Ht 5\' 11"  (1.803 m)   Wt 195 lb (88.5 kg)   BMI 27.20 kg/m?  ? ?EXAM: ?General appearance: alert and no distress ?Neck: no carotid bruit and no JVD ?Lungs: clear to auscultation bilaterally ?Heart: regular rate and rhythm, S1, S2 normal, no murmur, click, rub or gallop ?Abdomen: soft, non-tender; bowel sounds normal; no masses,  no organomegaly ?  Extremities: extremities normal, atraumatic, no cyanosis or edema ?Pulses: 2+ and symmetric ?Skin: Skin color, texture, turgor normal. No rashes or lesions ?Neurologic: Grossly normal ?Psych: Mood, affect normal ? ?EKG: ?A-fib at 68, RBBB-personally reviewed ? 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 ?PLAN: ?1.   Mr. Dragoo remains in atrial fibrillation with a right bundle branch block on Eliquis.  He has had no recurrent stroke.  He has a stable aortic aneurysm at 4.3 cm.  His last echo in 2019 showed normal LV function.  Blood pressure is well controlled today.  No change in his medicines.  Plan follow-up with me annually or sooner as necessary ? ?Chrystie Nose, MD, [facility], FACP  ?[place]  [facility]  ?Medical Director of the Advanced Lipid Disorders &  ?Cardiovascular Risk Reduction Clinic ?Diplomate of the [employer] of Clinical Lipidology ?Attending Cardiologist  ?Direct Dial: [phone]  Fax: [phone]  ?Website:  [email or web address] ? ? ?Lisette Abu Javious Hallisey ?12/16/2021, 11:19 AM ?

## 2021-12-16 NOTE — Patient Instructions (Signed)
Medication Instructions:  ?NO CHANGES ? ?*If you need a refill on your cardiac medications before your next appointment, please call your pharmacy* ? ?Testing/Procedures: ?Chest CT due January 2024 ? ? ?Follow-Up: ?At [facility], you and your health needs are our priority.  As part of our continuing mission to provide you with exceptional heart care, we have created designated Provider Care Teams.  These Care Teams include your primary Cardiologist (physician) and Advanced Practice Providers (APPs -  Physician Assistants and Nurse Practitioners) who all work together to provide you with the care you need, when you need it. ? ?We recommend signing up for the patient portal called "MyChart".  Sign up information is provided on this After Visit Summary.  MyChart is used to connect with patients for Virtual Visits (Telemedicine).  Patients are able to view lab/test results, encounter notes, upcoming appointments, etc.  Non-urgent messages can be sent to your provider as well.   ?To learn more about what you can do with MyChart, go to [email or web address].   ? ?Your next appointment:   ?1 year with Dr. Rennis Golden ? ?

## 2022-01-03 ENCOUNTER — Ambulatory Visit: Payer: Medicare PPO | Admitting: Internal Medicine

## 2022-01-04 ENCOUNTER — Telehealth: Payer: Self-pay | Admitting: Internal Medicine

## 2022-01-04 NOTE — Telephone Encounter (Signed)
? ?*  STAT* If patient is at the pharmacy, call can be transferred to refill team. ? ? ?1. Which medications need to be refilled? (please list name of each medication and dose if known) apixaban (ELIQUIS) 2.5 MG TABS tablet ? ?2. Which pharmacy/location (including street and city if local pharmacy) is medication to be sent to?[id number] - [place], [place] - [address] AT [address] ? ?3. Do they need a 30 day or 90 day supply? 90 days ? ?Pt is out of meds needs refill today ?

## 2022-01-05 ENCOUNTER — Other Ambulatory Visit: Payer: Self-pay | Admitting: Internal Medicine

## 2022-01-06 NOTE — Telephone Encounter (Signed)
Prescription refill request for Eliquis received. ?Indication:DVT ?Last office visit:3/23 ?Scr:1.7 ?Age: 88 ?Weight:88.5 kg ? ?Prescription refilled ? ?

## 2022-10-02 ENCOUNTER — Emergency Department (HOSPITAL_COMMUNITY): Payer: Medicare PPO

## 2022-10-02 ENCOUNTER — Inpatient Hospital Stay (HOSPITAL_COMMUNITY)
Admission: EM | Admit: 2022-10-02 | Discharge: 2022-10-06 | DRG: 389 | Disposition: A | Discharge status: Home Health | Payer: Medicare PPO | Attending: Internal Medicine | Admitting: Internal Medicine

## 2022-10-02 ENCOUNTER — Encounter (HOSPITAL_COMMUNITY): Payer: Self-pay

## 2022-10-02 ENCOUNTER — Other Ambulatory Visit: Payer: Self-pay

## 2022-10-02 DIAGNOSIS — I69341 Monoplegia of lower limb following cerebral infarction affecting right dominant side: Secondary | ICD-10-CM

## 2022-10-02 DIAGNOSIS — Z800 Family history of malignant neoplasm of digestive organs: Secondary | ICD-10-CM

## 2022-10-02 DIAGNOSIS — R509 Fever, unspecified: Secondary | ICD-10-CM

## 2022-10-02 DIAGNOSIS — R531 Weakness: Secondary | ICD-10-CM

## 2022-10-02 DIAGNOSIS — Z951 Presence of aortocoronary bypass graft: Secondary | ICD-10-CM

## 2022-10-02 DIAGNOSIS — K5792 Diverticulitis of intestine, part unspecified, without perforation or abscess without bleeding: Secondary | ICD-10-CM | POA: Diagnosis not present

## 2022-10-02 DIAGNOSIS — K529 Noninfective gastroenteritis and colitis, unspecified: Secondary | ICD-10-CM

## 2022-10-02 DIAGNOSIS — K567 Ileus, unspecified: Secondary | ICD-10-CM | POA: Diagnosis present

## 2022-10-02 DIAGNOSIS — E785 Hyperlipidemia, unspecified: Secondary | ICD-10-CM | POA: Diagnosis present

## 2022-10-02 DIAGNOSIS — N183 Chronic kidney disease, stage 3 unspecified: Secondary | ICD-10-CM | POA: Diagnosis present

## 2022-10-02 DIAGNOSIS — J398 Other specified diseases of upper respiratory tract: Secondary | ICD-10-CM | POA: Diagnosis present

## 2022-10-02 DIAGNOSIS — W19XXXA Unspecified fall, initial encounter: Secondary | ICD-10-CM | POA: Diagnosis not present

## 2022-10-02 DIAGNOSIS — I129 Hypertensive chronic kidney disease with stage 1 through stage 4 chronic kidney disease, or unspecified chronic kidney disease: Secondary | ICD-10-CM | POA: Diagnosis present

## 2022-10-02 DIAGNOSIS — Z66 Do not resuscitate: Secondary | ICD-10-CM | POA: Diagnosis present

## 2022-10-02 DIAGNOSIS — E860 Dehydration: Secondary | ICD-10-CM | POA: Diagnosis present

## 2022-10-02 DIAGNOSIS — D696 Thrombocytopenia, unspecified: Secondary | ICD-10-CM | POA: Diagnosis present

## 2022-10-02 DIAGNOSIS — I4891 Unspecified atrial fibrillation: Secondary | ICD-10-CM | POA: Diagnosis present

## 2022-10-02 DIAGNOSIS — R197 Diarrhea, unspecified: Secondary | ICD-10-CM

## 2022-10-02 DIAGNOSIS — Y92012 Bathroom of single-family (private) house as the place of occurrence of the external cause: Secondary | ICD-10-CM

## 2022-10-02 DIAGNOSIS — I7121 Aneurysm of the ascending aorta, without rupture: Secondary | ICD-10-CM | POA: Diagnosis present

## 2022-10-02 DIAGNOSIS — K56600 Partial intestinal obstruction, unspecified as to cause: Principal | ICD-10-CM | POA: Diagnosis present

## 2022-10-02 DIAGNOSIS — K5730 Diverticulosis of large intestine without perforation or abscess without bleeding: Secondary | ICD-10-CM | POA: Insufficient documentation

## 2022-10-02 DIAGNOSIS — M25511 Pain in right shoulder: Secondary | ICD-10-CM | POA: Diagnosis present

## 2022-10-02 DIAGNOSIS — R112 Nausea with vomiting, unspecified: Secondary | ICD-10-CM | POA: Diagnosis present

## 2022-10-02 DIAGNOSIS — R7989 Other specified abnormal findings of blood chemistry: Secondary | ICD-10-CM | POA: Diagnosis present

## 2022-10-02 DIAGNOSIS — E872 Acidosis, unspecified: Secondary | ICD-10-CM | POA: Diagnosis present

## 2022-10-02 DIAGNOSIS — M8580 Other specified disorders of bone density and structure, unspecified site: Secondary | ICD-10-CM | POA: Diagnosis present

## 2022-10-02 DIAGNOSIS — Z825 Family history of asthma and other chronic lower respiratory diseases: Secondary | ICD-10-CM

## 2022-10-02 DIAGNOSIS — I959 Hypotension, unspecified: Secondary | ICD-10-CM | POA: Diagnosis present

## 2022-10-02 DIAGNOSIS — D649 Anemia, unspecified: Secondary | ICD-10-CM | POA: Diagnosis present

## 2022-10-02 DIAGNOSIS — Z801 Family history of malignant neoplasm of trachea, bronchus and lung: Secondary | ICD-10-CM

## 2022-10-02 DIAGNOSIS — W1811XA Fall from or off toilet without subsequent striking against object, initial encounter: Secondary | ICD-10-CM | POA: Diagnosis present

## 2022-10-02 DIAGNOSIS — I482 Chronic atrial fibrillation, unspecified: Secondary | ICD-10-CM | POA: Insufficient documentation

## 2022-10-02 DIAGNOSIS — K5732 Diverticulitis of large intestine without perforation or abscess without bleeding: Secondary | ICD-10-CM | POA: Diagnosis present

## 2022-10-02 DIAGNOSIS — E861 Hypovolemia: Secondary | ICD-10-CM | POA: Diagnosis present

## 2022-10-02 DIAGNOSIS — N179 Acute kidney failure, unspecified: Secondary | ICD-10-CM | POA: Diagnosis present

## 2022-10-02 DIAGNOSIS — R140 Abdominal distension (gaseous): Secondary | ICD-10-CM | POA: Diagnosis present

## 2022-10-02 DIAGNOSIS — Z7689 Persons encountering health services in other specified circumstances: Secondary | ICD-10-CM

## 2022-10-02 DIAGNOSIS — Z86718 Personal history of other venous thrombosis and embolism: Secondary | ICD-10-CM

## 2022-10-02 DIAGNOSIS — S2242XS Multiple fractures of ribs, left side, sequela: Secondary | ICD-10-CM

## 2022-10-02 DIAGNOSIS — Z79899 Other long term (current) drug therapy: Secondary | ICD-10-CM

## 2022-10-02 DIAGNOSIS — N1832 Chronic kidney disease, stage 3b: Secondary | ICD-10-CM | POA: Diagnosis present

## 2022-10-02 DIAGNOSIS — I2510 Atherosclerotic heart disease of native coronary artery without angina pectoris: Secondary | ICD-10-CM | POA: Diagnosis present

## 2022-10-02 DIAGNOSIS — Y92009 Unspecified place in unspecified non-institutional (private) residence as the place of occurrence of the external cause: Principal | ICD-10-CM

## 2022-10-02 DIAGNOSIS — Z83719 Family history of colon polyps, unspecified: Secondary | ICD-10-CM

## 2022-10-02 DIAGNOSIS — Z7901 Long term (current) use of anticoagulants: Secondary | ICD-10-CM

## 2022-10-02 LAB — COMPREHENSIVE METABOLIC PANEL
ALT: 24 U/L (ref 0–44)
AST: 48 U/L — ABNORMAL HIGH (ref 15–41)
Albumin: 3 g/dL — ABNORMAL LOW (ref 3.5–5.0)
Alkaline Phosphatase: 94 U/L (ref 38–126)
Anion gap: 14 (ref 5–15)
BUN: 37 mg/dL — ABNORMAL HIGH (ref 8–23)
CO2: 17 mmol/L — ABNORMAL LOW (ref 22–32)
Calcium: 9.2 mg/dL (ref 8.9–10.3)
Chloride: 106 mmol/L (ref 98–111)
Creatinine, Ser: 2.82 mg/dL — ABNORMAL HIGH (ref 0.61–1.24)
GFR, Estimated: 21 mL/min — ABNORMAL LOW (ref >60)
Glucose, Bld: 169 mg/dL — ABNORMAL HIGH (ref 70–99)
Potassium: 4.7 mmol/L (ref 3.5–5.1)
Sodium: 137 mmol/L (ref 135–145)
Total Bilirubin: 1 mg/dL (ref 0.3–1.2)
Total Protein: 5.8 g/dL — ABNORMAL LOW (ref 6.5–8.1)

## 2022-10-02 LAB — CBC WITH DIFFERENTIAL/PLATELET
Abs Immature Granulocytes: 0.07 10*3/uL (ref 0.00–0.07)
Basophils Absolute: 0 10*3/uL (ref 0.0–0.1)
Basophils Relative: 0 %
Eosinophils Absolute: 0 10*3/uL (ref 0.0–0.5)
Eosinophils Relative: 0 %
HCT: 41.6 % (ref 39.0–52.0)
Hemoglobin: 13.5 g/dL (ref 13.0–17.0)
Immature Granulocytes: 1 %
Lymphocytes Relative: 5 %
Lymphs Abs: 0.6 10*3/uL — ABNORMAL LOW (ref 0.7–4.0)
MCH: 30.5 pg (ref 26.0–34.0)
MCHC: 32.5 g/dL (ref 30.0–36.0)
MCV: 94.1 fL (ref 80.0–100.0)
Monocytes Absolute: 0.8 10*3/uL (ref 0.1–1.0)
Monocytes Relative: 6 %
Neutro Abs: 11.7 10*3/uL — ABNORMAL HIGH (ref 1.7–7.7)
Neutrophils Relative %: 88 %
Platelets: 134 10*3/uL — ABNORMAL LOW (ref 150–400)
RBC: 4.42 MIL/uL (ref 4.22–5.81)
RDW: 12.7 % (ref 11.5–15.5)
WBC: 13.2 10*3/uL — ABNORMAL HIGH (ref 4.0–10.5)
nRBC: 0 % (ref 0.0–0.2)

## 2022-10-02 LAB — CK: Total CK: 386 U/L (ref 49–397)

## 2022-10-02 LAB — TROPONIN I (HIGH SENSITIVITY): Troponin I (High Sensitivity): 277 ng/L (ref <18)

## 2022-10-02 MED ORDER — LABETALOL HCL 5 MG/ML IV SOLN
10.0000 mg | Freq: Once | INTRAVENOUS | Status: DC
  Filled 2022-10-02: qty 4

## 2022-10-02 MED ORDER — SODIUM CHLORIDE 0.9 % IV BOLUS
500.0000 mL | Freq: Once | INTRAVENOUS | Status: AC
Start: 2022-10-02 — End: 2022-10-02
  Administered 2022-10-02: 500 mL via INTRAVENOUS

## 2022-10-02 MED ORDER — LABETALOL HCL 5 MG/ML IV SOLN
5.0000 mg | Freq: Once | INTRAVENOUS | Status: AC
  Administered 2022-10-02: 5 mg via INTRAVENOUS

## 2022-10-02 MED ORDER — SODIUM CHLORIDE 0.9 % IV BOLUS
500.0000 mL | Freq: Once | INTRAVENOUS | Status: AC
  Administered 2022-10-02: 500 mL via INTRAVENOUS

## 2022-10-02 NOTE — ED Notes (Signed)
Patient returned from CT

## 2022-10-02 NOTE — Consult Note (Signed)
Surgical Evaluation Requesting provider: Dr. Mayra Neer  Chief Complaint: Syncopal event  HPI: Patient is somewhat poor historian, most of history is taken from chart review.  89 year old male with history of CAD status post CABG, hypertension, hyperlipidemia, stroke, A-fib and DVT on Eliquis, thoracic aortic aneurysm who is brought in by EMS earlier this evening after a possible fall.  Patient denies any trauma to his head or loss of consciousness however he fell at home and was down for about 10 hours.  Patient states that he woke around 5 AM to use the restroom, walked to the bathroom, and then woke up on the ground.  He was unable to get up.  Noted some hypotension when EMS arrived and felt cold.  This improved with fluid resuscitation.  Patient reported right shoulder pain.  No evidence of seizures.  Denies any precedent acute illness or fever and has been compliant with his medications.  He has undergone a thorough trauma workup and does not appear to have any traumatic injury but multiple incidental findings on his CT-see report below.  General surgery is consulted regarding radiographic report of mild acute distal descending diverticulitis and small bowel obstruction with dilation up to 3.7 cm without overt transition point.  He thinks he may have had some nausea or vomiting recently but he is not sure when or how much.  He thinks he had a bowel movement this evening.  Does not endorse any decreased appetite or abdominal pain. His labs are notable for creatinine of 2.8, WBC 13.2, troponin elevated at 277  No Known Allergies  Past Medical History:  Diagnosis Date   CAD (coronary artery disease)    Dyslipidemia    Hypertension    S/P CABG (coronary artery bypass graft) 1998   LIMA to LAD, VG to PDA, VG to first diagonal   Stroke [facility])     Past Surgical History:  Procedure Laterality Date   CHOLECYSTECTOMY  1998   CORONARY ARTERY BYPASS GRAFT  07/08/1997   LIMA to LAD, reverse SVG to PDA,  reverse SVG to first diagonal (Dr. Festus Barren)   TRANSTHORACIC ECHOCARDIOGRAM  11/29/2012   EF 40-98%, grade 1 diastolic dysfunction    Family History  Problem Relation Age of Onset   Emphysema Mother    Colon cancer Father    Colon polyps Father    Lung cancer Brother    Diabetes Neg Hx    Kidney disease Neg Hx    Esophageal cancer Neg Hx     Social History   Socioeconomic History   Marital status: Married    Spouse name: Not on file   Number of children: 5   Years of education: 13   Highest education level: Not on file  Occupational History   Occupation: Retired  Tobacco Use   Smoking status: Former    Types: Cigars    Quit date: 09/19/1988    Years since quitting: 34.0   Smokeless tobacco: Never  Vaping Use   Vaping Use: Never used  Substance and Sexual Activity   Alcohol use: No    Alcohol/week: 0.0 standard drinks of alcohol   Drug use: No   Sexual activity: Not on file  Other Topics Concern   Not on file  Social History Narrative   Not on file   Social Determinants of Health   Financial Resource Strain: Not on file  Food Insecurity: Not on file  Transportation Needs: Not on file  Physical Activity: Not on file  Stress: Not  on file  Social Connections: Not on file    No current facility-administered medications on file prior to encounter.   Current Outpatient Medications on File Prior to Encounter  Medication Sig Dispense Refill   amLODipine-benazepril (LOTREL) 5-20 MG capsule OFFICE VISIT NEEDED FOR FUTURE REFILLS (Patient taking differently: Take 1 capsule by mouth daily. OFFICE VISIT NEEDED FOR FUTURE REFILLS) 15 capsule 0   apixaban (ELIQUIS) 2.5 MG TABS tablet TAKE 1 TABLET BY MOUTH TWICE A DAY (Patient taking differently: Take 2.5 mg by mouth 2 (two) times daily.) 60 tablet 5   atorvastatin (LIPITOR) 40 MG tablet TAKE 1 TABLET BY MOUTH EVERY DAY AT 6PM (Patient taking differently: Take 40 mg by mouth daily.) 90 tablet 3   GARLIC PO Take 2,423 mg  by mouth daily.      metoprolol succinate (TOPROL-XL) 25 MG 24 hr tablet Take 25 mg by mouth daily.     Multiple Vitamins-Minerals (ALIVE MENS ENERGY PO) Take 1 tablet by mouth daily.      Omega-3 Fatty Acids (FISH OIL) 1200 MG CAPS Take 2 capsules by mouth daily.      Review of Systems: a complete, 10pt review of systems was completed with pertinent positives and negatives as documented in the HPI  Physical Exam: Vitals:   10/02/22 2215 10/02/22 2245  BP: (!) 114/49 126/65  Pulse: 88 83  Resp: (!) 26 (!) 23  Temp:    SpO2: 94% 94%   Gen: Alert, calm, cooperative Eyes: lids and conjunctivae normal, no icterus. Pupils equally round and reactive to light.  Neck: supple without mass or thyromegaly.  No C-spine tenderness. Chest: respiratory effort is normal. No crepitus or tenderness on palpation of the chest. Breath sounds equal.  Cardiovascular: RRR with palpable distal pulses, no pedal edema Gastrointestinal: soft, distended with some borborygmi, mildly tender in the left lower quadrant as well as the right hemiabdomen.  Right subcostal scar Lymphatic: no lymphadenopathy in the neck or groin Muscoloskeletal: no clubbing or cyanosis of the fingers.  Strength is symmetrical throughout.  Range of motion of bilateral upper and lower extremities normal without pain, crepitation or contracture. Neuro: cranial nerves grossly intact.  Sensation intact to light touch diffusely. Psych: appropriate mood and affect, normal insight/judgment intact  Skin: warm and dry      Latest Ref Rng & Units 10/02/2022    6:37 PM 06/18/2018   10:10 AM 06/18/2018   10:01 AM  CBC  WBC 4.0 - 10.5 K/uL 13.2   7.0   Hemoglobin 13.0 - 17.0 g/dL 13.5  13.9  13.6   Hematocrit 39.0 - 52.0 % 41.6  41.0  42.0   Platelets 150 - 400 K/uL 134   169        Latest Ref Rng & Units 10/02/2022    6:37 PM 06/21/2019    2:07 PM 05/20/2019    4:20 PM  CMP  Glucose 70 - 99 mg/dL 169  85  107   BUN 8 - 23 mg/dL 37  28  36    Creatinine 0.61 - 1.24 mg/dL 2.82  1.82  1.96   Sodium 135 - 145 mmol/L 137  138  143   Potassium 3.5 - 5.1 mmol/L 4.7  5.2  5.6   Chloride 98 - 111 mmol/L 106  99  102   CO2 22 - 32 mmol/L 17  21  23    Calcium 8.9 - 10.3 mg/dL 9.2  9.9  10.1   Total Protein 6.5 - 8.1  g/dL 5.8     Total Bilirubin 0.3 - 1.2 mg/dL 1.0     Alkaline Phos 38 - 126 U/L 94     AST 15 - 41 U/L 48     ALT 0 - 44 U/L 24       Lab Results  Component Value Date   INR 0.98 06/18/2018   INR 1.10 07/23/2014    Imaging: CT Head Wo Contrast  Result Date: 10/02/2022 CLINICAL DATA:  The patient found down after an unwitnessed fall, reportedly was on the floor for 10 hours before discovery. Suspected blunt polytrauma but denies hitting head. EXAM: CT HEAD WITHOUT CONTRAST CT CERVICAL SPINE WITHOUT CONTRAST CT CHEST, ABDOMEN AND PELVIS WITHOUT CONTRAST TECHNIQUE: Contiguous axial images were obtained from the base of the skull through the vertex without intravenous contrast. Multidetector CT imaging of the cervical spine was performed without intravenous contrast. Multiplanar CT image reconstructions were also generated. Multidetector CT imaging of the chest, abdomen and pelvis was performed following the standard protocol without IV contrast. RADIATION DOSE REDUCTION: This exam was performed according to the departmental dose-optimization program which includes automated exposure control, adjustment of the mA and/or kV according to patient size and/or use of iterative reconstruction technique. COMPARISON:  Last brain study was MRI brain 06/18/2018, last neck study with CT angiography neck 06/18/2018, with comparison made to chest CT without contrast 10/06/2021 and an abdomen and pelvis CT with contrast of 05/22/2014. FINDINGS: CT HEAD FINDINGS Brain: There is mild global atrophy for age, mild small-vessel disease of the cerebral white matter, and chronic lacunar infarcts in both gangliocapsular areas left frontoparietal white  matter near the vertex. No acute infarct, hemorrhage or mass is seen. Ventricles are normal in size and position. There are benign dural calcifications scattered along side of the falx. Vascular: There are patchy calcifications both carotid siphons, left vertebral artery and basilar artery. No hyperdense central vessel. Skull: No scalp hematoma is seen. The calvarium, skull base and orbits are intact. There is a chronic fracture deformity of the nasal bone. Sinuses/Orbits: Unremarkable orbits and orbital contents. There is diffuse fluid opacification of the right mastoid air cells and middle ear cavity and a small attenuated right ossicular chain. This appears to have been present previously. Moderate patchy fluid in the lower left mastoid air cells is also similar. There is worsening multifocal opacification of the ethmoid air cells. There is increased membrane thickening and bilateral small low-density fluid levels in the maxillary sinuses. The frontal and sphenoid sinus are clear.  Nasal septum is S shaped. Other: None. CT CERVICAL FINDINGS Alignment: Chronic trace degenerative anterolisthesis C3-4, unchanged, and also again noted mild reversed cervical lordosis. There is bone-on-bone anterior atlantodental joint space loss, with small osteophytes, as before. No new or acute alignment abnormality. Skull base and vertebrae: Patient motion limits the sensitivity of the study. No spinal compression fracture or obvious displaced fracture seen. Osteopenia is noted but no other primary pathologic process of bone is seen. Soft tissues and spinal canal: Limited due to motion. No prevertebral fluid or soft tissue swelling. No obvious canal hematoma. The proximal cervical ICAs are heavily calcified. No thyroid or laryngeal masses seen. Disc levels: Chronic disc collapse C4-5 through C6-7. There are small bidirectional osteophytes but no significant soft tissue or bony encroachment on the thecal sac. Other cervical discs  are normal in height. There are facet joint and uncinate osteophytes at all levels. There is moderate to severe degenerative foraminal stenosis at C3-4, but elsewhere no  significant foraminal encroachment. Other: None. CT CHEST FINDINGS Cardiovascular: The cardiac size is normal. 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 Stable dilatation of the pulmonary trunk, 3.7 cm indicating chronic arterial hypertension. Aortic atherosclerosis and scattered plaque in the great vessels is also again noted as well as stable ascending aortic dilatation to 4.3 cm. Pulmonary veins are decompressed. Mediastinum/Nodes: No intrathoracic, supraclavicular or axillary adenopathy. Small shotty subcarinal lymph nodes are unchanged. There are calcified right paratracheal nodes chronically. There is no thyroid mass, tracheal or main bronchi filling defect, or esophageal thickening and no hiatal hernia. Lungs/Pleura: Chronic severe elevation left hemidiaphragm consistent with hernia, paresis or large eventration. The dome reaching as high as the aortic arch and there is chronic overlying atelectasis in the left upper lobe. In the left lower lobe there are chronic compressive changes due to elevated hemidiaphragm or herniation, particularly through the basal segments, but today there is increased opacity which could indicate a superimposed pneumonic infiltrate as compared to the prior studies. There are scattered air bronchograms in the area. The left lower lobe superior segment remains clear. There are few linear scar-like opacities in the left upper lobe but no infiltrate. There is posterior atelectasis in the right lower lobe but no infiltrate. No nodule is seen. There is no pleural effusion, thickening or pneumothorax. There are small calcified pleural plaques in the posterior basal left chest, calcified right upper lobe granuloma posteriorly on 5:43. Musculoskeletal: There is mild chronic wedging of the T12  vertebral body, osteopenia and degenerative change in the lower thoracic spine. There are healed left rib cage fractures but no acute rib fracture is evident. The sternum is intact. No abnormal focal osseous lesions. CT ABDOMEN AND PELVIS FINDINGS Hepatobiliary: There are scattered calcified hepatic granulomas. Without contrast no hepatic injury or perihepatic hematoma is seen. There is old cholecystectomy without biliary dilatation. Pancreas: No focal abnormality is seen without contrast. Respiratory motion limits fine detail. Spleen: Intrathoracic. No focal abnormality is seen without contrast apart from scattered calcified granulomas. No splenomegaly. Limited fine detail due to breathing motion and streak artifact from the patient's arms in the field. Adrenals/Urinary Tract: There is no adrenal mass. There are numerous bilateral thin walled homogeneous renal cysts including several large cysts, largest on the left is 8.1 cm, largest on the right is in the upper pole approaching 9 cm. The larger of the cysts are slightly larger than in 2015 but no suspicious abnormality is seen in the unenhanced kidneys. There is no urinary stone or obstruction. There is no bladder thickening. Stomach/Bowel: The stomach is intrathoracic as are the distal transverse and proximal descending colon. The gastric thickness is normal. Small bowel is diffusely dilated up to 3.7 cm except in the right mid to lower abdomen where the distal ileal segments and terminal ileum are decompressed. The focal transition from dilated to decompressed caliber is not identified. The appendix is normal. The large bowel wall is unremarkable proximally. The descending and sigmoid colon show diverticula with wall thickening and stranding alongside the distal descending colon series 3 axial images 78-101, findings consistent with a mild acute distal descending diverticulitis. The sigmoid diverticulosis is uncomplicated. There is moderate retained stool in  the distal sigmoid and rectum. No wall thickening in the rectum. Vascular/Lymphatic: Moderate aortoiliac calcific plaque. Interval slight dilatation in the distal infrarenal aorta measuring 2.7 cm. Follow-up imaging at 5 year intervals is recommended. No adenopathy is seen. Reproductive: No prostatomegaly. Central prostatic calcifications are present and there  appears to be a TURP defect. Other: There has no free air, free hemorrhage, free fluid or incarcerated hernia. Musculoskeletal: There is levoscoliosis and degenerative changes of the lumbar spine. No spinal compression fracture or other acute regional osseous findings. Bridging osteophytes anterior right SI joint are noted. Mild-to-moderate left and mild right hip DJD. No surface hematoma is seen. IMPRESSION: 1. No acute intracranial CT findings or depressed skull fractures. Chronic change. 2. Left mastoid effusion, with diffuse right mastoid or middle ear opacification and chronic attenuated and small right ossicular chain. This appears to have been present in 2019 also. 3. Osteopenia and degenerative change without evidence of cervical fractures or traumatic listhesis. 4. 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 5. Increasing opacity in the basal segments of the left lower lobe which could be due to a superimposed pneumonic infiltrate versus increased compressive changes from the elevated hemidiaphragm. 6. Increasing ethmoid and maxillary sinus disease with small fluid levels in the maxillary sinuses. Correlate clinically for acute sinusitis. 7. No acute trauma related findings in the chest, abdomen or pelvis. 8. 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 9. Stable dilatation of the pulmonary trunk indicating chronic arterial hypertension. 10. Stable 4.3 cm ascending aortic dilatation. Annual CTA or MRA follow-up recommended. 11. Mild acute distal descending diverticulitis. No free  air, free fluid or abscess. 12. Small-bowel obstruction with dilatation to 3.7 cm. The distal ileal segments are decompressed. Etiology could be occult adhesions, occult internal hernia, enteritis or occult mass. A focal transition could not be found. 13. Numerous bilateral renal cysts. 14. Interval slight dilatation in the distal infrarenal aorta measuring 2.7 cm. Follow-up imaging at 5 year intervals is recommended. Aortic Atherosclerosis (ICD10-I70.0). Electronically Signed   By: Telford Nab M.D.   On: 10/02/2022 22:53   CT Cervical Spine Wo Contrast  Result Date: 10/02/2022 CLINICAL DATA:  The patient found down after an unwitnessed fall, reportedly was on the floor for 10 hours before discovery. Suspected blunt polytrauma but denies hitting head. EXAM: CT HEAD WITHOUT CONTRAST CT CERVICAL SPINE WITHOUT CONTRAST CT CHEST, ABDOMEN AND PELVIS WITHOUT CONTRAST TECHNIQUE: Contiguous axial images were obtained from the base of the skull through the vertex without intravenous contrast. Multidetector CT imaging of the cervical spine was performed without intravenous contrast. Multiplanar CT image reconstructions were also generated. Multidetector CT imaging of the chest, abdomen and pelvis was performed following the standard protocol without IV contrast. RADIATION DOSE REDUCTION: This exam was performed according to the departmental dose-optimization program which includes automated exposure control, adjustment of the mA and/or kV according to patient size and/or use of iterative reconstruction technique. COMPARISON:  Last brain study was MRI brain 06/18/2018, last neck study with CT angiography neck 06/18/2018, with comparison made to chest CT without contrast 10/06/2021 and an abdomen and pelvis CT with contrast of 05/22/2014. FINDINGS: CT HEAD FINDINGS Brain: There is mild global atrophy for age, mild small-vessel disease of the cerebral white matter, and chronic lacunar infarcts in both gangliocapsular  areas left frontoparietal white matter near the vertex. No acute infarct, hemorrhage or mass is seen. Ventricles are normal in size and position. There are benign dural calcifications scattered along side of the falx. Vascular: There are patchy calcifications both carotid siphons, left vertebral artery and basilar artery. No hyperdense central vessel. Skull: No scalp hematoma is seen. The calvarium, skull base and orbits are intact. There is a chronic fracture deformity of the nasal bone. Sinuses/Orbits: Unremarkable orbits  and orbital contents. There is diffuse fluid opacification of the right mastoid air cells and middle ear cavity and a small attenuated right ossicular chain. This appears to have been present previously. Moderate patchy fluid in the lower left mastoid air cells is also similar. There is worsening multifocal opacification of the ethmoid air cells. There is increased membrane thickening and bilateral small low-density fluid levels in the maxillary sinuses. The frontal and sphenoid sinus are clear.  Nasal septum is S shaped. Other: None. CT CERVICAL FINDINGS Alignment: Chronic trace degenerative anterolisthesis C3-4, unchanged, and also again noted mild reversed cervical lordosis. There is bone-on-bone anterior atlantodental joint space loss, with small osteophytes, as before. No new or acute alignment abnormality. Skull base and vertebrae: Patient motion limits the sensitivity of the study. No spinal compression fracture or obvious displaced fracture seen. Osteopenia is noted but no other primary pathologic process of bone is seen. Soft tissues and spinal canal: Limited due to motion. No prevertebral fluid or soft tissue swelling. No obvious canal hematoma. The proximal cervical ICAs are heavily calcified. No thyroid or laryngeal masses seen. Disc levels: Chronic disc collapse C4-5 through C6-7. There are small bidirectional osteophytes but no significant soft tissue or bony encroachment on the  thecal sac. Other cervical discs are normal in height. There are facet joint and uncinate osteophytes at all levels. There is moderate to severe degenerative foraminal stenosis at C3-4, but elsewhere no significant foraminal encroachment. Other: None. CT CHEST FINDINGS Cardiovascular: The cardiac size is normal. 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 Stable dilatation of the pulmonary trunk, 3.7 cm indicating chronic arterial hypertension. Aortic atherosclerosis and scattered plaque in the great vessels is also again noted as well as stable ascending aortic dilatation to 4.3 cm. Pulmonary veins are decompressed. Mediastinum/Nodes: No intrathoracic, supraclavicular or axillary adenopathy. Small shotty subcarinal lymph nodes are unchanged. There are calcified right paratracheal nodes chronically. There is no thyroid mass, tracheal or main bronchi filling defect, or esophageal thickening and no hiatal hernia. Lungs/Pleura: Chronic severe elevation left hemidiaphragm consistent with hernia, paresis or large eventration. The dome reaching as high as the aortic arch and there is chronic overlying atelectasis in the left upper lobe. In the left lower lobe there are chronic compressive changes due to elevated hemidiaphragm or herniation, particularly through the basal segments, but today there is increased opacity which could indicate a superimposed pneumonic infiltrate as compared to the prior studies. There are scattered air bronchograms in the area. The left lower lobe superior segment remains clear. There are few linear scar-like opacities in the left upper lobe but no infiltrate. There is posterior atelectasis in the right lower lobe but no infiltrate. No nodule is seen. There is no pleural effusion, thickening or pneumothorax. There are small calcified pleural plaques in the posterior basal left chest, calcified right upper lobe granuloma posteriorly on 5:43. Musculoskeletal: There is  mild chronic wedging of the T12 vertebral body, osteopenia and degenerative change in the lower thoracic spine. There are healed left rib cage fractures but no acute rib fracture is evident. The sternum is intact. No abnormal focal osseous lesions. CT ABDOMEN AND PELVIS FINDINGS Hepatobiliary: There are scattered calcified hepatic granulomas. Without contrast no hepatic injury or perihepatic hematoma is seen. There is old cholecystectomy without biliary dilatation. Pancreas: No focal abnormality is seen without contrast. Respiratory motion limits fine detail. Spleen: Intrathoracic. No focal abnormality is seen without contrast apart from scattered calcified granulomas. No splenomegaly. Limited fine detail due  to breathing motion and streak artifact from the patient's arms in the field. Adrenals/Urinary Tract: There is no adrenal mass. There are numerous bilateral thin walled homogeneous renal cysts including several large cysts, largest on the left is 8.1 cm, largest on the right is in the upper pole approaching 9 cm. The larger of the cysts are slightly larger than in 2015 but no suspicious abnormality is seen in the unenhanced kidneys. There is no urinary stone or obstruction. There is no bladder thickening. Stomach/Bowel: The stomach is intrathoracic as are the distal transverse and proximal descending colon. The gastric thickness is normal. Small bowel is diffusely dilated up to 3.7 cm except in the right mid to lower abdomen where the distal ileal segments and terminal ileum are decompressed. The focal transition from dilated to decompressed caliber is not identified. The appendix is normal. The large bowel wall is unremarkable proximally. The descending and sigmoid colon show diverticula with wall thickening and stranding alongside the distal descending colon series 3 axial images 78-101, findings consistent with a mild acute distal descending diverticulitis. The sigmoid diverticulosis is uncomplicated. There  is moderate retained stool in the distal sigmoid and rectum. No wall thickening in the rectum. Vascular/Lymphatic: Moderate aortoiliac calcific plaque. Interval slight dilatation in the distal infrarenal aorta measuring 2.7 cm. Follow-up imaging at 5 year intervals is recommended. No adenopathy is seen. Reproductive: No prostatomegaly. Central prostatic calcifications are present and there appears to be a TURP defect. Other: There has no free air, free hemorrhage, free fluid or incarcerated hernia. Musculoskeletal: There is levoscoliosis and degenerative changes of the lumbar spine. No spinal compression fracture or other acute regional osseous findings. Bridging osteophytes anterior right SI joint are noted. Mild-to-moderate left and mild right hip DJD. No surface hematoma is seen. IMPRESSION: 1. No acute intracranial CT findings or depressed skull fractures. Chronic change. 2. Left mastoid effusion, with diffuse right mastoid or middle ear opacification and chronic attenuated and small right ossicular chain. This appears to have been present in 2019 also. 3. Osteopenia and degenerative change without evidence of cervical fractures or traumatic listhesis. 4. 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 5. Increasing opacity in the basal segments of the left lower lobe which could be due to a superimposed pneumonic infiltrate versus increased compressive changes from the elevated hemidiaphragm. 6. Increasing ethmoid and maxillary sinus disease with small fluid levels in the maxillary sinuses. Correlate clinically for acute sinusitis. 7. No acute trauma related findings in the chest, abdomen or pelvis. 8. 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 9. Stable dilatation of the pulmonary trunk indicating chronic arterial hypertension. 10. Stable 4.3 cm ascending aortic dilatation. Annual CTA or MRA follow-up recommended. 11. Mild acute distal  descending diverticulitis. No free air, free fluid or abscess. 12. Small-bowel obstruction with dilatation to 3.7 cm. The distal ileal segments are decompressed. Etiology could be occult adhesions, occult internal hernia, enteritis or occult mass. A focal transition could not be found. 13. Numerous bilateral renal cysts. 14. Interval slight dilatation in the distal infrarenal aorta measuring 2.7 cm. Follow-up imaging at 5 year intervals is recommended. Aortic Atherosclerosis (ICD10-I70.0). Electronically Signed   By: Telford Nab M.D.   On: 10/02/2022 22:53   CT CHEST ABDOMEN PELVIS WO CONTRAST  Result Date: 10/02/2022 CLINICAL DATA:  The patient found down after an unwitnessed fall, reportedly was on the floor for 10 hours before discovery. Suspected blunt polytrauma but denies hitting head. EXAM: CT HEAD  WITHOUT CONTRAST CT CERVICAL SPINE WITHOUT CONTRAST CT CHEST, ABDOMEN AND PELVIS WITHOUT CONTRAST TECHNIQUE: Contiguous axial images were obtained from the base of the skull through the vertex without intravenous contrast. Multidetector CT imaging of the cervical spine was performed without intravenous contrast. Multiplanar CT image reconstructions were also generated. Multidetector CT imaging of the chest, abdomen and pelvis was performed following the standard protocol without IV contrast. RADIATION DOSE REDUCTION: This exam was performed according to the departmental dose-optimization program which includes automated exposure control, adjustment of the mA and/or kV according to patient size and/or use of iterative reconstruction technique. COMPARISON:  Last brain study was MRI brain 06/18/2018, last neck study with CT angiography neck 06/18/2018, with comparison made to chest CT without contrast 10/06/2021 and an abdomen and pelvis CT with contrast of 05/22/2014. FINDINGS: CT HEAD FINDINGS Brain: There is mild global atrophy for age, mild small-vessel disease of the cerebral white matter, and chronic  lacunar infarcts in both gangliocapsular areas left frontoparietal white matter near the vertex. No acute infarct, hemorrhage or mass is seen. Ventricles are normal in size and position. There are benign dural calcifications scattered along side of the falx. Vascular: There are patchy calcifications both carotid siphons, left vertebral artery and basilar artery. No hyperdense central vessel. Skull: No scalp hematoma is seen. The calvarium, skull base and orbits are intact. There is a chronic fracture deformity of the nasal bone. Sinuses/Orbits: Unremarkable orbits and orbital contents. There is diffuse fluid opacification of the right mastoid air cells and middle ear cavity and a small attenuated right ossicular chain. This appears to have been present previously. Moderate patchy fluid in the lower left mastoid air cells is also similar. There is worsening multifocal opacification of the ethmoid air cells. There is increased membrane thickening and bilateral small low-density fluid levels in the maxillary sinuses. The frontal and sphenoid sinus are clear.  Nasal septum is S shaped. Other: None. CT CERVICAL FINDINGS Alignment: Chronic trace degenerative anterolisthesis C3-4, unchanged, and also again noted mild reversed cervical lordosis. There is bone-on-bone anterior atlantodental joint space loss, with small osteophytes, as before. No new or acute alignment abnormality. Skull base and vertebrae: Patient motion limits the sensitivity of the study. No spinal compression fracture or obvious displaced fracture seen. Osteopenia is noted but no other primary pathologic process of bone is seen. Soft tissues and spinal canal: Limited due to motion. No prevertebral fluid or soft tissue swelling. No obvious canal hematoma. The proximal cervical ICAs are heavily calcified. No thyroid or laryngeal masses seen. Disc levels: Chronic disc collapse C4-5 through C6-7. There are small bidirectional osteophytes but no significant  soft tissue or bony encroachment on the thecal sac. Other cervical discs are normal in height. There are facet joint and uncinate osteophytes at all levels. There is moderate to severe degenerative foraminal stenosis at C3-4, but elsewhere no significant foraminal encroachment. Other: None. CT CHEST FINDINGS Cardiovascular: The cardiac size is normal. 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 Stable dilatation of the pulmonary trunk, 3.7 cm indicating chronic arterial hypertension. Aortic atherosclerosis and scattered plaque in the great vessels is also again noted as well as stable ascending aortic dilatation to 4.3 cm. Pulmonary veins are decompressed. Mediastinum/Nodes: No intrathoracic, supraclavicular or axillary adenopathy. Small shotty subcarinal lymph nodes are unchanged. There are calcified right paratracheal nodes chronically. There is no thyroid mass, tracheal or main bronchi filling defect, or esophageal thickening and no hiatal hernia. Lungs/Pleura: Chronic severe elevation left hemidiaphragm consistent  with hernia, paresis or large eventration. The dome reaching as high as the aortic arch and there is chronic overlying atelectasis in the left upper lobe. In the left lower lobe there are chronic compressive changes due to elevated hemidiaphragm or herniation, particularly through the basal segments, but today there is increased opacity which could indicate a superimposed pneumonic infiltrate as compared to the prior studies. There are scattered air bronchograms in the area. The left lower lobe superior segment remains clear. There are few linear scar-like opacities in the left upper lobe but no infiltrate. There is posterior atelectasis in the right lower lobe but no infiltrate. No nodule is seen. There is no pleural effusion, thickening or pneumothorax. There are small calcified pleural plaques in the posterior basal left chest, calcified right upper lobe granuloma  posteriorly on 5:43. Musculoskeletal: There is mild chronic wedging of the T12 vertebral body, osteopenia and degenerative change in the lower thoracic spine. There are healed left rib cage fractures but no acute rib fracture is evident. The sternum is intact. No abnormal focal osseous lesions. CT ABDOMEN AND PELVIS FINDINGS Hepatobiliary: There are scattered calcified hepatic granulomas. Without contrast no hepatic injury or perihepatic hematoma is seen. There is old cholecystectomy without biliary dilatation. Pancreas: No focal abnormality is seen without contrast. Respiratory motion limits fine detail. Spleen: Intrathoracic. No focal abnormality is seen without contrast apart from scattered calcified granulomas. No splenomegaly. Limited fine detail due to breathing motion and streak artifact from the patient's arms in the field. Adrenals/Urinary Tract: There is no adrenal mass. There are numerous bilateral thin walled homogeneous renal cysts including several large cysts, largest on the left is 8.1 cm, largest on the right is in the upper pole approaching 9 cm. The larger of the cysts are slightly larger than in 2015 but no suspicious abnormality is seen in the unenhanced kidneys. There is no urinary stone or obstruction. There is no bladder thickening. Stomach/Bowel: The stomach is intrathoracic as are the distal transverse and proximal descending colon. The gastric thickness is normal. Small bowel is diffusely dilated up to 3.7 cm except in the right mid to lower abdomen where the distal ileal segments and terminal ileum are decompressed. The focal transition from dilated to decompressed caliber is not identified. The appendix is normal. The large bowel wall is unremarkable proximally. The descending and sigmoid colon show diverticula with wall thickening and stranding alongside the distal descending colon series 3 axial images 78-101, findings consistent with a mild acute distal descending diverticulitis. The  sigmoid diverticulosis is uncomplicated. There is moderate retained stool in the distal sigmoid and rectum. No wall thickening in the rectum. Vascular/Lymphatic: Moderate aortoiliac calcific plaque. Interval slight dilatation in the distal infrarenal aorta measuring 2.7 cm. Follow-up imaging at 5 year intervals is recommended. No adenopathy is seen. Reproductive: No prostatomegaly. Central prostatic calcifications are present and there appears to be a TURP defect. Other: There has no free air, free hemorrhage, free fluid or incarcerated hernia. Musculoskeletal: There is levoscoliosis and degenerative changes of the lumbar spine. No spinal compression fracture or other acute regional osseous findings. Bridging osteophytes anterior right SI joint are noted. Mild-to-moderate left and mild right hip DJD. No surface hematoma is seen. IMPRESSION: 1. No acute intracranial CT findings or depressed skull fractures. Chronic change. 2. Left mastoid effusion, with diffuse right mastoid or middle ear opacification and chronic attenuated and small right ossicular chain. This appears to have been present in 2019 also. 3. Osteopenia and degenerative change without evidence  of cervical fractures or traumatic listhesis. 4. 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 5. Increasing opacity in the basal segments of the left lower lobe which could be due to a superimposed pneumonic infiltrate versus increased compressive changes from the elevated hemidiaphragm. 6. Increasing ethmoid and maxillary sinus disease with small fluid levels in the maxillary sinuses. Correlate clinically for acute sinusitis. 7. No acute trauma related findings in the chest, abdomen or pelvis. 8. 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 9. Stable dilatation of the pulmonary trunk indicating chronic arterial hypertension. 10. Stable 4.3 cm ascending aortic dilatation. Annual CTA or MRA  follow-up recommended. 11. Mild acute distal descending diverticulitis. No free air, free fluid or abscess. 12. Small-bowel obstruction with dilatation to 3.7 cm. The distal ileal segments are decompressed. Etiology could be occult adhesions, occult internal hernia, enteritis or occult mass. A focal transition could not be found. 13. Numerous bilateral renal cysts. 14. Interval slight dilatation in the distal infrarenal aorta measuring 2.7 cm. Follow-up imaging at 5 year intervals is recommended. Aortic Atherosclerosis (ICD10-I70.0). Electronically Signed   By: Telford Nab M.D.   On: 10/02/2022 22:53   DG Shoulder Right  Result Date: 10/02/2022 CLINICAL DATA:  [place], found down EXAM: RIGHT SHOULDER - 2+ VIEW COMPARISON:  None Available. FINDINGS: Internal rotation, external rotation, and transscapular views of the right shoulder are obtained. No fracture, subluxation, or dislocation. Moderate hypertrophic changes of the acromioclavicular joint. Soft tissues are unremarkable. Right chest is clear. IMPRESSION: 1. No acute displaced fracture. 2. Moderate acromioclavicular joint osteoarthropathy. Electronically Signed   By: Randa Ngo M.D.   On: 10/02/2022 19:19   DG Pelvis Portable  Result Date: 10/02/2022 CLINICAL DATA:  [place], found down EXAM: PORTABLE PELVIS 1-2 VIEWS COMPARISON:  None Available. FINDINGS: 2 supine frontal views of the pelvis are obtained, including both hips. No acute displaced fracture. Alignment is anatomic. Mild symmetrical bilateral hip osteoarthritis. Soft tissues are unremarkable. IMPRESSION: 1. No acute displaced fracture. Electronically Signed   By: Randa Ngo M.D.   On: 10/02/2022 19:18   DG Chest Port 1 View  Result Date: 10/02/2022 CLINICAL DATA:  [place], found down EXAM: PORTABLE CHEST 1 VIEW COMPARISON:  11/22/2015 FINDINGS: Single frontal view of the chest demonstrates chronic elevation of the left hemidiaphragm. Cardiac silhouette is stable, with postsurgical  changes from prior CABG. Chronic atelectasis at the left lung base. No airspace disease, effusion, or pneumothorax. Left posterolateral sixth and seventh rib fractures are identified, age indeterminate. No other bony abnormalities. IMPRESSION: 1. Age indeterminate left posterolateral sixth and seventh rib fractures. Please correlate with site of patient's pain. 2. Chronic elevation left hemidiaphragm.  No acute airspace disease. Electronically Signed   By: Randa Ngo M.D.   On: 10/02/2022 19:17     A/P: 89 year old gentleman with multiple medical problems who likely sustained a ground-level fall and was down for several hours.  No traumatic injuries noted but possible small bowel obstruction and mild diverticulitis noted on his trauma scans.  He is distended and he is mildly tender in the right hemiabdomen in the left lower quadrant without any peritoneal signs.  He is being admitted to the medical service for further workup and resuscitation.  If he does endorse nausea or active vomiting, would recommend NG tube.  At this point would hold off.  Would limit p.o. intake to sips with medication and ice chips.  Will continue to follow at this point.  Patient Active Problem List   Diagnosis Date Noted   Acute CVA (cerebrovascular accident) ([place]) 06/19/2018   CVA (cerebral vascular accident) ([place]) 06/18/2018   Thoracic aortic aneurysm without rupture ([place]) 01/18/2018   Ascending aorta dilatation ([place]) 01/18/2017   Essential hypertension 07/24/2014   History of DVT (deep vein thrombosis) 07/24/2014   DVT, lower extremity ([place]) 07/23/2014   CAD (coronary artery disease) 11/18/2013   S/P CABG x 3 11/18/2013   Cardiomyopathy, ischemic 11/18/2013   Dyslipidemia 11/18/2013   CKD (chronic kidney disease) stage 3, GFR 30-59 ml/min ([facility]) 11/18/2013   Impaired fasting glucose 11/18/2013       Romana Juniper, MD [facility]  See AMION to contact appropriate on-call provider

## 2022-10-02 NOTE — ED Provider Notes (Signed)
[facility] EMERGENCY DEPARTMENT Provider Note   CSN: [id number] Arrival date & time: 10/02/22  1751     History  Chief Complaint  Patient presents with   Patrick Giles is a 89 y.o. male.  HPI   89 year old male with past medical history of  CAD status post CABG, HTN, HLD, CVA, h/o afib, DVT on Eliquis, thoracic aortic aneurysm presents to the emergency department after being found down for about 10 hours.  Patient states he woke up this morning around 5 AM to use the restroom.  He reports walking into the bathroom and then woke up on the ground.  He states he tried to crawl back to his room and get off the ground but he was unable to.  EMS found the patient cold, hypotensive, got IV fluids and route.  Patient states he feels cold and weak with mild right shoulder pain but denies any other acute complaints.  He is not sure if he hit his head.  There was no incontinence or tongue biting or other seizure-like activity witnessed.  Patient states he was in his usual state of health last night, has been compliant with his medication.  No recent fever or acute illness.  Home Medications Prior to Admission medications   Medication Sig Start Date End Date Taking? Authorizing Provider  amLODipine-benazepril (LOTREL) 5-20 MG capsule OFFICE VISIT NEEDED FOR FUTURE REFILLS Patient taking differently: Take 1 capsule by mouth daily. OFFICE VISIT NEEDED FOR FUTURE REFILLS 08/17/21  Yes Hilty, Nadean Corwin, MD  apixaban (ELIQUIS) 2.5 MG TABS tablet TAKE 1 TABLET BY MOUTH TWICE A DAY Patient taking differently: Take 2.5 mg by mouth 2 (two) times daily. 01/06/22  Yes Hilty, Nadean Corwin, MD  atorvastatin (LIPITOR) 40 MG tablet TAKE 1 TABLET BY MOUTH EVERY DAY AT 6PM Patient taking differently: Take 40 mg by mouth daily. 02/19/18  Yes Hilty, Nadean Corwin, MD  GARLIC PO Take 0,865 mg by mouth daily.    Yes [provider]  metoprolol succinate (TOPROL-XL) 25 MG 24 hr tablet Take 25  mg by mouth daily.   Yes [provider]  Multiple Vitamins-Minerals (ALIVE MENS ENERGY PO) Take 1 tablet by mouth daily.    Yes [provider]  Omega-3 Fatty Acids (FISH OIL) 1200 MG CAPS Take 2 capsules by mouth daily.   Yes [provider]      Allergies    Patient has no known allergies.    Review of Systems   Review of Systems  Constitutional:  Positive for fatigue. Negative for fever.  Respiratory:  Negative for shortness of breath.   Cardiovascular:  Negative for chest pain.  Gastrointestinal:  Negative for abdominal pain, diarrhea and vomiting.  Musculoskeletal:  Negative for back pain and neck pain.       + right shoulder pain  Skin:  Negative for rash.  Neurological:  Negative for dizziness, weakness, light-headedness, numbness and headaches.    Physical Exam Updated Vital Signs BP 137/62   Pulse (!) 112   Temp (!) 97.2 F (36.2 C) (Rectal)   Resp (!) 29   Ht 5\' 11"  (1.803 m)   Wt 88 kg   SpO2 92%   BMI 27.06 kg/m  Physical Exam Vitals and nursing note reviewed.  Constitutional:      Appearance: Normal appearance.  HENT:     Head: Normocephalic.     Right Ear: External ear normal.     Left Ear: External  ear normal.     Mouth/Throat:     Mouth: Mucous membranes are moist.  Eyes:     Extraocular Movements: Extraocular movements intact.     Pupils: Pupils are equal, round, and reactive to light.  Cardiovascular:     Rate and Rhythm: Tachycardia present.  Pulmonary:     Effort: Pulmonary effort is normal. No respiratory distress.  Abdominal:     Palpations: Abdomen is soft.     Tenderness: There is no abdominal tenderness. There is no guarding or rebound.  Musculoskeletal:        General: No swelling or deformity.  Skin:    Coloration: Skin is pale.     Comments: cold  Neurological:     Mental Status: He is alert and oriented to person, place, and time. Mental status is at baseline.  Psychiatric:        Mood and Affect:  Mood normal.     ED Results / Procedures / Treatments   Labs (all labs ordered are listed, but only abnormal results are displayed) Labs Reviewed  CBC WITH DIFFERENTIAL/PLATELET  COMPREHENSIVE METABOLIC PANEL  CK    EKG None  Radiology No results found.  Procedures .Critical Care  Performed by: Rozelle Logan, DO Authorized by: Rozelle Logan, DO   Critical care provider statement:    Critical care time (minutes):  30   Critical care time was exclusive of:  Separately billable procedures and treating other patients   Critical care was necessary to treat or prevent imminent or life-threatening deterioration of the following conditions:  Renal failure   Critical care was time spent personally by me on the following activities:  Development of treatment plan with patient or surrogate, discussions with consultants, evaluation of patient's response to treatment, examination of patient, ordering and review of laboratory studies, ordering and review of radiographic studies, ordering and performing treatments and interventions, pulse oximetry, re-evaluation of patient's condition and review of old charts   I assumed direction of critical care for this patient from another provider in my specialty: no       Medications Ordered in ED Medications  sodium chloride 0.9 % bolus 500 mL (has no administration in time range)    ED Course/ Medical Decision Making/ A&P                             Medical Decision Making Amount and/or Complexity of Data Reviewed Labs: ordered. Radiology: ordered.  Risk Prescription drug management. Decision regarding hospitalization.   89 year old male presents emergency department after presumed syncopal episode in the bathroom this morning with prolonged downtime on the floor.  Patient was remembers walking to the bathroom at 5 AM and then woke up on the ground.  No incontinence, tongue biting or seizure-like activity.  Patient states he was  too weak to get off the ground.  He states he is otherwise been compliant with his medications, specifically anticoagulation.  Patient is tachycardic on arrival.  EKG looks like sinus rhythm with arrhythmia.  Reviewed EKG with on-call cardiology fellow Dr. Okey Dupre who agrees.  After fluid hydration and a dose of beta-blocker which is in his home medication list heart rate is improved and better controlled.  He is mainly complaining of mild head discomfort.  Denies any acute chest, back or abdominal pain.  All extremities appear well-perfused.  In the absence of chest/back pain I have lower suspicion for aortic dissection at this time  although it is in the differential.  Lower suspicion for pulmonary embolism as well as he is currently anticoagulated, no hypoxia.  Initial goal was to get a CT of the chest abdomen pelvis however renal function will not support this.  Blood work shows worsening kidney dysfunction.  CK is normal, mild leukocytosis.  Will plan for hydration, rate control and admission in regards to the kidney dysfunction.  Patient signed out pending CT imaging to rule out traumatic injury. Patient signed out to Dr. Mayra Neer.        Final Clinical Impression(s) / ED Diagnoses Final diagnoses:  None    Rx / DC Orders ED Discharge Orders     None         Lorelle Gibbs, DO 10/02/22 2202

## 2022-10-02 NOTE — ED Triage Notes (Signed)
Patient bib [facility] from home after a fall. He was down for 10 hours in the the floor and denies hitting his head or LOC. Upon ems arrival to scene patient was hypotensive 90/40 and complained of being cold. EMS gave normal saline and patients blood pressure came up to normal range. He arrived to the ED shivering and cold to the touch, tachycardic, and with tachypnea. His abdomen is also distended and rigid on the right side. Patient has a history of AAA, and takes eliquis.

## 2022-10-02 NOTE — ED Notes (Signed)
Patient transported to CT 

## 2022-10-02 NOTE — ED Notes (Signed)
Notified primary RN of pt's critical troponin 277.

## 2022-10-02 NOTE — H&P (Incomplete)
Date: 10/02/2022               Patient Name:  Patrick Giles MRN: [id number]  DOB: 18-Apr-1933 Age / Sex: 89 y.o., male   PCP: Chesley Noon, MD         Medical Service: Internal Medicine Teaching Service         Attending Physician: Dr. Audley Hose, MD      First Contact: {Intern/Pager23/24:[id number]}    Second Contact: {Resident/Pager23/24:28386}         After Hours (After 5p/  First Contact Pager: [phone]  weekends / holidays): Second Contact Pager: [phone]   SUBJECTIVE   Chief Complaint: ***  History of Present Illness: ***  ED Course:  Meds:  Current Meds  Medication Sig   amLODipine-benazepril (LOTREL) 5-20 MG capsule OFFICE VISIT NEEDED FOR FUTURE REFILLS (Patient taking differently: Take 1 capsule by mouth daily. OFFICE VISIT NEEDED FOR FUTURE REFILLS)   apixaban (ELIQUIS) 2.5 MG TABS tablet TAKE 1 TABLET BY MOUTH TWICE A DAY (Patient taking differently: Take 2.5 mg by mouth 2 (two) times daily.)   atorvastatin (LIPITOR) 40 MG tablet TAKE 1 TABLET BY MOUTH EVERY DAY AT 6PM (Patient taking differently: Take 40 mg by mouth daily.)   GARLIC PO Take 7,322 mg by mouth daily.    metoprolol succinate (TOPROL-XL) 25 MG 24 hr tablet Take 25 mg by mouth daily.   Multiple Vitamins-Minerals (ALIVE MENS ENERGY PO) Take 1 tablet by mouth daily.    Omega-3 Fatty Acids (FISH OIL) 1200 MG CAPS Take 2 capsules by mouth daily.    Past Medical History  Past Surgical History:  Procedure Laterality Date   CHOLECYSTECTOMY  1998   CORONARY ARTERY BYPASS GRAFT  07/08/1997   LIMA to LAD, reverse SVG to PDA, reverse SVG to first diagonal (Dr. Festus Barren)   TRANSTHORACIC ECHOCARDIOGRAM  11/29/2012   EF 02-54%, grade 1 diastolic dysfunction    Social:  Lives With: Occupation: Support: Level of Function: PCP: Substances:  Family History: ***  Allergies: Allergies as of 10/02/2022   (No Known Allergies)    Review of Systems: A complete ROS was negative except as per HPI.    OBJECTIVE:   Physical Exam: Blood pressure 125/68, pulse 99, temperature 98.1 F (36.7 C), temperature source Oral, resp. rate (!) 28, height 5\' 11"  (1.803 m), weight 88 kg, SpO2 95 %.  Constitutional: well-appearing *** sitting in ***, in no acute distress HENT: normocephalic atraumatic, mucous membranes moist Eyes: conjunctiva non-erythematous Neck: supple Cardiovascular: regular rate and rhythm, no m/r/g Pulmonary/Chest: normal work of breathing on room air, lungs clear to auscultation bilaterally Abdominal: soft, non-tender, non-distended MSK: normal bulk and tone Neurological: alert & oriented x 3, 5/5 strength in bilateral upper and lower extremities, normal gait Skin: warm and dry Psych: ***  Labs: CBC    Component Value Date/Time   WBC 13.2 (H) 10/02/2022 1837   RBC 4.42 10/02/2022 1837   HGB 13.5 10/02/2022 1837   HCT 41.6 10/02/2022 1837   PLT 134 (L) 10/02/2022 1837   MCV 94.1 10/02/2022 1837   MCV 85.4 11/22/2015 1345   MCH 30.5 10/02/2022 1837   MCHC 32.5 10/02/2022 1837   RDW 12.7 10/02/2022 1837   LYMPHSABS 0.6 (L) 10/02/2022 1837   MONOABS 0.8 10/02/2022 1837   EOSABS 0.0 10/02/2022 1837   BASOSABS 0.0 10/02/2022 1837     CMP     Component Value Date/Time   NA 137 10/02/2022 1837   NA  138 06/21/2019 1407   K 4.7 10/02/2022 1837   CL 106 10/02/2022 1837   CO2 17 (L) 10/02/2022 1837   GLUCOSE 169 (H) 10/02/2022 1837   BUN 37 (H) 10/02/2022 1837   BUN 28 (H) 06/21/2019 1407   CREATININE 2.82 (H) 10/02/2022 1837   CREATININE 1.83 (H) 01/11/2016 0955   CALCIUM 9.2 10/02/2022 1837   PROT 5.8 (L) 10/02/2022 1837   ALBUMIN 3.0 (L) 10/02/2022 1837   AST 48 (H) 10/02/2022 1837   ALT 24 10/02/2022 1837   ALKPHOS 94 10/02/2022 1837   BILITOT 1.0 10/02/2022 1837   GFRNONAA 21 (L) 10/02/2022 1837   GFRNONAA 41 (L) 09/16/2015 1202   GFRAA 39 (L) 06/21/2019 1407   GFRAA 47 (L) 09/16/2015 1202    Imaging:  EKG: personally reviewed my interpretation  is***. Prior EKG***  ASSESSMENT & PLAN:   Assessment & Plan by Problem: Active Problems:   * No active hospital problems. *   CHELSEY KIMBERLEY is a 89 y.o. person living with a history of *** who presented with *** and admitted for *** on hospital day 0  *** ***  *** ***  *** ***  Diet: {NAMES:3044014::"Normal","Heart Healthy","Carb-Modified","Renal","Carb/Renal","NPO","TPN","Tube Feeds"} VTE: {NAMES:3044014::"Heparin","Enoxaparin","SCDs","DOAC","None"} IVF: {NAMES:3044014::"None","NS","1/2 NS","LR","D5","D10"},{NAMES:3044014::"None","10cc/hr","25cc/hr","50cc/hr","75cc/hr","100cc/hr","110cc/hr","125cc/hr","Bolus"} Code: {NAMES:3044014::"Full","DNR","DNI","DNR/DNI","Comfort Care","Unknown"}  Prior to Admission Living Arrangement: {NAMES:3044014::"Home, living ***","SNF, ***","Homeless","***"} Anticipated Discharge Location: {NAMES:3044014::"Home","SNF","CIR","***"} Barriers to Discharge: ***  Dispo: Admit patient to {STATUS:3044014::"Observation with expected length of stay less than 2 midnights.","Inpatient with expected length of stay greater than 2 midnights."}  Signed: Leigh Aurora, DO Internal Medicine Resident PGY-1 Pager: [phone] 10/02/2022, 11:37 PM

## 2022-10-02 NOTE — Hospital Course (Addendum)
Principal Problem:   Diarrhea Active Problems:   Dyslipidemia   CKD (chronic kidney disease), stage III (HCC)   Diverticulosis of colon   Weakness   AKI (acute kidney injury) ([place])   Atrial fibrillation (HCC)   Thrombocytopenia (HCC)  Resolved Problems:   Fall   Fever  Consults: surgery  Procedures:***  Follow-up items: face to face for home health signed, depression

## 2022-10-03 DIAGNOSIS — I959 Hypotension, unspecified: Secondary | ICD-10-CM | POA: Diagnosis present

## 2022-10-03 DIAGNOSIS — Z951 Presence of aortocoronary bypass graft: Secondary | ICD-10-CM | POA: Diagnosis not present

## 2022-10-03 DIAGNOSIS — R509 Fever, unspecified: Secondary | ICD-10-CM

## 2022-10-03 DIAGNOSIS — E860 Dehydration: Secondary | ICD-10-CM | POA: Diagnosis present

## 2022-10-03 DIAGNOSIS — W19XXXA Unspecified fall, initial encounter: Secondary | ICD-10-CM | POA: Diagnosis not present

## 2022-10-03 DIAGNOSIS — E872 Acidosis, unspecified: Secondary | ICD-10-CM | POA: Diagnosis present

## 2022-10-03 DIAGNOSIS — N1832 Chronic kidney disease, stage 3b: Secondary | ICD-10-CM | POA: Diagnosis present

## 2022-10-03 DIAGNOSIS — R112 Nausea with vomiting, unspecified: Secondary | ICD-10-CM | POA: Diagnosis present

## 2022-10-03 DIAGNOSIS — I69341 Monoplegia of lower limb following cerebral infarction affecting right dominant side: Secondary | ICD-10-CM | POA: Diagnosis not present

## 2022-10-03 DIAGNOSIS — D649 Anemia, unspecified: Secondary | ICD-10-CM | POA: Diagnosis present

## 2022-10-03 DIAGNOSIS — J398 Other specified diseases of upper respiratory tract: Secondary | ICD-10-CM | POA: Diagnosis present

## 2022-10-03 DIAGNOSIS — N179 Acute kidney failure, unspecified: Secondary | ICD-10-CM | POA: Diagnosis present

## 2022-10-03 DIAGNOSIS — Z86718 Personal history of other venous thrombosis and embolism: Secondary | ICD-10-CM | POA: Diagnosis not present

## 2022-10-03 DIAGNOSIS — Y92009 Unspecified place in unspecified non-institutional (private) residence as the place of occurrence of the external cause: Secondary | ICD-10-CM | POA: Diagnosis not present

## 2022-10-03 DIAGNOSIS — W1811XA Fall from or off toilet without subsequent striking against object, initial encounter: Secondary | ICD-10-CM | POA: Diagnosis present

## 2022-10-03 DIAGNOSIS — R140 Abdominal distension (gaseous): Secondary | ICD-10-CM | POA: Diagnosis present

## 2022-10-03 DIAGNOSIS — E785 Hyperlipidemia, unspecified: Secondary | ICD-10-CM | POA: Diagnosis present

## 2022-10-03 DIAGNOSIS — K56600 Partial intestinal obstruction, unspecified as to cause: Secondary | ICD-10-CM | POA: Diagnosis present

## 2022-10-03 DIAGNOSIS — D696 Thrombocytopenia, unspecified: Secondary | ICD-10-CM | POA: Diagnosis present

## 2022-10-03 DIAGNOSIS — I4891 Unspecified atrial fibrillation: Secondary | ICD-10-CM | POA: Insufficient documentation

## 2022-10-03 DIAGNOSIS — Y92012 Bathroom of single-family (private) house as the place of occurrence of the external cause: Secondary | ICD-10-CM | POA: Diagnosis not present

## 2022-10-03 DIAGNOSIS — I7121 Aneurysm of the ascending aorta, without rupture: Secondary | ICD-10-CM | POA: Diagnosis present

## 2022-10-03 DIAGNOSIS — I2510 Atherosclerotic heart disease of native coronary artery without angina pectoris: Secondary | ICD-10-CM | POA: Diagnosis present

## 2022-10-03 DIAGNOSIS — R197 Diarrhea, unspecified: Secondary | ICD-10-CM | POA: Diagnosis not present

## 2022-10-03 DIAGNOSIS — R531 Weakness: Secondary | ICD-10-CM

## 2022-10-03 DIAGNOSIS — K5732 Diverticulitis of large intestine without perforation or abscess without bleeding: Secondary | ICD-10-CM | POA: Diagnosis present

## 2022-10-03 DIAGNOSIS — I129 Hypertensive chronic kidney disease with stage 1 through stage 4 chronic kidney disease, or unspecified chronic kidney disease: Secondary | ICD-10-CM | POA: Diagnosis present

## 2022-10-03 DIAGNOSIS — K567 Ileus, unspecified: Secondary | ICD-10-CM | POA: Diagnosis present

## 2022-10-03 DIAGNOSIS — M8580 Other specified disorders of bone density and structure, unspecified site: Secondary | ICD-10-CM | POA: Diagnosis present

## 2022-10-03 DIAGNOSIS — K5730 Diverticulosis of large intestine without perforation or abscess without bleeding: Secondary | ICD-10-CM | POA: Insufficient documentation

## 2022-10-03 DIAGNOSIS — E861 Hypovolemia: Secondary | ICD-10-CM | POA: Diagnosis present

## 2022-10-03 DIAGNOSIS — I482 Chronic atrial fibrillation, unspecified: Secondary | ICD-10-CM | POA: Insufficient documentation

## 2022-10-03 DIAGNOSIS — Z66 Do not resuscitate: Secondary | ICD-10-CM | POA: Diagnosis present

## 2022-10-03 LAB — COMPREHENSIVE METABOLIC PANEL
ALT: 20 U/L (ref 0–44)
AST: 35 U/L (ref 15–41)
Albumin: 2.3 g/dL — ABNORMAL LOW (ref 3.5–5.0)
Alkaline Phosphatase: 66 U/L (ref 38–126)
Anion gap: 8 (ref 5–15)
BUN: 36 mg/dL — ABNORMAL HIGH (ref 8–23)
CO2: 19 mmol/L — ABNORMAL LOW (ref 22–32)
Calcium: 7.5 mg/dL — ABNORMAL LOW (ref 8.9–10.3)
Chloride: 109 mmol/L (ref 98–111)
Creatinine, Ser: 2.38 mg/dL — ABNORMAL HIGH (ref 0.61–1.24)
GFR, Estimated: 26 mL/min — ABNORMAL LOW (ref >60)
Glucose, Bld: 139 mg/dL — ABNORMAL HIGH (ref 70–99)
Potassium: 3.9 mmol/L (ref 3.5–5.1)
Sodium: 136 mmol/L (ref 135–145)
Total Bilirubin: 0.6 mg/dL (ref 0.3–1.2)
Total Protein: 4.6 g/dL — ABNORMAL LOW (ref 6.5–8.1)

## 2022-10-03 LAB — URINALYSIS, ROUTINE W REFLEX MICROSCOPIC
Bacteria, UA: NONE SEEN
Bilirubin Urine: NEGATIVE
Glucose, UA: NEGATIVE mg/dL
Hgb urine dipstick: NEGATIVE
Ketones, ur: NEGATIVE mg/dL
Leukocytes,Ua: NEGATIVE
Nitrite: NEGATIVE
Protein, ur: 100 mg/dL — AB
Specific Gravity, Urine: 1.015 (ref 1.005–1.030)
pH: 5 (ref 5.0–8.0)

## 2022-10-03 LAB — TROPONIN I (HIGH SENSITIVITY)
Troponin I (High Sensitivity): 262 ng/L (ref <18)
Troponin I (High Sensitivity): 290 ng/L (ref <18)

## 2022-10-03 LAB — LACTIC ACID, PLASMA
Lactic Acid, Venous: 1.3 mmol/L (ref 0.5–1.9)
Lactic Acid, Venous: 1.1 mmol/L (ref 0.5–1.9)

## 2022-10-03 MED ORDER — ATORVASTATIN CALCIUM 40 MG PO TABS
40.0000 mg | ORAL_TABLET | Freq: Every day | ORAL | Status: DC
  Administered 2022-10-03 – 2022-10-06 (×4): 40 mg via ORAL
  Filled 2022-10-03 (×4): qty 1

## 2022-10-03 MED ORDER — SODIUM CHLORIDE 0.9 % IV SOLN
3.0000 g | Freq: Two times a day (BID) | INTRAVENOUS | Status: DC
  Administered 2022-10-03 – 2022-10-04 (×3): 3 g via INTRAVENOUS
  Filled 2022-10-03 (×3): qty 8

## 2022-10-03 MED ORDER — LACTATED RINGERS IV SOLN: INTRAVENOUS | Status: AC

## 2022-10-03 MED ORDER — APIXABAN 2.5 MG PO TABS
2.5000 mg | ORAL_TABLET | Freq: Two times a day (BID) | ORAL | Status: DC
  Administered 2022-10-03 – 2022-10-06 (×7): 2.5 mg via ORAL
  Filled 2022-10-03 (×7): qty 1

## 2022-10-03 NOTE — Progress Notes (Signed)
Pharmacy Antibiotic Note  Patrick Giles is a 89 y.o. male admitted on 10/02/2022 with intra-abdominal infection.  Pharmacy has been consulted for Unasyn dosing.  Patient in AKI with CrCl 20-30 mL/min  Plan: Unasyn 3g Q12H F/u cultures and surgery plans  Height: 5\' 11"  (180.3 cm) Weight: 88 kg (194 lb 0.1 oz) IBW/kg (Calculated) : 75.3  Temp (24hrs), Avg:99.3 F (37.4 C), Min:97.2 F (36.2 C), Max:101.9 F (38.8 C)  Recent Labs  Lab 10/02/22 1837 10/03/22 0210 10/03/22 0645 10/03/22 0809  WBC 13.2*  --   --   --   CREATININE 2.82* 2.38*  --   --   LATICACIDVEN  --   --  1.3 1.1    Estimated Creatinine Clearance: 23.3 mL/min (A) (by C-G formula based on SCr of 2.38 mg/dL (H)).    No Known Allergies   Thank you for allowing pharmacy to be a part of this patient's care.  Merrilee Jansky, PharmD Clinical Pharmacist 10/03/2022 10:28 AM

## 2022-10-03 NOTE — ED Notes (Signed)
Ordered tray of clear liquids

## 2022-10-03 NOTE — ED Notes (Signed)
Patient had a very large liquid bowel movement , patient was cleaned and new pads and adult diaper applied. Admitting MD aware

## 2022-10-03 NOTE — ED Notes (Signed)
Pt transported to MRI

## 2022-10-03 NOTE — Progress Notes (Signed)
Subjective/Chief Complaint: He denies abdominal pain or nausea this morning No flatus Unsure of his last BM   Objective: Vital signs in last 24 hours: Temp:  [97.2 F (36.2 C)-101.9 F (38.8 C)] 100.8 F (38.2 C) (01/15 0650) Pulse Rate:  [64-138] 80 (01/15 0700) Resp:  [18-33] 18 (01/15 0700) BP: (94-156)/(49-97) 103/61 (01/15 0700) SpO2:  [90 %-97 %] 95 % (01/15 0700) Weight:  [88 kg] 88 kg (01/14 1812)    Intake/Output from previous day: 01/14 0701 - 01/15 0700 In: 1355.1 [I.V.:350; IV Piggyback:1005.1] Out: -  Intake/Output this shift: No intake/output data recorded.  Exam: Awake, following commands Looks comfortable Abdomen full but soft and non-tender with no guarding  Lab Results:  Recent Labs    10/02/22 1837  WBC 13.2*  HGB 13.5  HCT 41.6  PLT 134*   BMET Recent Labs    10/02/22 1837 10/03/22 0210  NA 137 136  K 4.7 3.9  CL 106 109  CO2 17* 19*  GLUCOSE 169* 139*  BUN 37* 36*  CREATININE 2.82* 2.38*  CALCIUM 9.2 7.5*   PT/INR No results for input(s): "LABPROT", "INR" in the last 72 hours. ABG No results for input(s): "PHART", "HCO3" in the last 72 hours.  Invalid input(s): "PCO2", "PO2"  Studies/Results: CT T-SPINE NO CHARGE  Result Date: 10/03/2022 CLINICAL DATA:  Initial evaluation for acute trauma.  Fall. EXAM: CT THORACIC SPINE WITHOUT CONTRAST TECHNIQUE: Multidetector CT images of the thoracic were obtained using the standard protocol without intravenous contrast. RADIATION DOSE REDUCTION: This exam was performed according to the departmental dose-optimization program which includes automated exposure control, adjustment of the mA and/or kV according to patient size and/or use of iterative reconstruction technique. COMPARISON:  None Available. FINDINGS: Alignment: Mild dextroscoliosis. Alignment otherwise normal with preservation of the normal thoracic kyphosis. No listhesis. Vertebrae: Vertebral body height maintained without acute  or chronic fracture. Visualized ribs intact. No discrete or worrisome osseous lesions. Paraspinal and other soft tissues: Paraspinous soft tissues demonstrate no acute finding. Aortic atherosclerosis with scattered coronary artery calcifications. Elevation of the left hemidiaphragm noted. Associated left basilar opacity likely reflects compressive atelectasis. Disc levels: Mild for age degenerative spondylosis within the mid and lower thoracic spine. No significant spinal stenosis by CT. IMPRESSION: 1. No acute osseous injury within the thoracic spine. 2. Elevation of the left hemidiaphragm with associated left basilar opacity, likely atelectasis. Aortic Atherosclerosis (ICD10-I70.0). Electronically Signed   By: Jeannine Boga M.D.   On: 10/03/2022 00:17   CT L-SPINE NO CHARGE  Result Date: 10/03/2022 CLINICAL DATA:  Initial evaluation for acute trauma, unwitnessed fall. EXAM: CT LUMBAR SPINE WITHOUT CONTRAST TECHNIQUE: Multidetector CT imaging of the lumbar spine was performed without intravenous contrast administration. Multiplanar CT image reconstructions were also generated. RADIATION DOSE REDUCTION: This exam was performed according to the departmental dose-optimization program which includes automated exposure control, adjustment of the mA and/or kV according to patient size and/or use of iterative reconstruction technique. COMPARISON:  Comparison made with concomitant CT of the abdomen and pelvis. FINDINGS: Segmentation: Standard. Lowest well-formed disc space labeled the L5-S1 level. Alignment: Moderate levoscoliosis, apex at L2-3. Trace degenerative retrolisthesis of L2 on L3 L4, and L4 on L5., L3 on Vertebrae: Vertebral body height maintained without acute or chronic fracture. Visualized sacrum and pelvis intact. No worrisome osseous lesions. Paraspinal and other soft tissues: Paraspinous soft tissues demonstrate no acute finding. Moderate aortic atherosclerosis. Multiple scattered simple cysts  noted about the partially visualized kidneys, benign in appearance, no  follow-up imaging recommended. Prior cholecystectomy. Disc levels: Underlying moderate multilevel degenerative disc disease, most pronounced at L2-3 through L3-4. Lower lumbar facet hypertrophy. Resultant moderate to severe spinal stenosis at L4-5. IMPRESSION: 1. No acute osseous injury within the lumbar spine. 2. Levoscoliosis with moderate multilevel degenerative disc disease and facet hypertrophy. Resultant moderate to severe spinal stenosis at L4-5. Aortic Atherosclerosis (ICD10-I70.0). Electronically Signed   By: Rise Mu M.D.   On: 10/03/2022 00:14   CT Head Wo Contrast  Result Date: 10/02/2022 CLINICAL DATA:  The patient found down after an unwitnessed fall, reportedly was on the floor for 10 hours before discovery. Suspected blunt polytrauma but denies hitting head. EXAM: CT HEAD WITHOUT CONTRAST CT CERVICAL SPINE WITHOUT CONTRAST CT CHEST, ABDOMEN AND PELVIS WITHOUT CONTRAST TECHNIQUE: Contiguous axial images were obtained from the base of the skull through the vertex without intravenous contrast. Multidetector CT imaging of the cervical spine was performed without intravenous contrast. Multiplanar CT image reconstructions were also generated. Multidetector CT imaging of the chest, abdomen and pelvis was performed following the standard protocol without IV contrast. RADIATION DOSE REDUCTION: This exam was performed according to the departmental dose-optimization program which includes automated exposure control, adjustment of the mA and/or kV according to patient size and/or use of iterative reconstruction technique. COMPARISON:  Last brain study was MRI brain 06/18/2018, last neck study with CT angiography neck 06/18/2018, with comparison made to chest CT without contrast 10/06/2021 and an abdomen and pelvis CT with contrast of 05/22/2014. FINDINGS: CT HEAD FINDINGS Brain: There is mild global atrophy for age, mild  small-vessel disease of the cerebral white matter, and chronic lacunar infarcts in both gangliocapsular areas left frontoparietal white matter near the vertex. No acute infarct, hemorrhage or mass is seen. Ventricles are normal in size and position. There are benign dural calcifications scattered along side of the falx. Vascular: There are patchy calcifications both carotid siphons, left vertebral artery and basilar artery. No hyperdense central vessel. Skull: No scalp hematoma is seen. The calvarium, skull base and orbits are intact. There is a chronic fracture deformity of the nasal bone. Sinuses/Orbits: Unremarkable orbits and orbital contents. There is diffuse fluid opacification of the right mastoid air cells and middle ear cavity and a small attenuated right ossicular chain. This appears to have been present previously. Moderate patchy fluid in the lower left mastoid air cells is also similar. There is worsening multifocal opacification of the ethmoid air cells. There is increased membrane thickening and bilateral small low-density fluid levels in the maxillary sinuses. The frontal and sphenoid sinus are clear.  Nasal septum is S shaped. Other: None. CT CERVICAL FINDINGS Alignment: Chronic trace degenerative anterolisthesis C3-4, unchanged, and also again noted mild reversed cervical lordosis. There is bone-on-bone anterior atlantodental joint space loss, with small osteophytes, as before. No new or acute alignment abnormality. Skull base and vertebrae: Patient motion limits the sensitivity of the study. No spinal compression fracture or obvious displaced fracture seen. Osteopenia is noted but no other primary pathologic process of bone is seen. Soft tissues and spinal canal: Limited due to motion. No prevertebral fluid or soft tissue swelling. No obvious canal hematoma. The proximal cervical ICAs are heavily calcified. No thyroid or laryngeal masses seen. Disc levels: Chronic disc collapse C4-5 through C6-7.  There are small bidirectional osteophytes but no significant soft tissue or bony encroachment on the thecal sac. Other cervical discs are normal in height. There are facet joint and uncinate osteophytes at all levels. There is moderate to  severe degenerative foraminal stenosis at C3-4, but elsewhere no significant foraminal encroachment. Other: None. CT CHEST FINDINGS Cardiovascular: The cardiac size is normal. 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 Stable dilatation of the pulmonary trunk, 3.7 cm indicating chronic arterial hypertension. Aortic atherosclerosis and scattered plaque in the great vessels is also again noted as well as stable ascending aortic dilatation to 4.3 cm. Pulmonary veins are decompressed. Mediastinum/Nodes: No intrathoracic, supraclavicular or axillary adenopathy. Small shotty subcarinal lymph nodes are unchanged. There are calcified right paratracheal nodes chronically. There is no thyroid mass, tracheal or main bronchi filling defect, or esophageal thickening and no hiatal hernia. Lungs/Pleura: Chronic severe elevation left hemidiaphragm consistent with hernia, paresis or large eventration. The dome reaching as high as the aortic arch and there is chronic overlying atelectasis in the left upper lobe. In the left lower lobe there are chronic compressive changes due to elevated hemidiaphragm or herniation, particularly through the basal segments, but today there is increased opacity which could indicate a superimposed pneumonic infiltrate as compared to the prior studies. There are scattered air bronchograms in the area. The left lower lobe superior segment remains clear. There are few linear scar-like opacities in the left upper lobe but no infiltrate. There is posterior atelectasis in the right lower lobe but no infiltrate. No nodule is seen. There is no pleural effusion, thickening or pneumothorax. There are small calcified pleural plaques in the posterior  basal left chest, calcified right upper lobe granuloma posteriorly on 5:43. Musculoskeletal: There is mild chronic wedging of the T12 vertebral body, osteopenia and degenerative change in the lower thoracic spine. There are healed left rib cage fractures but no acute rib fracture is evident. The sternum is intact. No abnormal focal osseous lesions. CT ABDOMEN AND PELVIS FINDINGS Hepatobiliary: There are scattered calcified hepatic granulomas. Without contrast no hepatic injury or perihepatic hematoma is seen. There is old cholecystectomy without biliary dilatation. Pancreas: No focal abnormality is seen without contrast. Respiratory motion limits fine detail. Spleen: Intrathoracic. No focal abnormality is seen without contrast apart from scattered calcified granulomas. No splenomegaly. Limited fine detail due to breathing motion and streak artifact from the patient's arms in the field. Adrenals/Urinary Tract: There is no adrenal mass. There are numerous bilateral thin walled homogeneous renal cysts including several large cysts, largest on the left is 8.1 cm, largest on the right is in the upper pole approaching 9 cm. The larger of the cysts are slightly larger than in 2015 but no suspicious abnormality is seen in the unenhanced kidneys. There is no urinary stone or obstruction. There is no bladder thickening. Stomach/Bowel: The stomach is intrathoracic as are the distal transverse and proximal descending colon. The gastric thickness is normal. Small bowel is diffusely dilated up to 3.7 cm except in the right mid to lower abdomen where the distal ileal segments and terminal ileum are decompressed. The focal transition from dilated to decompressed caliber is not identified. The appendix is normal. The large bowel wall is unremarkable proximally. The descending and sigmoid colon show diverticula with wall thickening and stranding alongside the distal descending colon series 3 axial images 78-101, findings consistent  with a mild acute distal descending diverticulitis. The sigmoid diverticulosis is uncomplicated. There is moderate retained stool in the distal sigmoid and rectum. No wall thickening in the rectum. Vascular/Lymphatic: Moderate aortoiliac calcific plaque. Interval slight dilatation in the distal infrarenal aorta measuring 2.7 cm. Follow-up imaging at 5 year intervals is recommended. No adenopathy is seen. Reproductive:  No prostatomegaly. Central prostatic calcifications are present and there appears to be a TURP defect. Other: There has no free air, free hemorrhage, free fluid or incarcerated hernia. Musculoskeletal: There is levoscoliosis and degenerative changes of the lumbar spine. No spinal compression fracture or other acute regional osseous findings. Bridging osteophytes anterior right SI joint are noted. Mild-to-moderate left and mild right hip DJD. No surface hematoma is seen. IMPRESSION: 1. No acute intracranial CT findings or depressed skull fractures. Chronic change. 2. Left mastoid effusion, with diffuse right mastoid or middle ear opacification and chronic attenuated and small right ossicular chain. This appears to have been present in 2019 also. 3. Osteopenia and degenerative change without evidence of cervical fractures or traumatic listhesis. 4. 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 5. Increasing opacity in the basal segments of the left lower lobe which could be due to a superimposed pneumonic infiltrate versus increased compressive changes from the elevated hemidiaphragm. 6. Increasing ethmoid and maxillary sinus disease with small fluid levels in the maxillary sinuses. Correlate clinically for acute sinusitis. 7. No acute trauma related findings in the chest, abdomen or pelvis. 8. 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 9. Stable dilatation of the pulmonary trunk indicating chronic arterial hypertension. 10. Stable 4.3  cm ascending aortic dilatation. Annual CTA or MRA follow-up recommended. 11. Mild acute distal descending diverticulitis. No free air, free fluid or abscess. 12. Small-bowel obstruction with dilatation to 3.7 cm. The distal ileal segments are decompressed. Etiology could be occult adhesions, occult internal hernia, enteritis or occult mass. A focal transition could not be found. 13. Numerous bilateral renal cysts. 14. Interval slight dilatation in the distal infrarenal aorta measuring 2.7 cm. Follow-up imaging at 5 year intervals is recommended. Aortic Atherosclerosis (ICD10-I70.0). Electronically Signed   By: Almira BarKeith  Chesser M.D.   On: 10/02/2022 22:53   CT Cervical Spine Wo Contrast  Result Date: 10/02/2022 CLINICAL DATA:  The patient found down after an unwitnessed fall, reportedly was on the floor for 10 hours before discovery. Suspected blunt polytrauma but denies hitting head. EXAM: CT HEAD WITHOUT CONTRAST CT CERVICAL SPINE WITHOUT CONTRAST CT CHEST, ABDOMEN AND PELVIS WITHOUT CONTRAST TECHNIQUE: Contiguous axial images were obtained from the base of the skull through the vertex without intravenous contrast. Multidetector CT imaging of the cervical spine was performed without intravenous contrast. Multiplanar CT image reconstructions were also generated. Multidetector CT imaging of the chest, abdomen and pelvis was performed following the standard protocol without IV contrast. RADIATION DOSE REDUCTION: This exam was performed according to the departmental dose-optimization program which includes automated exposure control, adjustment of the mA and/or kV according to patient size and/or use of iterative reconstruction technique. COMPARISON:  Last brain study was MRI brain 06/18/2018, last neck study with CT angiography neck 06/18/2018, with comparison made to chest CT without contrast 10/06/2021 and an abdomen and pelvis CT with contrast of 05/22/2014. FINDINGS: CT HEAD FINDINGS Brain: There is mild global  atrophy for age, mild small-vessel disease of the cerebral white matter, and chronic lacunar infarcts in both gangliocapsular areas left frontoparietal white matter near the vertex. No acute infarct, hemorrhage or mass is seen. Ventricles are normal in size and position. There are benign dural calcifications scattered along side of the falx. Vascular: There are patchy calcifications both carotid siphons, left vertebral artery and basilar artery. No hyperdense central vessel. Skull: No scalp hematoma is seen. The calvarium, skull base and orbits are intact. There is a chronic  fracture deformity of the nasal bone. Sinuses/Orbits: Unremarkable orbits and orbital contents. There is diffuse fluid opacification of the right mastoid air cells and middle ear cavity and a small attenuated right ossicular chain. This appears to have been present previously. Moderate patchy fluid in the lower left mastoid air cells is also similar. There is worsening multifocal opacification of the ethmoid air cells. There is increased membrane thickening and bilateral small low-density fluid levels in the maxillary sinuses. The frontal and sphenoid sinus are clear.  Nasal septum is S shaped. Other: None. CT CERVICAL FINDINGS Alignment: Chronic trace degenerative anterolisthesis C3-4, unchanged, and also again noted mild reversed cervical lordosis. There is bone-on-bone anterior atlantodental joint space loss, with small osteophytes, as before. No new or acute alignment abnormality. Skull base and vertebrae: Patient motion limits the sensitivity of the study. No spinal compression fracture or obvious displaced fracture seen. Osteopenia is noted but no other primary pathologic process of bone is seen. Soft tissues and spinal canal: Limited due to motion. No prevertebral fluid or soft tissue swelling. No obvious canal hematoma. The proximal cervical ICAs are heavily calcified. No thyroid or laryngeal masses seen. Disc levels: Chronic disc  collapse C4-5 through C6-7. There are small bidirectional osteophytes but no significant soft tissue or bony encroachment on the thecal sac. Other cervical discs are normal in height. There are facet joint and uncinate osteophytes at all levels. There is moderate to severe degenerative foraminal stenosis at C3-4, but elsewhere no significant foraminal encroachment. Other: None. CT CHEST FINDINGS Cardiovascular: The cardiac size is normal. 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 Stable dilatation of the pulmonary trunk, 3.7 cm indicating chronic arterial hypertension. Aortic atherosclerosis and scattered plaque in the great vessels is also again noted as well as stable ascending aortic dilatation to 4.3 cm. Pulmonary veins are decompressed. Mediastinum/Nodes: No intrathoracic, supraclavicular or axillary adenopathy. Small shotty subcarinal lymph nodes are unchanged. There are calcified right paratracheal nodes chronically. There is no thyroid mass, tracheal or main bronchi filling defect, or esophageal thickening and no hiatal hernia. Lungs/Pleura: Chronic severe elevation left hemidiaphragm consistent with hernia, paresis or large eventration. The dome reaching as high as the aortic arch and there is chronic overlying atelectasis in the left upper lobe. In the left lower lobe there are chronic compressive changes due to elevated hemidiaphragm or herniation, particularly through the basal segments, but today there is increased opacity which could indicate a superimposed pneumonic infiltrate as compared to the prior studies. There are scattered air bronchograms in the area. The left lower lobe superior segment remains clear. There are few linear scar-like opacities in the left upper lobe but no infiltrate. There is posterior atelectasis in the right lower lobe but no infiltrate. No nodule is seen. There is no pleural effusion, thickening or pneumothorax. There are small calcified pleural  plaques in the posterior basal left chest, calcified right upper lobe granuloma posteriorly on 5:43. Musculoskeletal: There is mild chronic wedging of the T12 vertebral body, osteopenia and degenerative change in the lower thoracic spine. There are healed left rib cage fractures but no acute rib fracture is evident. The sternum is intact. No abnormal focal osseous lesions. CT ABDOMEN AND PELVIS FINDINGS Hepatobiliary: There are scattered calcified hepatic granulomas. Without contrast no hepatic injury or perihepatic hematoma is seen. There is old cholecystectomy without biliary dilatation. Pancreas: No focal abnormality is seen without contrast. Respiratory motion limits fine detail. Spleen: Intrathoracic. No focal abnormality is seen without contrast apart from  scattered calcified granulomas. No splenomegaly. Limited fine detail due to breathing motion and streak artifact from the patient's arms in the field. Adrenals/Urinary Tract: There is no adrenal mass. There are numerous bilateral thin walled homogeneous renal cysts including several large cysts, largest on the left is 8.1 cm, largest on the right is in the upper pole approaching 9 cm. The larger of the cysts are slightly larger than in 2015 but no suspicious abnormality is seen in the unenhanced kidneys. There is no urinary stone or obstruction. There is no bladder thickening. Stomach/Bowel: The stomach is intrathoracic as are the distal transverse and proximal descending colon. The gastric thickness is normal. Small bowel is diffusely dilated up to 3.7 cm except in the right mid to lower abdomen where the distal ileal segments and terminal ileum are decompressed. The focal transition from dilated to decompressed caliber is not identified. The appendix is normal. The large bowel wall is unremarkable proximally. The descending and sigmoid colon show diverticula with wall thickening and stranding alongside the distal descending colon series 3 axial images  78-101, findings consistent with a mild acute distal descending diverticulitis. The sigmoid diverticulosis is uncomplicated. There is moderate retained stool in the distal sigmoid and rectum. No wall thickening in the rectum. Vascular/Lymphatic: Moderate aortoiliac calcific plaque. Interval slight dilatation in the distal infrarenal aorta measuring 2.7 cm. Follow-up imaging at 5 year intervals is recommended. No adenopathy is seen. Reproductive: No prostatomegaly. Central prostatic calcifications are present and there appears to be a TURP defect. Other: There has no free air, free hemorrhage, free fluid or incarcerated hernia. Musculoskeletal: There is levoscoliosis and degenerative changes of the lumbar spine. No spinal compression fracture or other acute regional osseous findings. Bridging osteophytes anterior right SI joint are noted. Mild-to-moderate left and mild right hip DJD. No surface hematoma is seen. IMPRESSION: 1. No acute intracranial CT findings or depressed skull fractures. Chronic change. 2. Left mastoid effusion, with diffuse right mastoid or middle ear opacification and chronic attenuated and small right ossicular chain. This appears to have been present in 2019 also. 3. Osteopenia and degenerative change without evidence of cervical fractures or traumatic listhesis. 4. 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 5. Increasing opacity in the basal segments of the left lower lobe which could be due to a superimposed pneumonic infiltrate versus increased compressive changes from the elevated hemidiaphragm. 6. Increasing ethmoid and maxillary sinus disease with small fluid levels in the maxillary sinuses. Correlate clinically for acute sinusitis. 7. No acute trauma related findings in the chest, abdomen or pelvis. 8. 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 9. Stable dilatation of the pulmonary trunk indicating chronic arterial  hypertension. 10. Stable 4.3 cm ascending aortic dilatation. Annual CTA or MRA follow-up recommended. 11. Mild acute distal descending diverticulitis. No free air, free fluid or abscess. 12. Small-bowel obstruction with dilatation to 3.7 cm. The distal ileal segments are decompressed. Etiology could be occult adhesions, occult internal hernia, enteritis or occult mass. A focal transition could not be found. 13. Numerous bilateral renal cysts. 14. Interval slight dilatation in the distal infrarenal aorta measuring 2.7 cm. Follow-up imaging at 5 year intervals is recommended. Aortic Atherosclerosis (ICD10-I70.0). Electronically Signed   By: Almira Bar M.D.   On: 10/02/2022 22:53   CT CHEST ABDOMEN PELVIS WO CONTRAST  Result Date: 10/02/2022 CLINICAL DATA:  The patient found down after an unwitnessed fall, reportedly was on the floor for 10 hours before discovery. Suspected  blunt polytrauma but denies hitting head. EXAM: CT HEAD WITHOUT CONTRAST CT CERVICAL SPINE WITHOUT CONTRAST CT CHEST, ABDOMEN AND PELVIS WITHOUT CONTRAST TECHNIQUE: Contiguous axial images were obtained from the base of the skull through the vertex without intravenous contrast. Multidetector CT imaging of the cervical spine was performed without intravenous contrast. Multiplanar CT image reconstructions were also generated. Multidetector CT imaging of the chest, abdomen and pelvis was performed following the standard protocol without IV contrast. RADIATION DOSE REDUCTION: This exam was performed according to the departmental dose-optimization program which includes automated exposure control, adjustment of the mA and/or kV according to patient size and/or use of iterative reconstruction technique. COMPARISON:  Last brain study was MRI brain 06/18/2018, last neck study with CT angiography neck 06/18/2018, with comparison made to chest CT without contrast 10/06/2021 and an abdomen and pelvis CT with contrast of 05/22/2014. FINDINGS: CT HEAD  FINDINGS Brain: There is mild global atrophy for age, mild small-vessel disease of the cerebral white matter, and chronic lacunar infarcts in both gangliocapsular areas left frontoparietal white matter near the vertex. No acute infarct, hemorrhage or mass is seen. Ventricles are normal in size and position. There are benign dural calcifications scattered along side of the falx. Vascular: There are patchy calcifications both carotid siphons, left vertebral artery and basilar artery. No hyperdense central vessel. Skull: No scalp hematoma is seen. The calvarium, skull base and orbits are intact. There is a chronic fracture deformity of the nasal bone. Sinuses/Orbits: Unremarkable orbits and orbital contents. There is diffuse fluid opacification of the right mastoid air cells and middle ear cavity and a small attenuated right ossicular chain. This appears to have been present previously. Moderate patchy fluid in the lower left mastoid air cells is also similar. There is worsening multifocal opacification of the ethmoid air cells. There is increased membrane thickening and bilateral small low-density fluid levels in the maxillary sinuses. The frontal and sphenoid sinus are clear.  Nasal septum is S shaped. Other: None. CT CERVICAL FINDINGS Alignment: Chronic trace degenerative anterolisthesis C3-4, unchanged, and also again noted mild reversed cervical lordosis. There is bone-on-bone anterior atlantodental joint space loss, with small osteophytes, as before. No new or acute alignment abnormality. Skull base and vertebrae: Patient motion limits the sensitivity of the study. No spinal compression fracture or obvious displaced fracture seen. Osteopenia is noted but no other primary pathologic process of bone is seen. Soft tissues and spinal canal: Limited due to motion. No prevertebral fluid or soft tissue swelling. No obvious canal hematoma. The proximal cervical ICAs are heavily calcified. No thyroid or laryngeal masses  seen. Disc levels: Chronic disc collapse C4-5 through C6-7. There are small bidirectional osteophytes but no significant soft tissue or bony encroachment on the thecal sac. Other cervical discs are normal in height. There are facet joint and uncinate osteophytes at all levels. There is moderate to severe degenerative foraminal stenosis at C3-4, but elsewhere no significant foraminal encroachment. Other: None. CT CHEST FINDINGS Cardiovascular: The cardiac size is normal. 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 Stable dilatation of the pulmonary trunk, 3.7 cm indicating chronic arterial hypertension. Aortic atherosclerosis and scattered plaque in the great vessels is also again noted as well as stable ascending aortic dilatation to 4.3 cm. Pulmonary veins are decompressed. Mediastinum/Nodes: No intrathoracic, supraclavicular or axillary adenopathy. Small shotty subcarinal lymph nodes are unchanged. There are calcified right paratracheal nodes chronically. There is no thyroid mass, tracheal or main bronchi filling defect, or esophageal thickening and no  hiatal hernia. Lungs/Pleura: Chronic severe elevation left hemidiaphragm consistent with hernia, paresis or large eventration. The dome reaching as high as the aortic arch and there is chronic overlying atelectasis in the left upper lobe. In the left lower lobe there are chronic compressive changes due to elevated hemidiaphragm or herniation, particularly through the basal segments, but today there is increased opacity which could indicate a superimposed pneumonic infiltrate as compared to the prior studies. There are scattered air bronchograms in the area. The left lower lobe superior segment remains clear. There are few linear scar-like opacities in the left upper lobe but no infiltrate. There is posterior atelectasis in the right lower lobe but no infiltrate. No nodule is seen. There is no pleural effusion, thickening or pneumothorax.  There are small calcified pleural plaques in the posterior basal left chest, calcified right upper lobe granuloma posteriorly on 5:43. Musculoskeletal: There is mild chronic wedging of the T12 vertebral body, osteopenia and degenerative change in the lower thoracic spine. There are healed left rib cage fractures but no acute rib fracture is evident. The sternum is intact. No abnormal focal osseous lesions. CT ABDOMEN AND PELVIS FINDINGS Hepatobiliary: There are scattered calcified hepatic granulomas. Without contrast no hepatic injury or perihepatic hematoma is seen. There is old cholecystectomy without biliary dilatation. Pancreas: No focal abnormality is seen without contrast. Respiratory motion limits fine detail. Spleen: Intrathoracic. No focal abnormality is seen without contrast apart from scattered calcified granulomas. No splenomegaly. Limited fine detail due to breathing motion and streak artifact from the patient's arms in the field. Adrenals/Urinary Tract: There is no adrenal mass. There are numerous bilateral thin walled homogeneous renal cysts including several large cysts, largest on the left is 8.1 cm, largest on the right is in the upper pole approaching 9 cm. The larger of the cysts are slightly larger than in 2015 but no suspicious abnormality is seen in the unenhanced kidneys. There is no urinary stone or obstruction. There is no bladder thickening. Stomach/Bowel: The stomach is intrathoracic as are the distal transverse and proximal descending colon. The gastric thickness is normal. Small bowel is diffusely dilated up to 3.7 cm except in the right mid to lower abdomen where the distal ileal segments and terminal ileum are decompressed. The focal transition from dilated to decompressed caliber is not identified. The appendix is normal. The large bowel wall is unremarkable proximally. The descending and sigmoid colon show diverticula with wall thickening and stranding alongside the distal  descending colon series 3 axial images 78-101, findings consistent with a mild acute distal descending diverticulitis. The sigmoid diverticulosis is uncomplicated. There is moderate retained stool in the distal sigmoid and rectum. No wall thickening in the rectum. Vascular/Lymphatic: Moderate aortoiliac calcific plaque. Interval slight dilatation in the distal infrarenal aorta measuring 2.7 cm. Follow-up imaging at 5 year intervals is recommended. No adenopathy is seen. Reproductive: No prostatomegaly. Central prostatic calcifications are present and there appears to be a TURP defect. Other: There has no free air, free hemorrhage, free fluid or incarcerated hernia. Musculoskeletal: There is levoscoliosis and degenerative changes of the lumbar spine. No spinal compression fracture or other acute regional osseous findings. Bridging osteophytes anterior right SI joint are noted. Mild-to-moderate left and mild right hip DJD. No surface hematoma is seen. IMPRESSION: 1. No acute intracranial CT findings or depressed skull fractures. Chronic change. 2. Left mastoid effusion, with diffuse right mastoid or middle ear opacification and chronic attenuated and small right ossicular chain. This appears to have been present in  2019 also. 3. Osteopenia and degenerative change without evidence of cervical fractures or traumatic listhesis. 4. 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 5. Increasing opacity in the basal segments of the left lower lobe which could be due to a superimposed pneumonic infiltrate versus increased compressive changes from the elevated hemidiaphragm. 6. Increasing ethmoid and maxillary sinus disease with small fluid levels in the maxillary sinuses. Correlate clinically for acute sinusitis. 7. No acute trauma related findings in the chest, abdomen or pelvis. 8. 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 9. Stable dilatation of the  pulmonary trunk indicating chronic arterial hypertension. 10. Stable 4.3 cm ascending aortic dilatation. Annual CTA or MRA follow-up recommended. 11. Mild acute distal descending diverticulitis. No free air, free fluid or abscess. 12. Small-bowel obstruction with dilatation to 3.7 cm. The distal ileal segments are decompressed. Etiology could be occult adhesions, occult internal hernia, enteritis or occult mass. A focal transition could not be found. 13. Numerous bilateral renal cysts. 14. Interval slight dilatation in the distal infrarenal aorta measuring 2.7 cm. Follow-up imaging at 5 year intervals is recommended. Aortic Atherosclerosis (ICD10-I70.0). Electronically Signed   By: Almira Bar M.D.   On: 10/02/2022 22:53   DG Shoulder Right  Result Date: 10/02/2022 CLINICAL DATA:  Larey Seat, found down EXAM: RIGHT SHOULDER - 2+ VIEW COMPARISON:  None Available. FINDINGS: Internal rotation, external rotation, and transscapular views of the right shoulder are obtained. No fracture, subluxation, or dislocation. Moderate hypertrophic changes of the acromioclavicular joint. Soft tissues are unremarkable. Right chest is clear. IMPRESSION: 1. No acute displaced fracture. 2. Moderate acromioclavicular joint osteoarthropathy. Electronically Signed   By: Sharlet Salina M.D.   On: 10/02/2022 19:19   DG Pelvis Portable  Result Date: 10/02/2022 CLINICAL DATA:  Larey Seat, found down EXAM: PORTABLE PELVIS 1-2 VIEWS COMPARISON:  None Available. FINDINGS: 2 supine frontal views of the pelvis are obtained, including both hips. No acute displaced fracture. Alignment is anatomic. Mild symmetrical bilateral hip osteoarthritis. Soft tissues are unremarkable. IMPRESSION: 1. No acute displaced fracture. Electronically Signed   By: Sharlet Salina M.D.   On: 10/02/2022 19:18   DG Chest Port 1 View  Result Date: 10/02/2022 CLINICAL DATA:  Larey Seat, found down EXAM: PORTABLE CHEST 1 VIEW COMPARISON:  11/22/2015 FINDINGS: Single frontal view  of the chest demonstrates chronic elevation of the left hemidiaphragm. Cardiac silhouette is stable, with postsurgical changes from prior CABG. Chronic atelectasis at the left lung base. No airspace disease, effusion, or pneumothorax. Left posterolateral sixth and seventh rib fractures are identified, age indeterminate. No other bony abnormalities. IMPRESSION: 1. Age indeterminate left posterolateral sixth and seventh rib fractures. Please correlate with site of patient's pain. 2. Chronic elevation left hemidiaphragm.  No acute airspace disease. Electronically Signed   By: Sharlet Salina M.D.   On: 10/02/2022 19:17    Anti-infectives: Anti-infectives (From admission, onward)    None       Assessment/Plan: S/p Fall, found down after 10 hours, dilated small bowel and possible mild diverticulitis on CT  Given his abdominal exam, I suspect this is an ileus.  No plans for surgery at this point.  Would still limit po tp sips and ice chips for now. If emesis develops, will need and NG We will follow   LOS: 0 days    Abigail Miyamoto 10/03/2022

## 2022-10-03 NOTE — Progress Notes (Signed)
Interval history Febrile overnight.  Large-volume watery bowel movement.  Denies any abdominal pain right now, nausea, or vomiting. Does note he has had diarrhea for a couple of days - denies any sick contacts or any bad/spoiled foods. No fevers, chills at home. Denies hematochezia. No cough, chest pain, or dyspnea. Feels thirsty.   Played hockey 9 years  Physical exam Blood pressure 102/66, pulse 73, temperature (!) 100.8 F (38.2 C), temperature source Rectal, resp. rate 16, height 5\' 11"  (1.803 m), weight 88 kg, SpO2 95 %.  Patient supine in bed, no apparent distress Dry oral mucous membranes Heart rate and rhythm are regular Tachypnea, otherwise unlabored breathing and bilateral lung sounds clear Abdomen is soft, nontender, nondistended, tympanic to percussion Skin is warm and dry with decreased turgor Alert and oriented x 4 without gross focal neurologic deficits Does not, mood and affect are concordant  Labs Reviewed, notable for Lactate within normal limits Troponin peaked 290 BUN/creatinine 36/2.38 Albumin 2.3 Total protein 4.6  Images and other studies Reviewed, notable for Age-related cranial and spinal degenerative changes Left hemidiaphragmatic elevation Distended small bowel and descending and sigmoid colonic diverticula with some bowel wall thickening  Assessment and plan Hospital day 0  Patrick Giles is a 89 y.o. community dwelling person with cardiovascular disease and recent history notable for upper respiratory infection treated with doxycycline, who presents after a few days of weakness, poor p.o. intake, and diarrhea, culminating in a fall off of the commode and prolonged downtime, hospital course notable for fever and findings on imaging suggestive of ileus versus diverticulitis/colitis.  Principal Problem:   Diarrhea Active Problems:   Dyslipidemia   CKD (chronic kidney disease), stage III (HCC)   Diverticulosis of colon   Weakness    AKI (acute kidney injury) ([place])   Fever   Atrial fibrillation (HCC)  Sepsis Non-bloody diarrhea Weakness Fever Febrile and tachypneic with leukocytosis and AKI.  Differential diagnosis includes infectious diarrhea, C. difficile, diverticulitis, ileus.  Exam is reassuring without abdominal pain or peritoneal signs.  The patient is not obstructed.  Lack of abdominal pain and significantly elevated white count is not consistent with C. difficile.  My impression is for community-acquired enterocolitis that caused diarrhea and hypovolemia in this elderly patient, but C. difficile or intra-abdominal infection like diverticulitis is not ruled out.  GI pathogen panel and C. difficile screening is pending.  Will start antibiotics.  Continue supportive therapy.  Appreciate surgery and pharmacy input. - Start ampicillin sulbactam IV per pharmacy - N.p.o. and continue IV fluids - GI pathogen panel - C. difficile screening - Blood cultures pending  AKI on CKD 3 Due to hypovolemia in setting of diarrhea and poor intake over the last few days.  Continue IV fluids and trend creatinine. - LR IV 100 mL/h x 10 h - A.m. BMP  Atrial fibrillation Currently in sinus rhythm with several PACs noted.  On metoprolol and Eliquis at home. - A.m. magnesium level - Continue Eliquis - Hold metoprolol - Telemetry  Dyslipidemia - Continue home atorvastatin  Diet: N.p.o. IVF: LR 100 mL/h VTE: Therapeutic Eliquis for A-fib Code: DNR Family Update: Will update by telephone  Discharge plan: Pending treatment of sepsis and diarrheal illness.  Nani Gasser MD 10/03/2022, 10:33 AM  Pager: [phone] After 5pm on weekdays and 1pm on weekends: [phone]

## 2022-10-03 NOTE — ED Notes (Signed)
Pt used urinal to void. No assistance required

## 2022-10-03 NOTE — Evaluation (Signed)
Physical Therapy Evaluation Patient Details Name: Patrick Giles MRN: [id number] DOB: [birth year] Today's Date: 10/03/2022  History of Present Illness  89 y.o. male presents to [facility] hospital on 10/02/2022 with weakness, diarrhea, and a fall off the commode. Imaging notable for ileus vs diverticulitis. PMH includes CAD, HTN, HLD, CVA, afib.  Clinical Impression  Pt presents to PT with deficits in functional mobility, gait, balance, strength, power, endurance. Pt demonstrates generalized weakness, performing bed mobility with great effort and some assistance of PT. Pt is able to ambulate for short household distances, however he does appear to fatigue at the end of ambulation bout. Pt will benefit from aggressive mobilization in an effort to improve activity tolerance and reduce falls risk. Pt reports he feels his daughters will be able to assist him frequently at the time of discharge. If this level of assistance is available then PT recommends HHPT. If assistance of family is very limited then SNF placement may need to be considered.       Recommendations for follow up therapy are one component of a multi-disciplinary discharge planning process, led by the attending physician.  Recommendations may be updated based on patient status, additional functional criteria and insurance authorization.  Follow Up Recommendations Home health PT      Assistance Recommended at Discharge Intermittent Supervision/Assistance  Patient can return home with the following  A little help with walking and/or transfers;A little help with bathing/dressing/bathroom;Assistance with cooking/housework;Assist for transportation;Help with stairs or ramp for entrance    Equipment Recommendations None recommended by PT  Recommendations for Other Services       Functional Status Assessment Patient has had a recent decline in their functional status and demonstrates the ability to make significant improvements in function in a  reasonable and predictable amount of time.     Precautions / Restrictions Precautions Precautions: Fall Precaution Comments: enteric precautions, tachycardic/afib Restrictions Weight Bearing Restrictions: No      Mobility  Bed Mobility Overal bed mobility: Needs Assistance Bed Mobility: Supine to Sit, Sit to Supine     Supine to sit: Min assist, HOB elevated Sit to supine: Min assist        Transfers Overall transfer level: Needs assistance Equipment used: None Transfers: Sit to/from Stand Sit to Stand: Min guard                Ambulation/Gait Ambulation/Gait assistance: Min guard Gait Distance (Feet): 15 Feet Assistive device: Rolling walker (2 wheels) Gait Pattern/deviations: Step-to pattern Gait velocity: reduced Gait velocity interpretation: <1.31 ft/sec, indicative of household ambulator   General Gait Details: slowed step-to gait, reduced step length and foot clearance bilaterally  Stairs            Wheelchair Mobility    Modified Rankin (Stroke Patients Only)       Balance Overall balance assessment: Needs assistance Sitting-balance support: No upper extremity supported, Feet supported Sitting balance-Leahy Scale: Fair     Standing balance support: Bilateral upper extremity supported, Reliant on assistive device for balance Standing balance-Leahy Scale: Poor                               Pertinent Vitals/Pain Pain Assessment Pain Assessment: No/denies pain    Home Living Family/patient expects to be discharged to:: Private residence Living Arrangements: Alone Available Help at Discharge: Family;Available PRN/intermittently (one daughter in [place], pt has 3 other daughters 2+ hours away, he is hopeful they will come to  assist him at discharge) Type of Home: House Home Access: Level entry     Alternate Level Stairs-Number of Steps: 2 Home Layout: Multi-level (2 steps into bedroom) Home Equipment: Rolling Walker  (2 wheels);Rollator (4 wheels);Shower seat      Prior Function Prior Level of Function : Independent/Modified Independent             Mobility Comments: ambulates with support of RW       Hand Dominance   Dominant Hand: Right    Extremity/Trunk Assessment   Upper Extremity Assessment Upper Extremity Assessment: Generalized weakness    Lower Extremity Assessment Lower Extremity Assessment: Generalized weakness    Cervical / Trunk Assessment Cervical / Trunk Assessment: Kyphotic  Communication   Communication: HOH  Cognition Arousal/Alertness: Awake/alert Behavior During Therapy: WFL for tasks assessed/performed Overall Cognitive Status: Within Functional Limits for tasks assessed                                          General Comments General comments (skin integrity, edema, etc.): tachy up to 140 observed by PT, monitor reading afib intermittently    Exercises     Assessment/Plan    PT Assessment Patient needs continued PT services  PT Problem List Decreased strength;Decreased activity tolerance;Decreased balance;Decreased mobility;Decreased knowledge of use of DME       PT Treatment Interventions DME instruction;Gait training;Stair training;Functional mobility training;Therapeutic activities;Therapeutic exercise;Balance training;Neuromuscular re-education;Patient/family education    PT Goals (Current goals can be found in the Care Plan section)  Acute Rehab PT Goals Patient Stated Goal: to return home PT Goal Formulation: With patient Time For Goal Achievement: 10/17/22 Potential to Achieve Goals: Fair    Frequency Min 3X/week     Co-evaluation               AM-PAC PT "6 Clicks" Mobility  Outcome Measure Help needed turning from your back to your side while in a flat bed without using bedrails?: A Little Help needed moving from lying on your back to sitting on the side of a flat bed without using bedrails?: A Little Help  needed moving to and from a bed to a chair (including a wheelchair)?: A Little Help needed standing up from a chair using your arms (e.g., wheelchair or bedside chair)?: A Little Help needed to walk in hospital room?: A Little Help needed climbing 3-5 steps with a railing? : Total 6 Click Score: 16    End of Session   Activity Tolerance: Patient tolerated treatment well Patient left: in bed;with call bell/phone within reach Nurse Communication: Mobility status PT Visit Diagnosis: Other abnormalities of gait and mobility (R26.89);Muscle weakness (generalized) (M62.81)    Time: 1443-1540 PT Time Calculation (min) (ACUTE ONLY): 27 min   Charges:   PT Evaluation $PT Eval Low Complexity: Busby, PT, DPT Acute Rehabilitation Office [phone]   Zenaida Niece 10/03/2022, 4:28 PM

## 2022-10-03 NOTE — ED Notes (Signed)
Placed DNR arm band on pt per order following discussion with pt.

## 2022-10-03 NOTE — ED Notes (Addendum)
Pt has not had any stool since being in my care at 0700, therefore stool samples not yet obtained

## 2022-10-03 NOTE — ED Notes (Signed)
Requested secretary page admitting MD to report pt's temp.

## 2022-10-03 NOTE — ED Notes (Signed)
Admitting MD at beside. 

## 2022-10-04 ENCOUNTER — Encounter (HOSPITAL_COMMUNITY): Payer: Self-pay | Admitting: Internal Medicine

## 2022-10-04 ENCOUNTER — Inpatient Hospital Stay (HOSPITAL_COMMUNITY): Payer: Medicare PPO

## 2022-10-04 DIAGNOSIS — D696 Thrombocytopenia, unspecified: Secondary | ICD-10-CM | POA: Insufficient documentation

## 2022-10-04 DIAGNOSIS — Y92009 Unspecified place in unspecified non-institutional (private) residence as the place of occurrence of the external cause: Secondary | ICD-10-CM

## 2022-10-04 DIAGNOSIS — R197 Diarrhea, unspecified: Secondary | ICD-10-CM

## 2022-10-04 LAB — CBC
HCT: 36.6 % — ABNORMAL LOW (ref 39.0–52.0)
Hemoglobin: 11.9 g/dL — ABNORMAL LOW (ref 13.0–17.0)
MCH: 30.4 pg (ref 26.0–34.0)
MCHC: 32.5 g/dL (ref 30.0–36.0)
MCV: 93.6 fL (ref 80.0–100.0)
Platelets: 94 10*3/uL — ABNORMAL LOW (ref 150–400)
RBC: 3.91 MIL/uL — ABNORMAL LOW (ref 4.22–5.81)
RDW: 13.2 % (ref 11.5–15.5)
WBC: 11.6 10*3/uL — ABNORMAL HIGH (ref 4.0–10.5)
nRBC: 0 % (ref 0.0–0.2)

## 2022-10-04 LAB — BASIC METABOLIC PANEL
Anion gap: 8 (ref 5–15)
BUN: 39 mg/dL — ABNORMAL HIGH (ref 8–23)
CO2: 23 mmol/L (ref 22–32)
Calcium: 8.3 mg/dL — ABNORMAL LOW (ref 8.9–10.3)
Chloride: 106 mmol/L (ref 98–111)
Creatinine, Ser: 2.27 mg/dL — ABNORMAL HIGH (ref 0.61–1.24)
GFR, Estimated: 27 mL/min — ABNORMAL LOW (ref >60)
Glucose, Bld: 89 mg/dL (ref 70–99)
Potassium: 3.9 mmol/L (ref 3.5–5.1)
Sodium: 137 mmol/L (ref 135–145)

## 2022-10-04 LAB — C DIFFICILE QUICK SCREEN W PCR REFLEX
C Diff antigen: NEGATIVE
C Diff interpretation: NOT DETECTED
C Diff toxin: NEGATIVE

## 2022-10-04 LAB — MAGNESIUM: Magnesium: 1.9 mg/dL (ref 1.7–2.4)

## 2022-10-04 MED ORDER — METOPROLOL SUCCINATE ER 25 MG PO TB24
25.0000 mg | ORAL_TABLET | Freq: Every day | ORAL | Status: DC
  Administered 2022-10-04 – 2022-10-06 (×3): 25 mg via ORAL
  Filled 2022-10-04 (×3): qty 1

## 2022-10-04 NOTE — TOC Initial Note (Addendum)
Transition of Care [facility]) - Initial/Assessment Note    Patient Details  Name: Patrick Giles MRN: [id number] Date of Birth: 04/25/1933  Transition of Care [facility]) CM/SW Contact:    Carles Collet, RN Phone Number: 10/04/2022, 2:08 PM  Clinical Narrative:                  Patrick Giles w patient to discuss plans for discharge and support at home. Patient is from home alone, he cites that his family will be assist with his care after DC, and he is agreeable to [facility] services, would like [place] as he has used them in the past. He gave me permission to review DC plan with his Patrick Giles.  Patrick Giles confirmed that he, his wife, and their daughter will be to provide assistance after DC and agree that home w [facility] services is appropriate.  Patrick Giles able to accept referral, AVS updated.   Please place orders and face to face for [facility] PT OT      Patrick Giles (Relative) [phone]    Expected Discharge Plan: [place] Barriers to Discharge: Continued Medical Work up   Patient Goals and CMS Choice Patient states their goals for this hospitalization and ongoing recovery are:: to go home CMS Medicare.gov Compare Post Acute Care list provided to:: Patient Choice offered to / list presented to : Patient      Expected Discharge Plan and Services   Discharge Planning Services: CM Consult Post Acute Care Choice: [place] arrangements for the past 2 months: Single Family Home                           HH Arranged: PT, OT HH Agency: [place] Date [facility] Agency Contacted: 10/04/22 Time HH Agency Contacted: 27 Representative spoke with at [place]: Patrick Giles  Prior Living Arrangements/Services Living arrangements for the past 2 months: [place] with:: Self   Do you feel safe going back to the place where you live?: Yes          Current home services: DME    Activities of Daily Living Home Assistive Devices/Equipment: Environmental consultant (specify type)  (four wheel walker and front wheel walker used) ADL Screening (condition at time of admission) Patient's cognitive ability adequate to safely complete daily activities?: Yes Is the patient deaf or have difficulty hearing?: Yes Does the patient have difficulty seeing, even when wearing glasses/contacts?: No Does the patient have difficulty concentrating, remembering, or making decisions?: No Patient able to express need for assistance with ADLs?: Yes Does the patient have difficulty dressing or bathing?: No Independently performs ADLs?: Yes (appropriate for developmental age) Does the patient have difficulty walking or climbing stairs?: Yes Weakness of Legs: Both Weakness of Arms/Hands: None  Permission Sought/Granted                  Emotional Assessment              Admission diagnosis:  Fall [W19.XXXA] AKI (acute kidney injury) ([place]) [N17.9] Fall in home, initial encounter [W19.Patrick Giles, O17.510] Patient Active Problem List   Diagnosis Date Noted   Thrombocytopenia ([place]) 10/04/2022   Diverticulosis of colon 10/03/2022   Diarrhea 10/03/2022   Weakness 10/03/2022   AKI (acute kidney injury) ([place]) 10/03/2022   Fever 10/03/2022   Atrial fibrillation ([place]) 10/03/2022   Acute CVA (cerebrovascular accident) ([place]) 06/19/2018   CVA (cerebral vascular accident) ([place]) 06/18/2018   Thoracic aortic aneurysm  without rupture ([place]) 01/18/2018   Ascending aorta dilatation ([place]) 01/18/2017   Essential hypertension 07/24/2014   History of DVT (deep vein thrombosis) 07/24/2014   DVT, lower extremity ([place]) 07/23/2014   CAD (coronary artery disease) 11/18/2013   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 [place] [address] Phone: [phone] Fax: [phone]     Social Determinants of Health (SDOH) Social History: [place]: No Food Insecurity (10/04/2022)  Housing: Low Risk  (10/04/2022)  Transportation Needs: No Transportation Needs (10/04/2022)  Utilities: Not At Risk (10/04/2022)  Tobacco Use: Medium Risk (10/04/2022)   SDOH Interventions:     Readmission Risk Interventions     No data to display

## 2022-10-04 NOTE — ED Notes (Signed)
ED TO INPATIENT HANDOFF REPORT  ED Nurse Name and Phone #: Roanna [phone]  S Name/Age/Gender Patrick Giles 89 y.o. male Room/Bed: 032C/032C  Code Status   Code Status: DNR  Home/SNF/Other Home Patient oriented to: self, place, time, and situation Is this baseline? Yes   Triage Complete: Triage complete  Chief Complaint Fall [W19.XXXA]  Triage Note Patient bib [facility] from home after a fall. He was down for 10 hours in the the floor and denies hitting his head or LOC. Upon ems arrival to scene patient was hypotensive 90/40 and complained of being cold. EMS gave normal saline and patients blood pressure came up to normal range. He arrived to the ED shivering and cold to the touch, tachycardic, and with tachypnea. His abdomen is also distended and rigid on the right side. Patient has a history of AAA, and takes eliquis.    Allergies No Known Allergies  Level of Care/Admitting Diagnosis ED Disposition     ED Disposition  Admit   Condition  --   Comment  Hospital Area: [facility] [[id number]]  Level of Care: Telemetry Medical [104]  May admit patient to [facility] or [facility] if equivalent level of care is available:: No  Covid Evaluation: Asymptomatic - no recent exposure (last 10 days) testing not required  Diagnosis: Fall [[id number]]  Admitting Physician: Inez Catalina [phone]  Attending Physician: Inez Catalina [phone]  Certification:: I certify this patient will need inpatient services for at least 2 midnights  Estimated Length of Stay: 3          B Medical/Surgery History Past Medical History:  Diagnosis Date   CAD (coronary artery disease)    Dyslipidemia    Hypertension    S/P CABG (coronary artery bypass graft) 1998   LIMA to LAD, VG to PDA, VG to first diagonal   Stroke [facility])    Past Surgical History:  Procedure Laterality Date   CHOLECYSTECTOMY  1998   CORONARY ARTERY BYPASS GRAFT  07/08/1997   LIMA to LAD, reverse SVG to PDA,  reverse SVG to first diagonal (Dr. Wynetta Fines)   TRANSTHORACIC ECHOCARDIOGRAM  11/29/2012   EF 45-50%, grade 1 diastolic dysfunction     A IV Location/Drains/Wounds Patient Lines/Drains/Airways Status     Active Line/Drains/Airways     Name Placement date Placement time Site Days   Peripheral IV 10/02/22 Right Antecubital 10/02/22  1745  Antecubital  2            Intake/Output Last 24 hours  Intake/Output Summary (Last 24 hours) at 10/04/2022 0106 Last data filed at 10/04/2022 0021 Gross per 24 hour  Intake 1207.01 ml  Output --  Net 1207.01 ml    Labs/Imaging Results for orders placed or performed during the hospital encounter of 10/02/22 (from the past 48 hour(s))  CBC with Differential     Status: Abnormal   Collection Time: 10/02/22  6:37 PM  Result Value Ref Range   WBC 13.2 (H) 4.0 - 10.5 K/uL   RBC 4.42 4.22 - 5.81 MIL/uL   Hemoglobin 13.5 13.0 - 17.0 g/dL   HCT 40.3 47.4 - 25.9 %   MCV 94.1 80.0 - 100.0 fL   MCH 30.5 26.0 - 34.0 pg   MCHC 32.5 30.0 - 36.0 g/dL   RDW 56.3 87.5 - 64.3 %   Platelets 134 (L) 150 - 400 K/uL   nRBC 0.0 0.0 - 0.2 %   Neutrophils Relative % 88 %   Neutro  Abs 11.7 (H) 1.7 - 7.7 K/uL   Lymphocytes Relative 5 %   Lymphs Abs 0.6 (L) 0.7 - 4.0 K/uL   Monocytes Relative 6 %   Monocytes Absolute 0.8 0.1 - 1.0 K/uL   Eosinophils Relative 0 %   Eosinophils Absolute 0.0 0.0 - 0.5 K/uL   Basophils Relative 0 %   Basophils Absolute 0.0 0.0 - 0.1 K/uL   Immature Granulocytes 1 %   Abs Immature Granulocytes 0.07 0.00 - 0.07 K/uL    Comment: Performed at [facility] [address] [place], [place] [address]  Comprehensive metabolic panel     Status: Abnormal   Collection Time: 10/02/22  6:37 PM  Result Value Ref Range   Sodium 137 135 - 145 mmol/L   Potassium 4.7 3.5 - 5.1 mmol/L   Chloride 106 98 - 111 mmol/L   CO2 17 (L) 22 - 32 mmol/L   Glucose, Bld 169 (H) 70 - 99 mg/dL    Comment: Glucose reference range applies only to  samples taken after fasting for at least 8 hours.   BUN 37 (H) 8 - 23 mg/dL   Creatinine, Ser 2.82 (H) 0.61 - 1.24 mg/dL   Calcium 9.2 8.9 - 10.3 mg/dL   Total Protein 5.8 (L) 6.5 - 8.1 g/dL   Albumin 3.0 (L) 3.5 - 5.0 g/dL   AST 48 (H) 15 - 41 U/L   ALT 24 0 - 44 U/L   Alkaline Phosphatase 94 38 - 126 U/L   Total Bilirubin 1.0 0.3 - 1.2 mg/dL   GFR, Estimated 21 (L) >60 mL/min    Comment: (NOTE) Calculated using the CKD-EPI Creatinine Equation (2021)    Anion gap 14 5 - 15    Comment: Performed at [facility], [place] [address], [place], [place] [address]  CK     Status: None   Collection Time: 10/02/22  6:37 PM  Result Value Ref Range   Total CK 386 49 - 397 U/L    Comment: Performed at [facility], [place] [address] [place], [place] [address]  Troponin I (High Sensitivity)     Status: Abnormal   Collection Time: 10/02/22  9:53 PM  Result Value Ref Range   Troponin I (High Sensitivity) 277 (HH) <18 ng/L    Comment: CRITICAL RESULT CALLED TO, READ BACK BY AND VERIFIED WITH C.CARTER,RN. 2249 10/02/22. LPAIT (NOTE) Elevated high sensitivity troponin I (hsTnI) values and significant  changes across serial measurements may suggest ACS but many other  chronic and acute conditions are known to elevate hsTnI results.  Refer to the "Links" section for chest pain algorithms and additional  guidance. Performed at [facility], [place] [address] [place], [place] [address]   Troponin I (High Sensitivity)     Status: Abnormal   Collection Time: 10/03/22 12:03 AM  Result Value Ref Range   Troponin I (High Sensitivity) 290 (HH) <18 ng/L    Comment: CRITICAL VALUE NOTED. VALUE IS CONSISTENT WITH PREVIOUSLY REPORTED/CALLED VALUE (NOTE) Elevated high sensitivity troponin I (hsTnI) values and significant  changes across serial measurements may suggest ACS but many other  chronic and acute conditions are known to elevate hsTnI results.  Refer to the "Links" section for chest pain  algorithms and additional  guidance. Performed at [facility], [place] [address], [place], [place] [address]   Comprehensive metabolic panel     Status: Abnormal   Collection Time: 10/03/22  2:10 AM  Result Value Ref Range   Sodium 136 135 - 145 mmol/L   Potassium 3.9 3.5 - 5.1 mmol/L   Chloride 109 98 - 111 mmol/L   CO2 19 (L) 22 - 32 mmol/L   Glucose, Bld 139 (H) 70 - 99 mg/dL    Comment: Glucose reference range applies only to samples taken after fasting for at least 8 hours.   BUN 36 (H) 8 - 23 mg/dL   Creatinine, Ser 6.04 (H) 0.61 - 1.24 mg/dL   Calcium 7.5 (L) 8.9 - 10.3 mg/dL   Total Protein 4.6 (L) 6.5 - 8.1 g/dL   Albumin 2.3 (L) 3.5 - 5.0 g/dL   AST 35 15 - 41 U/L   ALT 20 0 - 44 U/L   Alkaline Phosphatase 66 38 - 126 U/L   Total Bilirubin 0.6 0.3 - 1.2 mg/dL   GFR, Estimated 26 (L) >60 mL/min    Comment: (NOTE) Calculated using the CKD-EPI Creatinine Equation (2021)    Anion gap 8 5 - 15    Comment: Performed at [facility], [address], [place], [place] [address]  Troponin I (High Sensitivity)     Status: Abnormal   Collection Time: 10/03/22  2:10 AM  Result Value Ref Range   Troponin I (High Sensitivity) 262 (HH) <18 ng/L    Comment: CRITICAL VALUE NOTED. VALUE IS CONSISTENT WITH PREVIOUSLY REPORTED/CALLED VALUE (NOTE) Elevated high sensitivity troponin I (hsTnI) values and significant  changes across serial measurements may suggest ACS but many other  chronic and acute conditions are known to elevate hsTnI results.  Refer to the "Links" section for chest pain algorithms and additional  guidance. Performed at [facility], [address], [place], [place] [address]   Urinalysis, Routine w reflex microscopic Urine, Clean Catch     Status: Abnormal   Collection Time: 10/03/22  2:10 AM  Result Value Ref Range   Color, Urine AMBER (A) YELLOW    Comment: BIOCHEMICALS MAY BE AFFECTED BY COLOR   APPearance HAZY (A) CLEAR   Specific Gravity,  Urine 1.015 1.005 - 1.030   pH 5.0 5.0 - 8.0   Glucose, UA NEGATIVE NEGATIVE mg/dL   Hgb urine dipstick NEGATIVE NEGATIVE   Bilirubin Urine NEGATIVE NEGATIVE   Ketones, ur NEGATIVE NEGATIVE mg/dL   Protein, ur 782 (A) NEGATIVE mg/dL   Nitrite NEGATIVE NEGATIVE   Leukocytes,Ua NEGATIVE NEGATIVE   RBC / HPF 0-5 0 - 5 RBC/hpf   WBC, UA 0-5 0 - 5 WBC/hpf   Bacteria, UA NONE SEEN NONE SEEN   Squamous Epithelial / HPF 0-5 0 - 5 /HPF   Mucus PRESENT    Hyaline Casts, UA PRESENT    Granular Casts, UA PRESENT     Comment: Performed at [facility], [address] [place], [place] [address]  Culture, blood (Routine X 2) w Reflex to ID Panel     Status: None (Preliminary result)   Collection Time: 10/03/22  4:45 AM   Specimen: BLOOD  Result Value Ref Range   Specimen Description BLOOD BLOOD LEFT FOREARM    Special Requests      BOTTLES DRAWN AEROBIC AND ANAEROBIC Blood Culture adequate volume   Culture      NO GROWTH < 12 HOURS Performed at [facility], [address], [place], [place] [address]    Report Status PENDING   Lactic acid, plasma     Status: None   Collection Time: 10/03/22  6:45 AM  Result Value Ref Range   Lactic Acid, Venous 1.3 0.5 - 1.9 mmol/L    Comment: Performed at [facility], [address] [place], [place] [address]  Lactic acid, plasma     Status: None   Collection Time: 10/03/22  8:09 AM  Result Value Ref Range   Lactic Acid, Venous 1.1 0.5 - 1.9 mmol/L    Comment: Performed at [facility], [address] [place], [place] [address]   CT T-SPINE NO CHARGE  Result Date: 10/03/2022 CLINICAL DATA:  Initial evaluation for acute trauma.  Fall. EXAM: CT THORACIC SPINE WITHOUT CONTRAST TECHNIQUE: Multidetector CT images of the thoracic were obtained using the standard protocol without intravenous contrast. RADIATION DOSE REDUCTION: This exam was performed according to the departmental dose-optimization program which includes automated  exposure control, adjustment of the mA and/or kV according to patient size and/or use of iterative reconstruction technique. COMPARISON:  None Available. FINDINGS: Alignment: Mild dextroscoliosis. Alignment otherwise normal with preservation of the normal thoracic kyphosis. No listhesis. Vertebrae: Vertebral body height maintained without acute or chronic fracture. Visualized ribs intact. No discrete or worrisome osseous lesions. Paraspinal and other soft tissues: Paraspinous soft tissues demonstrate no acute finding. Aortic atherosclerosis with scattered coronary artery calcifications. Elevation of the left hemidiaphragm noted. Associated left basilar opacity likely reflects compressive atelectasis. Disc levels: Mild for age degenerative spondylosis within the mid and lower thoracic spine. No significant spinal stenosis by CT. IMPRESSION: 1. No acute osseous injury within the thoracic spine. 2. Elevation of the left hemidiaphragm with associated left basilar opacity, likely atelectasis. Aortic Atherosclerosis (ICD10-I70.0). Electronically Signed   By: Rise MuBenjamin  McClintock M.D.   On: 10/03/2022 00:17   CT L-SPINE NO CHARGE  Result Date: 10/03/2022 CLINICAL DATA:  Initial evaluation for acute trauma, unwitnessed fall. EXAM: CT LUMBAR SPINE WITHOUT CONTRAST TECHNIQUE: Multidetector CT imaging of the lumbar spine was performed without intravenous contrast administration. Multiplanar CT image reconstructions were also generated. RADIATION DOSE REDUCTION: This exam was performed according to the departmental dose-optimization program which includes automated exposure control, adjustment of the mA and/or kV according to patient size and/or use of iterative reconstruction technique. COMPARISON:  Comparison made with concomitant CT of the abdomen and pelvis. FINDINGS: Segmentation: Standard. Lowest well-formed disc space labeled the L5-S1 level. Alignment: Moderate levoscoliosis, apex at L2-3. Trace degenerative  retrolisthesis of L2 on L3 L4, and L4 on L5., L3 on Vertebrae: Vertebral body height maintained without acute or chronic fracture. Visualized sacrum and pelvis intact. No worrisome osseous lesions. Paraspinal and other soft tissues: Paraspinous soft tissues demonstrate no acute finding. Moderate aortic atherosclerosis. Multiple scattered simple cysts noted about the partially visualized kidneys, benign in appearance, no follow-up imaging recommended. Prior cholecystectomy. Disc levels: Underlying moderate multilevel degenerative disc disease, most pronounced at L2-3 through L3-4. Lower lumbar facet hypertrophy. Resultant moderate to severe spinal stenosis at L4-5. IMPRESSION: 1. No acute osseous injury within the lumbar spine. 2. Levoscoliosis with moderate multilevel degenerative disc disease and facet hypertrophy. Resultant moderate to severe spinal stenosis at L4-5. Aortic Atherosclerosis (ICD10-I70.0). Electronically Signed   By: Rise MuBenjamin  McClintock M.D.   On: 10/03/2022 00:14   CT Head Wo Contrast  Result Date: 10/02/2022 CLINICAL DATA:  The patient found down after an unwitnessed fall, reportedly was on the floor for 10 hours before discovery. Suspected blunt polytrauma but denies hitting head. EXAM: CT HEAD WITHOUT CONTRAST CT CERVICAL SPINE WITHOUT CONTRAST CT CHEST, ABDOMEN AND PELVIS WITHOUT CONTRAST TECHNIQUE: Contiguous  axial images were obtained from the base of the skull through the vertex without intravenous contrast. Multidetector CT imaging of the cervical spine was performed without intravenous contrast. Multiplanar CT image reconstructions were also generated. Multidetector CT imaging of the chest, abdomen and pelvis was performed following the standard protocol without IV contrast. RADIATION DOSE REDUCTION: This exam was performed according to the departmental dose-optimization program which includes automated exposure control, adjustment of the mA and/or kV according to patient size and/or  use of iterative reconstruction technique. COMPARISON:  Last brain study was MRI brain 06/18/2018, last neck study with CT angiography neck 06/18/2018, with comparison made to chest CT without contrast 10/06/2021 and an abdomen and pelvis CT with contrast of 05/22/2014. FINDINGS: CT HEAD FINDINGS Brain: There is mild global atrophy for age, mild small-vessel disease of the cerebral white matter, and chronic lacunar infarcts in both gangliocapsular areas left frontoparietal white matter near the vertex. No acute infarct, hemorrhage or mass is seen. Ventricles are normal in size and position. There are benign dural calcifications scattered along side of the falx. Vascular: There are patchy calcifications both carotid siphons, left vertebral artery and basilar artery. No hyperdense central vessel. Skull: No scalp hematoma is seen. The calvarium, skull base and orbits are intact. There is a chronic fracture deformity of the nasal bone. Sinuses/Orbits: Unremarkable orbits and orbital contents. There is diffuse fluid opacification of the right mastoid air cells and middle ear cavity and a small attenuated right ossicular chain. This appears to have been present previously. Moderate patchy fluid in the lower left mastoid air cells is also similar. There is worsening multifocal opacification of the ethmoid air cells. There is increased membrane thickening and bilateral small low-density fluid levels in the maxillary sinuses. The frontal and sphenoid sinus are clear.  Nasal septum is S shaped. Other: None. CT CERVICAL FINDINGS Alignment: Chronic trace degenerative anterolisthesis C3-4, unchanged, and also again noted mild reversed cervical lordosis. There is bone-on-bone anterior atlantodental joint space loss, with small osteophytes, as before. No new or acute alignment abnormality. Skull base and vertebrae: Patient motion limits the sensitivity of the study. No spinal compression fracture or obvious displaced fracture  seen. Osteopenia is noted but no other primary pathologic process of bone is seen. Soft tissues and spinal canal: Limited due to motion. No prevertebral fluid or soft tissue swelling. No obvious canal hematoma. The proximal cervical ICAs are heavily calcified. No thyroid or laryngeal masses seen. Disc levels: Chronic disc collapse C4-5 through C6-7. There are small bidirectional osteophytes but no significant soft tissue or bony encroachment on the thecal sac. Other cervical discs are normal in height. There are facet joint and uncinate osteophytes at all levels. There is moderate to severe degenerative foraminal stenosis at C3-4, but elsewhere no significant foraminal encroachment. Other: None. CT CHEST FINDINGS Cardiovascular: The cardiac size is normal. 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 Stable dilatation of the pulmonary trunk, 3.7 cm indicating chronic arterial hypertension. Aortic atherosclerosis and scattered plaque in the great vessels is also again noted as well as stable ascending aortic dilatation to 4.3 cm. Pulmonary veins are decompressed. Mediastinum/Nodes: No intrathoracic, supraclavicular or axillary adenopathy. Small shotty subcarinal lymph nodes are unchanged. There are calcified right paratracheal nodes chronically. There is no thyroid mass, tracheal or main bronchi filling defect, or esophageal thickening and no hiatal hernia. Lungs/Pleura: Chronic severe elevation left hemidiaphragm consistent with hernia, paresis or large eventration. The dome reaching as high as the aortic arch and  there is chronic overlying atelectasis in the left upper lobe. In the left lower lobe there are chronic compressive changes due to elevated hemidiaphragm or herniation, particularly through the basal segments, but today there is increased opacity which could indicate a superimposed pneumonic infiltrate as compared to the prior studies. There are scattered air bronchograms in the  area. The left lower lobe superior segment remains clear. There are few linear scar-like opacities in the left upper lobe but no infiltrate. There is posterior atelectasis in the right lower lobe but no infiltrate. No nodule is seen. There is no pleural effusion, thickening or pneumothorax. There are small calcified pleural plaques in the posterior basal left chest, calcified right upper lobe granuloma posteriorly on 5:43. Musculoskeletal: There is mild chronic wedging of the T12 vertebral body, osteopenia and degenerative change in the lower thoracic spine. There are healed left rib cage fractures but no acute rib fracture is evident. The sternum is intact. No abnormal focal osseous lesions. CT ABDOMEN AND PELVIS FINDINGS Hepatobiliary: There are scattered calcified hepatic granulomas. Without contrast no hepatic injury or perihepatic hematoma is seen. There is old cholecystectomy without biliary dilatation. Pancreas: No focal abnormality is seen without contrast. Respiratory motion limits fine detail. Spleen: Intrathoracic. No focal abnormality is seen without contrast apart from scattered calcified granulomas. No splenomegaly. Limited fine detail due to breathing motion and streak artifact from the patient's arms in the field. Adrenals/Urinary Tract: There is no adrenal mass. There are numerous bilateral thin walled homogeneous renal cysts including several large cysts, largest on the left is 8.1 cm, largest on the right is in the upper pole approaching 9 cm. The larger of the cysts are slightly larger than in 2015 but no suspicious abnormality is seen in the unenhanced kidneys. There is no urinary stone or obstruction. There is no bladder thickening. Stomach/Bowel: The stomach is intrathoracic as are the distal transverse and proximal descending colon. The gastric thickness is normal. Small bowel is diffusely dilated up to 3.7 cm except in the right mid to lower abdomen where the distal ileal segments and  terminal ileum are decompressed. The focal transition from dilated to decompressed caliber is not identified. The appendix is normal. The large bowel wall is unremarkable proximally. The descending and sigmoid colon show diverticula with wall thickening and stranding alongside the distal descending colon series 3 axial images 78-101, findings consistent with a mild acute distal descending diverticulitis. The sigmoid diverticulosis is uncomplicated. There is moderate retained stool in the distal sigmoid and rectum. No wall thickening in the rectum. Vascular/Lymphatic: Moderate aortoiliac calcific plaque. Interval slight dilatation in the distal infrarenal aorta measuring 2.7 cm. Follow-up imaging at 5 year intervals is recommended. No adenopathy is seen. Reproductive: No prostatomegaly. Central prostatic calcifications are present and there appears to be a TURP defect. Other: There has no free air, free hemorrhage, free fluid or incarcerated hernia. Musculoskeletal: There is levoscoliosis and degenerative changes of the lumbar spine. No spinal compression fracture or other acute regional osseous findings. Bridging osteophytes anterior right SI joint are noted. Mild-to-moderate left and mild right hip DJD. No surface hematoma is seen. IMPRESSION: 1. No acute intracranial CT findings or depressed skull fractures. Chronic change. 2. Left mastoid effusion, with diffuse right mastoid or middle ear opacification and chronic attenuated and small right ossicular chain. This appears to have been present in 2019 also. 3. Osteopenia and degenerative change without evidence of cervical fractures or traumatic listhesis. 4. Chronic severe elevation of left hemidiaphragm consistent with hernia,  paresis or large eventration. 5. Increasing opacity in the basal segments of the left lower lobe which could be due to a superimposed pneumonic infiltrate versus increased compressive changes from the elevated hemidiaphragm. 6. Increasing  ethmoid and maxillary sinus disease with small fluid levels in the maxillary sinuses. Correlate clinically for acute sinusitis. 7. No acute trauma related findings in the chest, abdomen or pelvis. 8. 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 9. Stable dilatation of the pulmonary trunk indicating chronic arterial hypertension. 10. Stable 4.3 cm ascending aortic dilatation. Annual CTA or MRA follow-up recommended. 11. Mild acute distal descending diverticulitis. No free air, free fluid or abscess. 12. Small-bowel obstruction with dilatation to 3.7 cm. The distal ileal segments are decompressed. Etiology could be occult adhesions, occult internal hernia, enteritis or occult mass. A focal transition could not be found. 13. Numerous bilateral renal cysts. 14. Interval slight dilatation in the distal infrarenal aorta measuring 2.7 cm. Follow-up imaging at 5 year intervals is recommended. Aortic Atherosclerosis (ICD10-I70.0). Electronically Signed   By: Almira Bar M.D.   On: 10/02/2022 22:53   CT Cervical Spine Wo Contrast  Result Date: 10/02/2022 CLINICAL DATA:  The patient found down after an unwitnessed fall, reportedly was on the floor for 10 hours before discovery. Suspected blunt polytrauma but denies hitting head. EXAM: CT HEAD WITHOUT CONTRAST CT CERVICAL SPINE WITHOUT CONTRAST CT CHEST, ABDOMEN AND PELVIS WITHOUT CONTRAST TECHNIQUE: Contiguous axial images were obtained from the base of the skull through the vertex without intravenous contrast. Multidetector CT imaging of the cervical spine was performed without intravenous contrast. Multiplanar CT image reconstructions were also generated. Multidetector CT imaging of the chest, abdomen and pelvis was performed following the standard protocol without IV contrast. RADIATION DOSE REDUCTION: This exam was performed according to the departmental dose-optimization program which includes automated exposure control, adjustment of the mA and/or kV  according to patient size and/or use of iterative reconstruction technique. COMPARISON:  Last brain study was MRI brain 06/18/2018, last neck study with CT angiography neck 06/18/2018, with comparison made to chest CT without contrast 10/06/2021 and an abdomen and pelvis CT with contrast of 05/22/2014. FINDINGS: CT HEAD FINDINGS Brain: There is mild global atrophy for age, mild small-vessel disease of the cerebral white matter, and chronic lacunar infarcts in both gangliocapsular areas left frontoparietal white matter near the vertex. No acute infarct, hemorrhage or mass is seen. Ventricles are normal in size and position. There are benign dural calcifications scattered along side of the falx. Vascular: There are patchy calcifications both carotid siphons, left vertebral artery and basilar artery. No hyperdense central vessel. Skull: No scalp hematoma is seen. The calvarium, skull base and orbits are intact. There is a chronic fracture deformity of the nasal bone. Sinuses/Orbits: Unremarkable orbits and orbital contents. There is diffuse fluid opacification of the right mastoid air cells and middle ear cavity and a small attenuated right ossicular chain. This appears to have been present previously. Moderate patchy fluid in the lower left mastoid air cells is also similar. There is worsening multifocal opacification of the ethmoid air cells. There is increased membrane thickening and bilateral small low-density fluid levels in the maxillary sinuses. The frontal and sphenoid sinus are clear.  Nasal septum is S shaped. Other: None. CT CERVICAL FINDINGS Alignment: Chronic trace degenerative anterolisthesis C3-4, unchanged, and also again noted mild reversed cervical lordosis. There is bone-on-bone anterior atlantodental joint space loss, with small osteophytes, as before. No new or acute alignment abnormality. Skull base and  vertebrae: Patient motion limits the sensitivity of the study. No spinal compression fracture  or obvious displaced fracture seen. Osteopenia is noted but no other primary pathologic process of bone is seen. Soft tissues and spinal canal: Limited due to motion. No prevertebral fluid or soft tissue swelling. No obvious canal hematoma. The proximal cervical ICAs are heavily calcified. No thyroid or laryngeal masses seen. Disc levels: Chronic disc collapse C4-5 through C6-7. There are small bidirectional osteophytes but no significant soft tissue or bony encroachment on the thecal sac. Other cervical discs are normal in height. There are facet joint and uncinate osteophytes at all levels. There is moderate to severe degenerative foraminal stenosis at C3-4, but elsewhere no significant foraminal encroachment. Other: None. CT CHEST FINDINGS Cardiovascular: The cardiac size is normal. 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 Stable dilatation of the pulmonary trunk, 3.7 cm indicating chronic arterial hypertension. Aortic atherosclerosis and scattered plaque in the great vessels is also again noted as well as stable ascending aortic dilatation to 4.3 cm. Pulmonary veins are decompressed. Mediastinum/Nodes: No intrathoracic, supraclavicular or axillary adenopathy. Small shotty subcarinal lymph nodes are unchanged. There are calcified right paratracheal nodes chronically. There is no thyroid mass, tracheal or main bronchi filling defect, or esophageal thickening and no hiatal hernia. Lungs/Pleura: Chronic severe elevation left hemidiaphragm consistent with hernia, paresis or large eventration. The dome reaching as high as the aortic arch and there is chronic overlying atelectasis in the left upper lobe. In the left lower lobe there are chronic compressive changes due to elevated hemidiaphragm or herniation, particularly through the basal segments, but today there is increased opacity which could indicate a superimposed pneumonic infiltrate as compared to the prior studies. There are  scattered air bronchograms in the area. The left lower lobe superior segment remains clear. There are few linear scar-like opacities in the left upper lobe but no infiltrate. There is posterior atelectasis in the right lower lobe but no infiltrate. No nodule is seen. There is no pleural effusion, thickening or pneumothorax. There are small calcified pleural plaques in the posterior basal left chest, calcified right upper lobe granuloma posteriorly on 5:43. Musculoskeletal: There is mild chronic wedging of the T12 vertebral body, osteopenia and degenerative change in the lower thoracic spine. There are healed left rib cage fractures but no acute rib fracture is evident. The sternum is intact. No abnormal focal osseous lesions. CT ABDOMEN AND PELVIS FINDINGS Hepatobiliary: There are scattered calcified hepatic granulomas. Without contrast no hepatic injury or perihepatic hematoma is seen. There is old cholecystectomy without biliary dilatation. Pancreas: No focal abnormality is seen without contrast. Respiratory motion limits fine detail. Spleen: Intrathoracic. No focal abnormality is seen without contrast apart from scattered calcified granulomas. No splenomegaly. Limited fine detail due to breathing motion and streak artifact from the patient's arms in the field. Adrenals/Urinary Tract: There is no adrenal mass. There are numerous bilateral thin walled homogeneous renal cysts including several large cysts, largest on the left is 8.1 cm, largest on the right is in the upper pole approaching 9 cm. The larger of the cysts are slightly larger than in 2015 but no suspicious abnormality is seen in the unenhanced kidneys. There is no urinary stone or obstruction. There is no bladder thickening. Stomach/Bowel: The stomach is intrathoracic as are the distal transverse and proximal descending colon. The gastric thickness is normal. Small bowel is diffusely dilated up to 3.7 cm except in the right mid to lower abdomen where  the distal  ileal segments and terminal ileum are decompressed. The focal transition from dilated to decompressed caliber is not identified. The appendix is normal. The large bowel wall is unremarkable proximally. The descending and sigmoid colon show diverticula with wall thickening and stranding alongside the distal descending colon series 3 axial images [id number], findings consistent with a mild acute distal descending diverticulitis. The sigmoid diverticulosis is uncomplicated. There is moderate retained stool in the distal sigmoid and rectum. No wall thickening in the rectum. Vascular/Lymphatic: Moderate aortoiliac calcific plaque. Interval slight dilatation in the distal infrarenal aorta measuring 2.7 cm. Follow-up imaging at 5 year intervals is recommended. No adenopathy is seen. Reproductive: No prostatomegaly. Central prostatic calcifications are present and there appears to be a TURP defect. Other: There has no free air, free hemorrhage, free fluid or incarcerated hernia. Musculoskeletal: There is levoscoliosis and degenerative changes of the lumbar spine. No spinal compression fracture or other acute regional osseous findings. Bridging osteophytes anterior right SI joint are noted. Mild-to-moderate left and mild right hip DJD. No surface hematoma is seen. IMPRESSION: 1. No acute intracranial CT findings or depressed skull fractures. Chronic change. 2. Left mastoid effusion, with diffuse right mastoid or middle ear opacification and chronic attenuated and small right ossicular chain. This appears to have been present in 2019 also. 3. Osteopenia and degenerative change without evidence of cervical fractures or traumatic listhesis. 4. 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 5. Increasing opacity in the basal segments of the left lower lobe which could be due to a superimposed pneumonic infiltrate versus increased compressive changes from the elevated  hemidiaphragm. 6. Increasing ethmoid and maxillary sinus disease with small fluid levels in the maxillary sinuses. Correlate clinically for acute sinusitis. 7. No acute trauma related findings in the chest, abdomen or pelvis. 8. 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 9. Stable dilatation of the pulmonary trunk indicating chronic arterial hypertension. 10. Stable 4.3 cm ascending aortic dilatation. Annual CTA or MRA follow-up recommended. 11. Mild acute distal descending diverticulitis. No free air, free fluid or abscess. 12. Small-bowel obstruction with dilatation to 3.7 cm. The distal ileal segments are decompressed. Etiology could be occult adhesions, occult internal hernia, enteritis or occult mass. A focal transition could not be found. 13. Numerous bilateral renal cysts. 14. Interval slight dilatation in the distal infrarenal aorta measuring 2.7 cm. Follow-up imaging at 5 year intervals is recommended. Aortic Atherosclerosis (ICD10-I70.0). Electronically Signed   By: Almira Bar M.D.   On: 10/02/2022 22:53   CT CHEST ABDOMEN PELVIS WO CONTRAST  Result Date: 10/02/2022 CLINICAL DATA:  The patient found down after an unwitnessed fall, reportedly was on the floor for 10 hours before discovery. Suspected blunt polytrauma but denies hitting head. EXAM: CT HEAD WITHOUT CONTRAST CT CERVICAL SPINE WITHOUT CONTRAST CT CHEST, ABDOMEN AND PELVIS WITHOUT CONTRAST TECHNIQUE: Contiguous axial images were obtained from the base of the skull through the vertex without intravenous contrast. Multidetector CT imaging of the cervical spine was performed without intravenous contrast. Multiplanar CT image reconstructions were also generated. Multidetector CT imaging of the chest, abdomen and pelvis was performed following the standard protocol without IV contrast. RADIATION DOSE REDUCTION: This exam was performed according to the departmental dose-optimization program which includes automated exposure  control, adjustment of the mA and/or kV according to patient size and/or use of iterative reconstruction technique. COMPARISON:  Last brain study was MRI brain 06/18/2018, last neck study with CT angiography neck 06/18/2018, with comparison made to  chest CT without contrast 10/06/2021 and an abdomen and pelvis CT with contrast of 05/22/2014. FINDINGS: CT HEAD FINDINGS Brain: There is mild global atrophy for age, mild small-vessel disease of the cerebral white matter, and chronic lacunar infarcts in both gangliocapsular areas left frontoparietal white matter near the vertex. No acute infarct, hemorrhage or mass is seen. Ventricles are normal in size and position. There are benign dural calcifications scattered along side of the falx. Vascular: There are patchy calcifications both carotid siphons, left vertebral artery and basilar artery. No hyperdense central vessel. Skull: No scalp hematoma is seen. The calvarium, skull base and orbits are intact. There is a chronic fracture deformity of the nasal bone. Sinuses/Orbits: Unremarkable orbits and orbital contents. There is diffuse fluid opacification of the right mastoid air cells and middle ear cavity and a small attenuated right ossicular chain. This appears to have been present previously. Moderate patchy fluid in the lower left mastoid air cells is also similar. There is worsening multifocal opacification of the ethmoid air cells. There is increased membrane thickening and bilateral small low-density fluid levels in the maxillary sinuses. The frontal and sphenoid sinus are clear.  Nasal septum is S shaped. Other: None. CT CERVICAL FINDINGS Alignment: Chronic trace degenerative anterolisthesis C3-4, unchanged, and also again noted mild reversed cervical lordosis. There is bone-on-bone anterior atlantodental joint space loss, with small osteophytes, as before. No new or acute alignment abnormality. Skull base and vertebrae: Patient motion limits the sensitivity of the  study. No spinal compression fracture or obvious displaced fracture seen. Osteopenia is noted but no other primary pathologic process of bone is seen. Soft tissues and spinal canal: Limited due to motion. No prevertebral fluid or soft tissue swelling. No obvious canal hematoma. The proximal cervical ICAs are heavily calcified. No thyroid or laryngeal masses seen. Disc levels: Chronic disc collapse C4-5 through C6-7. There are small bidirectional osteophytes but no significant soft tissue or bony encroachment on the thecal sac. Other cervical discs are normal in height. There are facet joint and uncinate osteophytes at all levels. There is moderate to severe degenerative foraminal stenosis at C3-4, but elsewhere no significant foraminal encroachment. Other: None. CT CHEST FINDINGS Cardiovascular: The cardiac size is normal. 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 Stable dilatation of the pulmonary trunk, 3.7 cm indicating chronic arterial hypertension. Aortic atherosclerosis and scattered plaque in the great vessels is also again noted as well as stable ascending aortic dilatation to 4.3 cm. Pulmonary veins are decompressed. Mediastinum/Nodes: No intrathoracic, supraclavicular or axillary adenopathy. Small shotty subcarinal lymph nodes are unchanged. There are calcified right paratracheal nodes chronically. There is no thyroid mass, tracheal or main bronchi filling defect, or esophageal thickening and no hiatal hernia. Lungs/Pleura: Chronic severe elevation left hemidiaphragm consistent with hernia, paresis or large eventration. The dome reaching as high as the aortic arch and there is chronic overlying atelectasis in the left upper lobe. In the left lower lobe there are chronic compressive changes due to elevated hemidiaphragm or herniation, particularly through the basal segments, but today there is increased opacity which could indicate a superimposed pneumonic infiltrate as compared  to the prior studies. There are scattered air bronchograms in the area. The left lower lobe superior segment remains clear. There are few linear scar-like opacities in the left upper lobe but no infiltrate. There is posterior atelectasis in the right lower lobe but no infiltrate. No nodule is seen. There is no pleural effusion, thickening or pneumothorax. There are small calcified  pleural plaques in the posterior basal left chest, calcified right upper lobe granuloma posteriorly on 5:43. Musculoskeletal: There is mild chronic wedging of the T12 vertebral body, osteopenia and degenerative change in the lower thoracic spine. There are healed left rib cage fractures but no acute rib fracture is evident. The sternum is intact. No abnormal focal osseous lesions. CT ABDOMEN AND PELVIS FINDINGS Hepatobiliary: There are scattered calcified hepatic granulomas. Without contrast no hepatic injury or perihepatic hematoma is seen. There is old cholecystectomy without biliary dilatation. Pancreas: No focal abnormality is seen without contrast. Respiratory motion limits fine detail. Spleen: Intrathoracic. No focal abnormality is seen without contrast apart from scattered calcified granulomas. No splenomegaly. Limited fine detail due to breathing motion and streak artifact from the patient's arms in the field. Adrenals/Urinary Tract: There is no adrenal mass. There are numerous bilateral thin walled homogeneous renal cysts including several large cysts, largest on the left is 8.1 cm, largest on the right is in the upper pole approaching 9 cm. The larger of the cysts are slightly larger than in 2015 but no suspicious abnormality is seen in the unenhanced kidneys. There is no urinary stone or obstruction. There is no bladder thickening. Stomach/Bowel: The stomach is intrathoracic as are the distal transverse and proximal descending colon. The gastric thickness is normal. Small bowel is diffusely dilated up to 3.7 cm except in the  right mid to lower abdomen where the distal ileal segments and terminal ileum are decompressed. The focal transition from dilated to decompressed caliber is not identified. The appendix is normal. The large bowel wall is unremarkable proximally. The descending and sigmoid colon show diverticula with wall thickening and stranding alongside the distal descending colon series 3 axial images 78-101, findings consistent with a mild acute distal descending diverticulitis. The sigmoid diverticulosis is uncomplicated. There is moderate retained stool in the distal sigmoid and rectum. No wall thickening in the rectum. Vascular/Lymphatic: Moderate aortoiliac calcific plaque. Interval slight dilatation in the distal infrarenal aorta measuring 2.7 cm. Follow-up imaging at 5 year intervals is recommended. No adenopathy is seen. Reproductive: No prostatomegaly. Central prostatic calcifications are present and there appears to be a TURP defect. Other: There has no free air, free hemorrhage, free fluid or incarcerated hernia. Musculoskeletal: There is levoscoliosis and degenerative changes of the lumbar spine. No spinal compression fracture or other acute regional osseous findings. Bridging osteophytes anterior right SI joint are noted. Mild-to-moderate left and mild right hip DJD. No surface hematoma is seen. IMPRESSION: 1. No acute intracranial CT findings or depressed skull fractures. Chronic change. 2. Left mastoid effusion, with diffuse right mastoid or middle ear opacification and chronic attenuated and small right ossicular chain. This appears to have been present in 2019 also. 3. Osteopenia and degenerative change without evidence of cervical fractures or traumatic listhesis. 4. 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 5. Increasing opacity in the basal segments of the left lower lobe which could be due to a superimposed pneumonic infiltrate versus increased  compressive changes from the elevated hemidiaphragm. 6. Increasing ethmoid and maxillary sinus disease with small fluid levels in the maxillary sinuses. Correlate clinically for acute sinusitis. 7. No acute trauma related findings in the chest, abdomen or pelvis. 8. 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 9. Stable dilatation of the pulmonary trunk indicating chronic arterial hypertension. 10. Stable 4.3 cm ascending aortic dilatation. Annual CTA or MRA follow-up recommended. 11. Mild acute distal descending diverticulitis. No free air, free  fluid or abscess. 12. Small-bowel obstruction with dilatation to 3.7 cm. The distal ileal segments are decompressed. Etiology could be occult adhesions, occult internal hernia, enteritis or occult mass. A focal transition could not be found. 13. Numerous bilateral renal cysts. 14. Interval slight dilatation in the distal infrarenal aorta measuring 2.7 cm. Follow-up imaging at 5 year intervals is recommended. Aortic Atherosclerosis (ICD10-I70.0). Electronically Signed   By: Almira Bar M.D.   On: 10/02/2022 22:53   DG Shoulder Right  Result Date: 10/02/2022 CLINICAL DATA:  Larey Seat, found down EXAM: RIGHT SHOULDER - 2+ VIEW COMPARISON:  None Available. FINDINGS: Internal rotation, external rotation, and transscapular views of the right shoulder are obtained. No fracture, subluxation, or dislocation. Moderate hypertrophic changes of the acromioclavicular joint. Soft tissues are unremarkable. Right chest is clear. IMPRESSION: 1. No acute displaced fracture. 2. Moderate acromioclavicular joint osteoarthropathy. Electronically Signed   By: Sharlet Salina M.D.   On: 10/02/2022 19:19   DG Pelvis Portable  Result Date: 10/02/2022 CLINICAL DATA:  Larey Seat, found down EXAM: PORTABLE PELVIS 1-2 VIEWS COMPARISON:  None Available. FINDINGS: 2 supine frontal views of the pelvis are obtained, including both hips. No acute displaced fracture. Alignment is anatomic. Mild  symmetrical bilateral hip osteoarthritis. Soft tissues are unremarkable. IMPRESSION: 1. No acute displaced fracture. Electronically Signed   By: Sharlet Salina M.D.   On: 10/02/2022 19:18   DG Chest Port 1 View  Result Date: 10/02/2022 CLINICAL DATA:  Larey Seat, found down EXAM: PORTABLE CHEST 1 VIEW COMPARISON:  11/22/2015 FINDINGS: Single frontal view of the chest demonstrates chronic elevation of the left hemidiaphragm. Cardiac silhouette is stable, with postsurgical changes from prior CABG. Chronic atelectasis at the left lung base. No airspace disease, effusion, or pneumothorax. Left posterolateral sixth and seventh rib fractures are identified, age indeterminate. No other bony abnormalities. IMPRESSION: 1. Age indeterminate left posterolateral sixth and seventh rib fractures. Please correlate with site of patient's pain. 2. Chronic elevation left hemidiaphragm.  No acute airspace disease. Electronically Signed   By: Sharlet Salina M.D.   On: 10/02/2022 19:17    Pending Labs Unresulted Labs (From admission, onward)     Start     Ordered   10/04/22 0500  Basic metabolic panel  Tomorrow morning,   R        10/03/22 1050   10/04/22 0500  CBC  Tomorrow morning,   R        10/03/22 1050   10/04/22 0500  Magnesium  Tomorrow morning,   R        10/03/22 1050   10/03/22 0802  Gastrointestinal Panel by PCR , Stool  (Gastrointestinal Panel by PCR, Stool                                                                                                                                                     **  Does Not include CLOSTRIDIUM DIFFICILE testing. **If CDIFF testing is needed, place order from the "C Difficile Testing" order set.**)  Once,   R        10/03/22 0801   10/03/22 0802  C Difficile Quick Screen w PCR reflex  (C Difficile quick screen w PCR reflex panel )  Once, for 24 hours,   TIMED       References:    CDiff Information Tool   10/03/22 0801   10/03/22 0445  Culture, blood (Routine X 2) w  Reflex to ID Panel  BLOOD CULTURE X 2,   R (with TIMED occurrences)      10/03/22 0445            Vitals/Pain Today's Vitals   10/03/22 2045 10/03/22 2145 10/04/22 0000 10/04/22 0100  BP: 109/63 112/69 (!) 106/58 (!) 107/55  Pulse: 66 (!) 115 (!) 50 (!) 55  Resp: (!) 25 (!) 25 (!) 22 (!) 22  Temp:   98 F (36.7 C)   TempSrc:   Oral   SpO2: 94% 96% 93% 93%  Weight:      Height:      PainSc:        Isolation Precautions Enteric precautions (UV disinfection)  Medications Medications  lactated ringers infusion (0 mLs Intravenous Stopped 10/03/22 1102)  apixaban (ELIQUIS) tablet 2.5 mg (2.5 mg Oral Given 10/03/22 2201)  atorvastatin (LIPITOR) tablet 40 mg (40 mg Oral Given 10/03/22 1021)  Ampicillin-Sulbactam (UNASYN) 3 g in sodium chloride 0.9 % 100 mL IVPB (0 g Intravenous Stopped 10/04/22 0021)  lactated ringers infusion (0 mLs Intravenous Stopped 10/03/22 2045)  sodium chloride 0.9 % bolus 500 mL (0 mLs Intravenous Stopped 10/02/22 2015)  sodium chloride 0.9 % bolus 500 mL ( Intravenous Stopped 10/02/22 2214)  labetalol (NORMODYNE) injection 5 mg (5 mg Intravenous Given 10/02/22 2100)    Mobility walks with person assist High fall risk   Focused Assessments Cardiac Assessment Handoff:  Cardiac Rhythm: Other (Comment) (Sinus arrythmia) Lab Results  Component Value Date   CKTOTAL 386 10/02/2022   No results found for: "DDIMER" Does the Patient currently have chest pain? No    R Recommendations: See Admitting Provider Note  Report given to:   Additional Notes:

## 2022-10-04 NOTE — Progress Notes (Signed)
Spoke with Patient's daughter, Patrick Giles, over the phone and advised her the discharge plan had been discussed with Patrick Giles, patient's stepson, and the case Freight forwarder, Therapist, sports.  Patient will be discharged back home with Home Health to provide PT and OT.  I also advised daughter, Patrick Giles, that it would be best if the family could have one person to be the contact for the nursing staff to update due to multiple family members have been calling and being updated throughout the day.

## 2022-10-04 NOTE — Progress Notes (Addendum)
Interval history Reports another loose bowel movement this morning.  Still does not have much of an appetite. Not passing very much flatus.  Denies nausea, vomiting, abdominal pain.  No heart palpitations, chest pain, shortness of breath.  I spoke to this patient's daughter, [name], on the phone to update her about this patient's condition.  She also provided some collateral history and states that this patient has been less active for the past several months.  She describes that he was recently playing golf frequently, but now he would prefer to sit at home and watch TV.  He also suffered the loss of his wife recently.  She reports that recently he has been spending time in between many of his children's homes rather than spending very much time home alone.  Physical exam Blood pressure 127/78, pulse 97, temperature 97.7 F (36.5 C), temperature source Oral, resp. rate (!) 24, height 5\' 11"  (1.803 m), weight 88 kg, SpO2 95 %.  Male laying in bed in no apparent discomfort Irregular tachycardia, radial pulses 2+, no murmur Breathing is regular and unlabored, lungs are clear to auscultation bilaterally Abdomen is soft and nontender, tympanic to percussion Skin is warm and dry Alert and oriented, no gross neurologic deficits Pleasant, mood and affect are concordant  Telemetry Irregular tachycardia with P waves visible  Labs Reviewed and notable for the following: BUN/creatinine 39/2.27 Magnesium 1.9 Platelets 94, decreasing Blood cultures negative at 1 day  Images and other studies Abdominal x-ray shows diffuse gaseous distention of large and small bowel  Assessment and plan Hospital day 1  Patrick Giles is a 89 y.o. community dwelling person with cardiovascular disease and recent history of URI treated with doxycycline who presents after several days of weakness and diarrhea culminating in a fall with prolonged downtime admitted for treatment of diarrheal illness  versus ileus.  Principal Problem:   Diarrhea Active Problems:   Dyslipidemia   CKD (chronic kidney disease), stage III (HCC)   Diverticulosis of colon   Weakness   AKI (acute kidney injury) ([place])   Fever   Atrial fibrillation (HCC)   Thrombocytopenia (HCC)  Sepsis, resolved Fever, resolved Nonbloody diarrhea Ileus versus partial small bowel obstruction Sepsis and fever have resolved.  Patient continues to have diarrhea.  No nausea or vomiting.  No blood with bowel movements.  Abdomen still tympanic but nontender.  Abdominal x-ray notable for diffuse gaseous distention.  Stool studies still not collected, although the constellation of findings are pointing more towards ileus versus obstruction with overflow diarrhea rather than infectious enterocolitis.  On the whole this patient appears to be improved since yesterday.  Continue supportive therapy for now. - DC ampicillin-sulbactam - Full liquids - GI pathogen panel and C. difficile screening still pending  Weakness Secondary to acute illness and dehydration.  Appreciate PT/OT recommendations.  Will have help from family on discharge home. - Face-to-face for home health ordered  AKI on CKD Stable. - Follow with a.m. BMP  Atrial fibrillation Irregular tachycardia but with P waves observed on the monitor.  Asymptomatic.  Magnesium within normal limits. - Continue Eliquis - Restart home metoprolol succinate 25 mg p.o. daily  Thrombocytopenia No recent history of heparin administration as far as I can tell.  No bleeding.  My impression is that this is secondary to acute illness and hemodilution. - A.m. CBC  Dyslipidemia - Continue home atorvastatin  Diet: Full liquids IVF:  None VTE: Therapeutic Eliquis for A-fib Code: DNR PT/OT recommendations: Home health TOC recommendations: Requires face-to-face order for home health Family Update: Talk to daughter [name] on the phone  Discharge plan: pending treatment of diarrheal  illness versus ileus versus partial SBO  Nani Gasser MD 10/04/2022, 4:07 PM  Pager: [phone] After 5pm on weekdays and 1pm on weekends: [phone]

## 2022-10-04 NOTE — Plan of Care (Signed)

## 2022-10-04 NOTE — Progress Notes (Signed)
Subjective/Chief Complaint: He denies abdominal pain, nausea, or vomiting. Tolerated 2 containers of liquid and jello this morning. He is burping a small amt during my exam. Denies flatus. Reports a loose stool this AM - he is unable to describe it fully because he did not really look.   Denies abd surgery other than cholecystectomy over 20 years ago. Denies known history of diverticulitis or SBO. He has never had a colonoscopy. Denies a known family history of colon cancer. Denies blood in his stools prior to admission.    Objective: Vital signs in last 24 hours: Temp:  [97.7 F (36.5 C)-98.7 F (37.1 C)] 97.7 F (36.5 C) (01/16 0800) Pulse Rate:  [50-115] 97 (01/16 0800) Resp:  [15-25] 24 (01/16 0800) BP: (104-137)/(49-78) 127/78 (01/16 0800) SpO2:  [92 %-97 %] 95 % (01/16 0800) Last BM Date : 10/04/22  Intake/Output from previous day: 01/15 0701 - 01/16 0700 In: 1207 [I.V.:1007.5; IV Piggyback:199.5] Out: -  Intake/Output this shift: Total I/O In: -  Out: 100 [Urine:100]  Exam: Awake, following commands Looks comfortable Abdomen moderately distended and tympanic but very soft and non-tender. He has diastasis recti   Lab Results:  Recent Labs    10/02/22 1837 10/04/22 0540  WBC 13.2* 11.6*  HGB 13.5 11.9*  HCT 41.6 36.6*  PLT 134* 94*   BMET Recent Labs    10/03/22 0210 10/04/22 0540  NA 136 137  K 3.9 3.9  CL 109 106  CO2 19* 23  GLUCOSE 139* 89  BUN 36* 39*  CREATININE 2.38* 2.27*  CALCIUM 7.5* 8.3*   PT/INR No results for input(s): "LABPROT", "INR" in the last 72 hours. ABG No results for input(s): "PHART", "HCO3" in the last 72 hours.  Invalid input(s): "PCO2", "PO2"  Studies/Results: CT T-SPINE NO CHARGE  Result Date: 10/03/2022 CLINICAL DATA:  Initial evaluation for acute trauma.  Fall. EXAM: CT THORACIC SPINE WITHOUT CONTRAST TECHNIQUE: Multidetector CT images of the thoracic were obtained using the standard protocol without intravenous  contrast. RADIATION DOSE REDUCTION: This exam was performed according to the departmental dose-optimization program which includes automated exposure control, adjustment of the mA and/or kV according to patient size and/or use of iterative reconstruction technique. COMPARISON:  None Available. FINDINGS: Alignment: Mild dextroscoliosis. Alignment otherwise normal with preservation of the normal thoracic kyphosis. No listhesis. Vertebrae: Vertebral body height maintained without acute or chronic fracture. Visualized ribs intact. No discrete or worrisome osseous lesions. Paraspinal and other soft tissues: Paraspinous soft tissues demonstrate no acute finding. Aortic atherosclerosis with scattered coronary artery calcifications. Elevation of the left hemidiaphragm noted. Associated left basilar opacity likely reflects compressive atelectasis. Disc levels: Mild for age degenerative spondylosis within the mid and lower thoracic spine. No significant spinal stenosis by CT. IMPRESSION: 1. No acute osseous injury within the thoracic spine. 2. Elevation of the left hemidiaphragm with associated left basilar opacity, likely atelectasis. Aortic Atherosclerosis (ICD10-I70.0). Electronically Signed   By: Jeannine Boga M.D.   On: 10/03/2022 00:17   CT L-SPINE NO CHARGE  Result Date: 10/03/2022 CLINICAL DATA:  Initial evaluation for acute trauma, unwitnessed fall. EXAM: CT LUMBAR SPINE WITHOUT CONTRAST TECHNIQUE: Multidetector CT imaging of the lumbar spine was performed without intravenous contrast administration. Multiplanar CT image reconstructions were also generated. RADIATION DOSE REDUCTION: This exam was performed according to the departmental dose-optimization program which includes automated exposure control, adjustment of the mA and/or kV according to patient size and/or use of iterative reconstruction technique. COMPARISON:  Comparison made with concomitant CT  of the abdomen and pelvis. FINDINGS: Segmentation:  Standard. Lowest well-formed disc space labeled the L5-S1 level. Alignment: Moderate levoscoliosis, apex at L2-3. Trace degenerative retrolisthesis of L2 on L3 L4, and L4 on L5., L3 on Vertebrae: Vertebral body height maintained without acute or chronic fracture. Visualized sacrum and pelvis intact. No worrisome osseous lesions. Paraspinal and other soft tissues: Paraspinous soft tissues demonstrate no acute finding. Moderate aortic atherosclerosis. Multiple scattered simple cysts noted about the partially visualized kidneys, benign in appearance, no follow-up imaging recommended. Prior cholecystectomy. Disc levels: Underlying moderate multilevel degenerative disc disease, most pronounced at L2-3 through L3-4. Lower lumbar facet hypertrophy. Resultant moderate to severe spinal stenosis at L4-5. IMPRESSION: 1. No acute osseous injury within the lumbar spine. 2. Levoscoliosis with moderate multilevel degenerative disc disease and facet hypertrophy. Resultant moderate to severe spinal stenosis at L4-5. Aortic Atherosclerosis (ICD10-I70.0). Electronically Signed   By: Rise Mu M.D.   On: 10/03/2022 00:14   CT Head Wo Contrast  Result Date: 10/02/2022 CLINICAL DATA:  The patient found down after an unwitnessed fall, reportedly was on the floor for 10 hours before discovery. Suspected blunt polytrauma but denies hitting head. EXAM: CT HEAD WITHOUT CONTRAST CT CERVICAL SPINE WITHOUT CONTRAST CT CHEST, ABDOMEN AND PELVIS WITHOUT CONTRAST TECHNIQUE: Contiguous axial images were obtained from the base of the skull through the vertex without intravenous contrast. Multidetector CT imaging of the cervical spine was performed without intravenous contrast. Multiplanar CT image reconstructions were also generated. Multidetector CT imaging of the chest, abdomen and pelvis was performed following the standard protocol without IV contrast. RADIATION DOSE REDUCTION: This exam was performed according to the departmental  dose-optimization program which includes automated exposure control, adjustment of the mA and/or kV according to patient size and/or use of iterative reconstruction technique. COMPARISON:  Last brain study was MRI brain 06/18/2018, last neck study with CT angiography neck 06/18/2018, with comparison made to chest CT without contrast 10/06/2021 and an abdomen and pelvis CT with contrast of 05/22/2014. FINDINGS: CT HEAD FINDINGS Brain: There is mild global atrophy for age, mild small-vessel disease of the cerebral white matter, and chronic lacunar infarcts in both gangliocapsular areas left frontoparietal white matter near the vertex. No acute infarct, hemorrhage or mass is seen. Ventricles are normal in size and position. There are benign dural calcifications scattered along side of the falx. Vascular: There are patchy calcifications both carotid siphons, left vertebral artery and basilar artery. No hyperdense central vessel. Skull: No scalp hematoma is seen. The calvarium, skull base and orbits are intact. There is a chronic fracture deformity of the nasal bone. Sinuses/Orbits: Unremarkable orbits and orbital contents. There is diffuse fluid opacification of the right mastoid air cells and middle ear cavity and a small attenuated right ossicular chain. This appears to have been present previously. Moderate patchy fluid in the lower left mastoid air cells is also similar. There is worsening multifocal opacification of the ethmoid air cells. There is increased membrane thickening and bilateral small low-density fluid levels in the maxillary sinuses. The frontal and sphenoid sinus are clear.  Nasal septum is S shaped. Other: None. CT CERVICAL FINDINGS Alignment: Chronic trace degenerative anterolisthesis C3-4, unchanged, and also again noted mild reversed cervical lordosis. There is bone-on-bone anterior atlantodental joint space loss, with small osteophytes, as before. No new or acute alignment abnormality. Skull base  and vertebrae: Patient motion limits the sensitivity of the study. No spinal compression fracture or obvious displaced fracture seen. Osteopenia is noted but no other primary  pathologic process of bone is seen. Soft tissues and spinal canal: Limited due to motion. No prevertebral fluid or soft tissue swelling. No obvious canal hematoma. The proximal cervical ICAs are heavily calcified. No thyroid or laryngeal masses seen. Disc levels: Chronic disc collapse C4-5 through C6-7. There are small bidirectional osteophytes but no significant soft tissue or bony encroachment on the thecal sac. Other cervical discs are normal in height. There are facet joint and uncinate osteophytes at all levels. There is moderate to severe degenerative foraminal stenosis at C3-4, but elsewhere no significant foraminal encroachment. Other: None. CT CHEST FINDINGS Cardiovascular: The cardiac size is normal. 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 Stable dilatation of the pulmonary trunk, 3.7 cm indicating chronic arterial hypertension. Aortic atherosclerosis and scattered plaque in the great vessels is also again noted as well as stable ascending aortic dilatation to 4.3 cm. Pulmonary veins are decompressed. Mediastinum/Nodes: No intrathoracic, supraclavicular or axillary adenopathy. Small shotty subcarinal lymph nodes are unchanged. There are calcified right paratracheal nodes chronically. There is no thyroid mass, tracheal or main bronchi filling defect, or esophageal thickening and no hiatal hernia. Lungs/Pleura: Chronic severe elevation left hemidiaphragm consistent with hernia, paresis or large eventration. The dome reaching as high as the aortic arch and there is chronic overlying atelectasis in the left upper lobe. In the left lower lobe there are chronic compressive changes due to elevated hemidiaphragm or herniation, particularly through the basal segments, but today there is increased opacity which  could indicate a superimposed pneumonic infiltrate as compared to the prior studies. There are scattered air bronchograms in the area. The left lower lobe superior segment remains clear. There are few linear scar-like opacities in the left upper lobe but no infiltrate. There is posterior atelectasis in the right lower lobe but no infiltrate. No nodule is seen. There is no pleural effusion, thickening or pneumothorax. There are small calcified pleural plaques in the posterior basal left chest, calcified right upper lobe granuloma posteriorly on 5:43. Musculoskeletal: There is mild chronic wedging of the T12 vertebral body, osteopenia and degenerative change in the lower thoracic spine. There are healed left rib cage fractures but no acute rib fracture is evident. The sternum is intact. No abnormal focal osseous lesions. CT ABDOMEN AND PELVIS FINDINGS Hepatobiliary: There are scattered calcified hepatic granulomas. Without contrast no hepatic injury or perihepatic hematoma is seen. There is old cholecystectomy without biliary dilatation. Pancreas: No focal abnormality is seen without contrast. Respiratory motion limits fine detail. Spleen: Intrathoracic. No focal abnormality is seen without contrast apart from scattered calcified granulomas. No splenomegaly. Limited fine detail due to breathing motion and streak artifact from the patient's arms in the field. Adrenals/Urinary Tract: There is no adrenal mass. There are numerous bilateral thin walled homogeneous renal cysts including several large cysts, largest on the left is 8.1 cm, largest on the right is in the upper pole approaching 9 cm. The larger of the cysts are slightly larger than in 2015 but no suspicious abnormality is seen in the unenhanced kidneys. There is no urinary stone or obstruction. There is no bladder thickening. Stomach/Bowel: The stomach is intrathoracic as are the distal transverse and proximal descending colon. The gastric thickness is normal.  Small bowel is diffusely dilated up to 3.7 cm except in the right mid to lower abdomen where the distal ileal segments and terminal ileum are decompressed. The focal transition from dilated to decompressed caliber is not identified. The appendix is normal. The large bowel wall  is unremarkable proximally. The descending and sigmoid colon show diverticula with wall thickening and stranding alongside the distal descending colon series 3 axial images 78-101, findings consistent with a mild acute distal descending diverticulitis. The sigmoid diverticulosis is uncomplicated. There is moderate retained stool in the distal sigmoid and rectum. No wall thickening in the rectum. Vascular/Lymphatic: Moderate aortoiliac calcific plaque. Interval slight dilatation in the distal infrarenal aorta measuring 2.7 cm. Follow-up imaging at 5 year intervals is recommended. No adenopathy is seen. Reproductive: No prostatomegaly. Central prostatic calcifications are present and there appears to be a TURP defect. Other: There has no free air, free hemorrhage, free fluid or incarcerated hernia. Musculoskeletal: There is levoscoliosis and degenerative changes of the lumbar spine. No spinal compression fracture or other acute regional osseous findings. Bridging osteophytes anterior right SI joint are noted. Mild-to-moderate left and mild right hip DJD. No surface hematoma is seen. IMPRESSION: 1. No acute intracranial CT findings or depressed skull fractures. Chronic change. 2. Left mastoid effusion, with diffuse right mastoid or middle ear opacification and chronic attenuated and small right ossicular chain. This appears to have been present in 2019 also. 3. Osteopenia and degenerative change without evidence of cervical fractures or traumatic listhesis. 4. 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 5. Increasing opacity in the basal segments of the left lower lobe which could be due to  a superimposed pneumonic infiltrate versus increased compressive changes from the elevated hemidiaphragm. 6. Increasing ethmoid and maxillary sinus disease with small fluid levels in the maxillary sinuses. Correlate clinically for acute sinusitis. 7. No acute trauma related findings in the chest, abdomen or pelvis. 8. 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 9. Stable dilatation of the pulmonary trunk indicating chronic arterial hypertension. 10. Stable 4.3 cm ascending aortic dilatation. Annual CTA or MRA follow-up recommended. 11. Mild acute distal descending diverticulitis. No free air, free fluid or abscess. 12. Small-bowel obstruction with dilatation to 3.7 cm. The distal ileal segments are decompressed. Etiology could be occult adhesions, occult internal hernia, enteritis or occult mass. A focal transition could not be found. 13. Numerous bilateral renal cysts. 14. Interval slight dilatation in the distal infrarenal aorta measuring 2.7 cm. Follow-up imaging at 5 year intervals is recommended. Aortic Atherosclerosis (ICD10-I70.0). Electronically Signed   By: Telford Nab M.D.   On: 10/02/2022 22:53   CT Cervical Spine Wo Contrast  Result Date: 10/02/2022 CLINICAL DATA:  The patient found down after an unwitnessed fall, reportedly was on the floor for 10 hours before discovery. Suspected blunt polytrauma but denies hitting head. EXAM: CT HEAD WITHOUT CONTRAST CT CERVICAL SPINE WITHOUT CONTRAST CT CHEST, ABDOMEN AND PELVIS WITHOUT CONTRAST TECHNIQUE: Contiguous axial images were obtained from the base of the skull through the vertex without intravenous contrast. Multidetector CT imaging of the cervical spine was performed without intravenous contrast. Multiplanar CT image reconstructions were also generated. Multidetector CT imaging of the chest, abdomen and pelvis was performed following the standard protocol without IV contrast. RADIATION DOSE REDUCTION: This exam was performed according  to the departmental dose-optimization program which includes automated exposure control, adjustment of the mA and/or kV according to patient size and/or use of iterative reconstruction technique. COMPARISON:  Last brain study was MRI brain 06/18/2018, last neck study with CT angiography neck 06/18/2018, with comparison made to chest CT without contrast 10/06/2021 and an abdomen and pelvis CT with contrast of 05/22/2014. FINDINGS: CT HEAD FINDINGS Brain: There is mild global atrophy for age,  mild small-vessel disease of the cerebral white matter, and chronic lacunar infarcts in both gangliocapsular areas left frontoparietal white matter near the vertex. No acute infarct, hemorrhage or mass is seen. Ventricles are normal in size and position. There are benign dural calcifications scattered along side of the falx. Vascular: There are patchy calcifications both carotid siphons, left vertebral artery and basilar artery. No hyperdense central vessel. Skull: No scalp hematoma is seen. The calvarium, skull base and orbits are intact. There is a chronic fracture deformity of the nasal bone. Sinuses/Orbits: Unremarkable orbits and orbital contents. There is diffuse fluid opacification of the right mastoid air cells and middle ear cavity and a small attenuated right ossicular chain. This appears to have been present previously. Moderate patchy fluid in the lower left mastoid air cells is also similar. There is worsening multifocal opacification of the ethmoid air cells. There is increased membrane thickening and bilateral small low-density fluid levels in the maxillary sinuses. The frontal and sphenoid sinus are clear.  Nasal septum is S shaped. Other: None. CT CERVICAL FINDINGS Alignment: Chronic trace degenerative anterolisthesis C3-4, unchanged, and also again noted mild reversed cervical lordosis. There is bone-on-bone anterior atlantodental joint space loss, with small osteophytes, as before. No new or acute alignment  abnormality. Skull base and vertebrae: Patient motion limits the sensitivity of the study. No spinal compression fracture or obvious displaced fracture seen. Osteopenia is noted but no other primary pathologic process of bone is seen. Soft tissues and spinal canal: Limited due to motion. No prevertebral fluid or soft tissue swelling. No obvious canal hematoma. The proximal cervical ICAs are heavily calcified. No thyroid or laryngeal masses seen. Disc levels: Chronic disc collapse C4-5 through C6-7. There are small bidirectional osteophytes but no significant soft tissue or bony encroachment on the thecal sac. Other cervical discs are normal in height. There are facet joint and uncinate osteophytes at all levels. There is moderate to severe degenerative foraminal stenosis at C3-4, but elsewhere no significant foraminal encroachment. Other: None. CT CHEST FINDINGS Cardiovascular: The cardiac size is normal. 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 Stable dilatation of the pulmonary trunk, 3.7 cm indicating chronic arterial hypertension. Aortic atherosclerosis and scattered plaque in the great vessels is also again noted as well as stable ascending aortic dilatation to 4.3 cm. Pulmonary veins are decompressed. Mediastinum/Nodes: No intrathoracic, supraclavicular or axillary adenopathy. Small shotty subcarinal lymph nodes are unchanged. There are calcified right paratracheal nodes chronically. There is no thyroid mass, tracheal or main bronchi filling defect, or esophageal thickening and no hiatal hernia. Lungs/Pleura: Chronic severe elevation left hemidiaphragm consistent with hernia, paresis or large eventration. The dome reaching as high as the aortic arch and there is chronic overlying atelectasis in the left upper lobe. In the left lower lobe there are chronic compressive changes due to elevated hemidiaphragm or herniation, particularly through the basal segments, but today there is  increased opacity which could indicate a superimposed pneumonic infiltrate as compared to the prior studies. There are scattered air bronchograms in the area. The left lower lobe superior segment remains clear. There are few linear scar-like opacities in the left upper lobe but no infiltrate. There is posterior atelectasis in the right lower lobe but no infiltrate. No nodule is seen. There is no pleural effusion, thickening or pneumothorax. There are small calcified pleural plaques in the posterior basal left chest, calcified right upper lobe granuloma posteriorly on 5:43. Musculoskeletal: There is mild chronic wedging of the T12 vertebral body,  osteopenia and degenerative change in the lower thoracic spine. There are healed left rib cage fractures but no acute rib fracture is evident. The sternum is intact. No abnormal focal osseous lesions. CT ABDOMEN AND PELVIS FINDINGS Hepatobiliary: There are scattered calcified hepatic granulomas. Without contrast no hepatic injury or perihepatic hematoma is seen. There is old cholecystectomy without biliary dilatation. Pancreas: No focal abnormality is seen without contrast. Respiratory motion limits fine detail. Spleen: Intrathoracic. No focal abnormality is seen without contrast apart from scattered calcified granulomas. No splenomegaly. Limited fine detail due to breathing motion and streak artifact from the patient's arms in the field. Adrenals/Urinary Tract: There is no adrenal mass. There are numerous bilateral thin walled homogeneous renal cysts including several large cysts, largest on the left is 8.1 cm, largest on the right is in the upper pole approaching 9 cm. The larger of the cysts are slightly larger than in 2015 but no suspicious abnormality is seen in the unenhanced kidneys. There is no urinary stone or obstruction. There is no bladder thickening. Stomach/Bowel: The stomach is intrathoracic as are the distal transverse and proximal descending colon. The  gastric thickness is normal. Small bowel is diffusely dilated up to 3.7 cm except in the right mid to lower abdomen where the distal ileal segments and terminal ileum are decompressed. The focal transition from dilated to decompressed caliber is not identified. The appendix is normal. The large bowel wall is unremarkable proximally. The descending and sigmoid colon show diverticula with wall thickening and stranding alongside the distal descending colon series 3 axial images 78-101, findings consistent with a mild acute distal descending diverticulitis. The sigmoid diverticulosis is uncomplicated. There is moderate retained stool in the distal sigmoid and rectum. No wall thickening in the rectum. Vascular/Lymphatic: Moderate aortoiliac calcific plaque. Interval slight dilatation in the distal infrarenal aorta measuring 2.7 cm. Follow-up imaging at 5 year intervals is recommended. No adenopathy is seen. Reproductive: No prostatomegaly. Central prostatic calcifications are present and there appears to be a TURP defect. Other: There has no free air, free hemorrhage, free fluid or incarcerated hernia. Musculoskeletal: There is levoscoliosis and degenerative changes of the lumbar spine. No spinal compression fracture or other acute regional osseous findings. Bridging osteophytes anterior right SI joint are noted. Mild-to-moderate left and mild right hip DJD. No surface hematoma is seen. IMPRESSION: 1. No acute intracranial CT findings or depressed skull fractures. Chronic change. 2. Left mastoid effusion, with diffuse right mastoid or middle ear opacification and chronic attenuated and small right ossicular chain. This appears to have been present in 2019 also. 3. Osteopenia and degenerative change without evidence of cervical fractures or traumatic listhesis. 4. 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 5. Increasing opacity in the basal segments of the left  lower lobe which could be due to a superimposed pneumonic infiltrate versus increased compressive changes from the elevated hemidiaphragm. 6. Increasing ethmoid and maxillary sinus disease with small fluid levels in the maxillary sinuses. Correlate clinically for acute sinusitis. 7. No acute trauma related findings in the chest, abdomen or pelvis. 8. 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 9. Stable dilatation of the pulmonary trunk indicating chronic arterial hypertension. 10. Stable 4.3 cm ascending aortic dilatation. Annual CTA or MRA follow-up recommended. 11. Mild acute distal descending diverticulitis. No free air, free fluid or abscess. 12. Small-bowel obstruction with dilatation to 3.7 cm. The distal ileal segments are decompressed. Etiology could be occult adhesions, occult internal hernia, enteritis or  occult mass. A focal transition could not be found. 13. Numerous bilateral renal cysts. 14. Interval slight dilatation in the distal infrarenal aorta measuring 2.7 cm. Follow-up imaging at 5 year intervals is recommended. Aortic Atherosclerosis (ICD10-I70.0). Electronically Signed   By: Almira Bar M.D.   On: 10/02/2022 22:53   CT CHEST ABDOMEN PELVIS WO CONTRAST  Result Date: 10/02/2022 CLINICAL DATA:  The patient found down after an unwitnessed fall, reportedly was on the floor for 10 hours before discovery. Suspected blunt polytrauma but denies hitting head. EXAM: CT HEAD WITHOUT CONTRAST CT CERVICAL SPINE WITHOUT CONTRAST CT CHEST, ABDOMEN AND PELVIS WITHOUT CONTRAST TECHNIQUE: Contiguous axial images were obtained from the base of the skull through the vertex without intravenous contrast. Multidetector CT imaging of the cervical spine was performed without intravenous contrast. Multiplanar CT image reconstructions were also generated. Multidetector CT imaging of the chest, abdomen and pelvis was performed following the standard protocol without IV contrast. RADIATION DOSE  REDUCTION: This exam was performed according to the departmental dose-optimization program which includes automated exposure control, adjustment of the mA and/or kV according to patient size and/or use of iterative reconstruction technique. COMPARISON:  Last brain study was MRI brain 06/18/2018, last neck study with CT angiography neck 06/18/2018, with comparison made to chest CT without contrast 10/06/2021 and an abdomen and pelvis CT with contrast of 05/22/2014. FINDINGS: CT HEAD FINDINGS Brain: There is mild global atrophy for age, mild small-vessel disease of the cerebral white matter, and chronic lacunar infarcts in both gangliocapsular areas left frontoparietal white matter near the vertex. No acute infarct, hemorrhage or mass is seen. Ventricles are normal in size and position. There are benign dural calcifications scattered along side of the falx. Vascular: There are patchy calcifications both carotid siphons, left vertebral artery and basilar artery. No hyperdense central vessel. Skull: No scalp hematoma is seen. The calvarium, skull base and orbits are intact. There is a chronic fracture deformity of the nasal bone. Sinuses/Orbits: Unremarkable orbits and orbital contents. There is diffuse fluid opacification of the right mastoid air cells and middle ear cavity and a small attenuated right ossicular chain. This appears to have been present previously. Moderate patchy fluid in the lower left mastoid air cells is also similar. There is worsening multifocal opacification of the ethmoid air cells. There is increased membrane thickening and bilateral small low-density fluid levels in the maxillary sinuses. The frontal and sphenoid sinus are clear.  Nasal septum is S shaped. Other: None. CT CERVICAL FINDINGS Alignment: Chronic trace degenerative anterolisthesis C3-4, unchanged, and also again noted mild reversed cervical lordosis. There is bone-on-bone anterior atlantodental joint space loss, with small  osteophytes, as before. No new or acute alignment abnormality. Skull base and vertebrae: Patient motion limits the sensitivity of the study. No spinal compression fracture or obvious displaced fracture seen. Osteopenia is noted but no other primary pathologic process of bone is seen. Soft tissues and spinal canal: Limited due to motion. No prevertebral fluid or soft tissue swelling. No obvious canal hematoma. The proximal cervical ICAs are heavily calcified. No thyroid or laryngeal masses seen. Disc levels: Chronic disc collapse C4-5 through C6-7. There are small bidirectional osteophytes but no significant soft tissue or bony encroachment on the thecal sac. Other cervical discs are normal in height. There are facet joint and uncinate osteophytes at all levels. There is moderate to severe degenerative foraminal stenosis at C3-4, but elsewhere no significant foraminal encroachment. Other: None. CT CHEST FINDINGS Cardiovascular: The cardiac size is normal. There  is three-vessel calcific CAD, sternotomy sutures and old CABG. Stable dilatation of the pulmonary trunk, 3.7 cm indicating chronic arterial hypertension. Aortic atherosclerosis and scattered plaque in the great vessels is also again noted as well as stable ascending aortic dilatation to 4.3 cm. Pulmonary veins are decompressed. Mediastinum/Nodes: No intrathoracic, supraclavicular or axillary adenopathy. Small shotty subcarinal lymph nodes are unchanged. There are calcified right paratracheal nodes chronically. There is no thyroid mass, tracheal or main bronchi filling defect, or esophageal thickening and no hiatal hernia. Lungs/Pleura: Chronic severe elevation left hemidiaphragm consistent with hernia, paresis or large eventration. The dome reaching as high as the aortic arch and there is chronic overlying atelectasis in the left upper lobe. In the left lower lobe there are chronic compressive changes due to elevated hemidiaphragm or herniation, particularly  through the basal segments, but today there is increased opacity which could indicate a superimposed pneumonic infiltrate as compared to the prior studies. There are scattered air bronchograms in the area. The left lower lobe superior segment remains clear. There are few linear scar-like opacities in the left upper lobe but no infiltrate. There is posterior atelectasis in the right lower lobe but no infiltrate. No nodule is seen. There is no pleural effusion, thickening or pneumothorax. There are small calcified pleural plaques in the posterior basal left chest, calcified right upper lobe granuloma posteriorly on 5:43. Musculoskeletal: There is mild chronic wedging of the T12 vertebral body, osteopenia and degenerative change in the lower thoracic spine. There are healed left rib cage fractures but no acute rib fracture is evident. The sternum is intact. No abnormal focal osseous lesions. CT ABDOMEN AND PELVIS FINDINGS Hepatobiliary: There are scattered calcified hepatic granulomas. Without contrast no hepatic injury or perihepatic hematoma is seen. There is old cholecystectomy without biliary dilatation. Pancreas: No focal abnormality is seen without contrast. Respiratory motion limits fine detail. Spleen: Intrathoracic. No focal abnormality is seen without contrast apart from scattered calcified granulomas. No splenomegaly. Limited fine detail due to breathing motion and streak artifact from the patient's arms in the field. Adrenals/Urinary Tract: There is no adrenal mass. There are numerous bilateral thin walled homogeneous renal cysts including several large cysts, largest on the left is 8.1 cm, largest on the right is in the upper pole approaching 9 cm. The larger of the cysts are slightly larger than in 2015 but no suspicious abnormality is seen in the unenhanced kidneys. There is no urinary stone or obstruction. There is no bladder thickening. Stomach/Bowel: The stomach is intrathoracic as are the distal  transverse and proximal descending colon. The gastric thickness is normal. Small bowel is diffusely dilated up to 3.7 cm except in the right mid to lower abdomen where the distal ileal segments and terminal ileum are decompressed. The focal transition from dilated to decompressed caliber is not identified. The appendix is normal. The large bowel wall is unremarkable proximally. The descending and sigmoid colon show diverticula with wall thickening and stranding alongside the distal descending colon series 3 axial images 78-101, findings consistent with a mild acute distal descending diverticulitis. The sigmoid diverticulosis is uncomplicated. There is moderate retained stool in the distal sigmoid and rectum. No wall thickening in the rectum. Vascular/Lymphatic: Moderate aortoiliac calcific plaque. Interval slight dilatation in the distal infrarenal aorta measuring 2.7 cm. Follow-up imaging at 5 year intervals is recommended. No adenopathy is seen. Reproductive: No prostatomegaly. Central prostatic calcifications are present and there appears to be a TURP defect. Other: There has no free air, free hemorrhage, free  fluid or incarcerated hernia. Musculoskeletal: There is levoscoliosis and degenerative changes of the lumbar spine. No spinal compression fracture or other acute regional osseous findings. Bridging osteophytes anterior right SI joint are noted. Mild-to-moderate left and mild right hip DJD. No surface hematoma is seen. IMPRESSION: 1. No acute intracranial CT findings or depressed skull fractures. Chronic change. 2. Left mastoid effusion, with diffuse right mastoid or middle ear opacification and chronic attenuated and small right ossicular chain. This appears to have been present in 2019 also. 3. Osteopenia and degenerative change without evidence of cervical fractures or traumatic listhesis. 4. 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 5. Increasing  opacity in the basal segments of the left lower lobe which could be due to a superimposed pneumonic infiltrate versus increased compressive changes from the elevated hemidiaphragm. 6. Increasing ethmoid and maxillary sinus disease with small fluid levels in the maxillary sinuses. Correlate clinically for acute sinusitis. 7. No acute trauma related findings in the chest, abdomen or pelvis. 8. 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 9. Stable dilatation of the pulmonary trunk indicating chronic arterial hypertension. 10. Stable 4.3 cm ascending aortic dilatation. Annual CTA or MRA follow-up recommended. 11. Mild acute distal descending diverticulitis. No free air, free fluid or abscess. 12. Small-bowel obstruction with dilatation to 3.7 cm. The distal ileal segments are decompressed. Etiology could be occult adhesions, occult internal hernia, enteritis or occult mass. A focal transition could not be found. 13. Numerous bilateral renal cysts. 14. Interval slight dilatation in the distal infrarenal aorta measuring 2.7 cm. Follow-up imaging at 5 year intervals is recommended. Aortic Atherosclerosis (ICD10-I70.0). Electronically Signed   By: Almira Bar M.D.   On: 10/02/2022 22:53   DG Shoulder Right  Result Date: 10/02/2022 CLINICAL DATA:  Larey Seat, found down EXAM: RIGHT SHOULDER - 2+ VIEW COMPARISON:  None Available. FINDINGS: Internal rotation, external rotation, and transscapular views of the right shoulder are obtained. No fracture, subluxation, or dislocation. Moderate hypertrophic changes of the acromioclavicular joint. Soft tissues are unremarkable. Right chest is clear. IMPRESSION: 1. No acute displaced fracture. 2. Moderate acromioclavicular joint osteoarthropathy. Electronically Signed   By: Sharlet Salina M.D.   On: 10/02/2022 19:19   DG Pelvis Portable  Result Date: 10/02/2022 CLINICAL DATA:  Larey Seat, found down EXAM: PORTABLE PELVIS 1-2 VIEWS COMPARISON:  None Available. FINDINGS: 2  supine frontal views of the pelvis are obtained, including both hips. No acute displaced fracture. Alignment is anatomic. Mild symmetrical bilateral hip osteoarthritis. Soft tissues are unremarkable. IMPRESSION: 1. No acute displaced fracture. Electronically Signed   By: Sharlet Salina M.D.   On: 10/02/2022 19:18   DG Chest Port 1 View  Result Date: 10/02/2022 CLINICAL DATA:  Larey Seat, found down EXAM: PORTABLE CHEST 1 VIEW COMPARISON:  11/22/2015 FINDINGS: Single frontal view of the chest demonstrates chronic elevation of the left hemidiaphragm. Cardiac silhouette is stable, with postsurgical changes from prior CABG. Chronic atelectasis at the left lung base. No airspace disease, effusion, or pneumothorax. Left posterolateral sixth and seventh rib fractures are identified, age indeterminate. No other bony abnormalities. IMPRESSION: 1. Age indeterminate left posterolateral sixth and seventh rib fractures. Please correlate with site of patient's pain. 2. Chronic elevation left hemidiaphragm.  No acute airspace disease. Electronically Signed   By: Sharlet Salina M.D.   On: 10/02/2022 19:17    Anti-infectives: Anti-infectives (From admission, onward)    Start     Dose/Rate Route Frequency Ordered Stop   10/03/22 1030  Ampicillin-Sulbactam (UNASYN) 3 g in sodium chloride 0.9 % 100 mL IVPB        3 g 200 mL/hr over 30 Minutes Intravenous Every 12 hours 10/03/22 1028         Assessment/Plan: S/p Fall, found down after 10 hours, dilated small bowel and possible mild diverticulitis on CT  Given his abdominal exam, I suspect this is an ileus vs pSBO. Clinically improving and having BMs but minimal flatus.   No plans for surgery at this point.  FLD.  If emesis develops, will need and NG We will follow   LOS: 1 day    Francine Graven [facility] 10/04/2022

## 2022-10-04 NOTE — Evaluation (Signed)
Occupational Therapy Evaluation Patient Details Name: Patrick Giles MRN: [id number] DOB: 10/14/1932 Today's Date: 10/04/2022   History of Present Illness 89 y.o. male presents to [facility] hospital on 10/02/2022 with weakness, diarrhea, and a fall off the commode. Imaging notable for ileus vs diverticulitis. PMH includes CAD, HTN, HLD, CVA, afib.   Clinical Impression   Patient admitted for the diagnosis above.  PTA he lives alone, but does have a daughter in the [place] area.  Patient stating daughter could help some, but her daughter just had a baby.  Currently he is needing light Min A to [employer] for mobility and ADL completion.  OT is indicated in the acute setting to address the deficits listed below, and assist with an eventual return home.  [facility] OT can be considered depending on progress.       Recommendations for follow up therapy are one component of a multi-disciplinary discharge planning process, led by the attending physician.  Recommendations may be updated based on patient status, additional functional criteria and insurance authorization.   Follow Up Recommendations  Other (comment) (Can consider HH OT depending on progress)     Assistance Recommended at Discharge Intermittent Supervision/Assistance  Patient can return home with the following Assist for transportation;Assistance with cooking/housework;A little help with walking and/or transfers;A little help with bathing/dressing/bathroom    Functional Status Assessment  Patient has had a recent decline in their functional status and demonstrates the ability to make significant improvements in function in a reasonable and predictable amount of time.  Equipment Recommendations  None recommended by OT    Recommendations for Other Services       Precautions / Restrictions Precautions Precautions: Fall Precaution Comments: tachycardic/afib Restrictions Weight Bearing Restrictions: No      Mobility Bed Mobility Overal bed  mobility: Needs Assistance Bed Mobility: Supine to Sit     Supine to sit: Min assist, HOB elevated          Transfers Overall transfer level: Needs assistance Equipment used: Rolling walker (2 wheels), None Transfers: Sit to/from Stand, Bed to chair/wheelchair/BSC Sit to Stand: Min guard     Step pivot transfers: Min guard            Balance Overall balance assessment: Needs assistance Sitting-balance support: No upper extremity supported, Feet supported Sitting balance-Leahy Scale: Good     Standing balance support: Bilateral upper extremity supported, Reliant on assistive device for balance Standing balance-Leahy Scale: Poor                             ADL either performed or assessed with clinical judgement   ADL       Grooming: Min guard;Standing               Lower Body Dressing: Min guard;Sit to/from stand   Toilet Transfer: Financial planner (2 wheels)             General ADL Comments: Increased SOB with activity     Vision Patient Visual Report: No change from baseline       Perception     Praxis      Pertinent Vitals/Pain Pain Assessment Pain Assessment: No/denies pain     Hand Dominance Right   Extremity/Trunk Assessment Upper Extremity Assessment Upper Extremity Assessment: Generalized weakness   Lower Extremity Assessment Lower Extremity Assessment: Defer to PT evaluation   Cervical / Trunk Assessment Cervical / Trunk Assessment: Kyphotic   Communication  Communication Communication: HOH   Cognition Arousal/Alertness: Awake/alert Behavior During Therapy: WFL for tasks assessed/performed Overall Cognitive Status: Within Functional Limits for tasks assessed Area of Impairment: Problem solving                             Problem Solving: Slow processing, Requires verbal cues       General Comments   Remains with HR up to 140    Exercises     Shoulder Instructions       Home Living Family/patient expects to be discharged to:: Private residence Living Arrangements: Alone Available Help at Discharge: Family;Available PRN/intermittently Type of Home: House Home Access: Level entry     Home Layout: Multi-level Alternate Level Stairs-Number of Steps: 2 Alternate Level Stairs-Rails: Right Bathroom Shower/Tub: Teacher, early years/pre: Standard Bathroom Accessibility: Yes How Accessible: Accessible via walker Home Equipment: Rolling Walker (2 wheels);Rollator (4 wheels);Shower seat          Prior Functioning/Environment Prior Level of Function : Independent/Modified Independent             Mobility Comments: ambulates with support of RW ADLs Comments: No assist with ADL or iADL.        OT Problem List: Impaired balance (sitting and/or standing);Decreased activity tolerance;Decreased strength;Decreased safety awareness      OT Treatment/Interventions: Self-care/ADL training;Therapeutic exercise;Therapeutic activities;Patient/family education;Balance training;Energy conservation    OT Goals(Current goals can be found in the care plan section) Acute Rehab OT Goals Patient Stated Goal: Feel better and get back home OT Goal Formulation: With patient Time For Goal Achievement: 10/18/22 Potential to Achieve Goals: Good ADL Goals Pt Will Perform Grooming: with modified independence;standing Pt Will Perform Lower Body Dressing: with modified independence;sit to/from stand Pt Will Transfer to Toilet: with modified independence;ambulating;regular height toilet Pt/caregiver will Perform Home Exercise Program: Increased strength;Both right and left upper extremity;With theraband;With Supervision  OT Frequency: Min 2X/week    Co-evaluation              AM-PAC OT "6 Clicks" Daily Activity     Outcome Measure Help from another person eating meals?: None Help from another person taking care of personal grooming?: A Little Help  from another person toileting, which includes using toliet, bedpan, or urinal?: A Little Help from another person bathing (including washing, rinsing, drying)?: A Little Help from another person to put on and taking off regular upper body clothing?: None Help from another person to put on and taking off regular lower body clothing?: A Little 6 Click Score: 20   End of Session Equipment Utilized During Treatment: Rolling walker (2 wheels);Gait belt Nurse Communication: Mobility status  Activity Tolerance: Patient tolerated treatment well Patient left: in chair;with call bell/phone within reach;with chair alarm set  OT Visit Diagnosis: Unsteadiness on feet (R26.81);Muscle weakness (generalized) (M62.81)                Time: 1610-9604 OT Time Calculation (min): 21 min Charges:  OT General Charges $OT Visit: 1 Visit OT Evaluation $OT Eval Moderate Complexity: 1 Mod  10/04/2022  RP, OTR/L  Acute Rehabilitation Services  Office:  [phone]   Metta Clines 10/04/2022, 11:46 AM

## 2022-10-04 NOTE — ED Notes (Signed)
IM provider notified of patient new heart rhythm changes on monitor

## 2022-10-05 LAB — GASTROINTESTINAL PANEL BY PCR, STOOL (REPLACES STOOL CULTURE)

## 2022-10-05 LAB — CBC
HCT: 34.8 % — ABNORMAL LOW (ref 39.0–52.0)
Hemoglobin: 11.6 g/dL — ABNORMAL LOW (ref 13.0–17.0)
MCH: 30.9 pg (ref 26.0–34.0)
MCHC: 33.3 g/dL (ref 30.0–36.0)
MCV: 92.6 fL (ref 80.0–100.0)
Platelets: 97 10*3/uL — ABNORMAL LOW (ref 150–400)
RBC: 3.76 MIL/uL — ABNORMAL LOW (ref 4.22–5.81)
RDW: 12.7 % (ref 11.5–15.5)
WBC: 7.5 10*3/uL (ref 4.0–10.5)
nRBC: 0 % (ref 0.0–0.2)

## 2022-10-05 LAB — BASIC METABOLIC PANEL
Anion gap: 8 (ref 5–15)
BUN: 30 mg/dL — ABNORMAL HIGH (ref 8–23)
CO2: 22 mmol/L (ref 22–32)
Calcium: 8.4 mg/dL — ABNORMAL LOW (ref 8.9–10.3)
Chloride: 106 mmol/L (ref 98–111)
Creatinine, Ser: 1.81 mg/dL — ABNORMAL HIGH (ref 0.61–1.24)
GFR, Estimated: 36 mL/min — ABNORMAL LOW (ref >60)
Glucose, Bld: 93 mg/dL (ref 70–99)
Potassium: 3.7 mmol/L (ref 3.5–5.1)
Sodium: 136 mmol/L (ref 135–145)

## 2022-10-05 LAB — MAGNESIUM: Magnesium: 1.8 mg/dL (ref 1.7–2.4)

## 2022-10-05 MED ORDER — MAGNESIUM SULFATE 2 GM/50ML IV SOLN
2.0000 g | Freq: Once | INTRAVENOUS | Status: AC
  Administered 2022-10-05: 2 g via INTRAVENOUS
  Filled 2022-10-05: qty 50

## 2022-10-05 MED ORDER — POTASSIUM CHLORIDE CRYS ER 20 MEQ PO TBCR
40.0000 meq | EXTENDED_RELEASE_TABLET | Freq: Once | ORAL | Status: AC
  Administered 2022-10-05: 40 meq via ORAL
  Filled 2022-10-05: qty 2

## 2022-10-05 NOTE — Progress Notes (Signed)
   Subjective/Chief Complaint: Denies abdominal pain, nausea, belching, or vomiting. Reports small episodes of flatus today. Last BM yesterday AM. Tolerating liquids.   Objective: Vital signs in last 24 hours: Temp:  [98.5 F (36.9 C)] 98.5 F (36.9 C) (01/16 2358) Pulse Rate:  [59-89] 77 (01/16 2358) Resp:  [14-25] 23 (01/16 2358) BP: (105-138)/(59-81) 105/62 (01/17 0800) SpO2:  [92 %-97 %] 93 % (01/16 2358) Last BM Date : 10/04/22  Intake/Output from previous day: 01/16 0701 - 01/17 0700 In: -  Out: 100 [Urine:100] Intake/Output this shift: Total I/O In: 120 [P.O.:120] Out: 350 [Urine:350]  Exam: Awake, following commands Looks comfortable Abdomen with some distention but is soft and non-tender. No guarding. He has diastasis recti   Lab Results:  Recent Labs    10/04/22 0540 10/05/22 0816  WBC 11.6* 7.5  HGB 11.9* 11.6*  HCT 36.6* 34.8*  PLT 94* 97*   BMET Recent Labs    10/04/22 0540 10/05/22 0816  NA 137 136  K 3.9 3.7  CL 106 106  CO2 23 22  GLUCOSE 89 93  BUN 39* 30*  CREATININE 2.27* 1.81*  CALCIUM 8.3* 8.4*   PT/INR No results for input(s): "LABPROT", "INR" in the last 72 hours. ABG No results for input(s): "PHART", "HCO3" in the last 72 hours.  Invalid input(s): "PCO2", "PO2"  Studies/Results: DG Abd Portable 1V  Result Date: 10/04/2022 CLINICAL DATA:  Small bowel obstruction. EXAM: PORTABLE ABDOMEN - 1 VIEW COMPARISON:  CT scan 10/02/2022 FINDINGS: Asymmetric elevation left hemidiaphragm again noted. Diffuse gaseous distension of large and small bowel evident with small bowel loops measuring up to 3.8 cm diameter. Gas is visible in the right, transverse, and left colon. Bones are diffusely demineralized with convex leftward lumbar scoliosis. IMPRESSION: Diffuse gaseous distension of large and small bowel with small bowel loops measuring up to 3.8 cm diameter. Gas is visible in the right, transverse, and left colon. Imaging features suggest  ileus although evolving small bowel obstruction not excluded. Electronically Signed   By: Misty Stanley M.D.   On: 10/04/2022 13:38    Anti-infectives: Anti-infectives (From admission, onward)    Start     Dose/Rate Route Frequency Ordered Stop   10/03/22 1030  Ampicillin-Sulbactam (UNASYN) 3 g in sodium chloride 0.9 % 100 mL IVPB  Status:  Discontinued        3 g 200 mL/hr over 30 Minutes Intravenous Every 12 hours 10/03/22 1028 10/04/22 1320       Assessment/Plan: S/p Fall, found down after 10 hours, dilated small bowel and possible mild diverticulitis on CT  Suspect this was an ileus. If he did have pSBO it is now resolving as he is passing flatus and had 2 BMs yesterday. He is tolerating PO. Advance to SOFT diet. CCS will sign off. Please call as needed.    LOS: 2 days    Darci Current [facility] 10/05/2022

## 2022-10-05 NOTE — Progress Notes (Signed)
Interval history The patient thinks he is passing some flatus and is still having diarrhea. Denies any fevers, chills, nausea, vomiting, abd swelling, or abd pain. His last BM was last night. He has only had some juices to drink and does not feel very hungry (but usually has a better appetite).   Physical exam Blood pressure (!) 127/57, pulse (!) 55, temperature 98.1 F (36.7 C), temperature source Oral, resp. rate (!) 23, height 5\' 11"  (1.803 m), weight 88 kg, SpO2 93 %.  Sleeping in bed Heart rate and rhythm are regular, bilateral radial pulses 2+ Breathing is regular and unlabored, lungs are clear to auscultation Abdomen is soft, some tenderness to deep palpation in the left lower quadrant, tympanic in all quadrants to percussion Skin is warm and dry No gross neurologic deficits Mood is down, affect is concordant  Intake/Output Summary (Last 24 hours) at 10/05/2022 1528 Last data filed at 10/05/2022 1251 Gross per 24 hour  Intake 520 ml  Output 650 ml  Net -130 ml    Labs Potassium 3.7, stable Creatinine 1.81, decreasing Magnesium 1.8, stable Hemoglobin, WBCs, platelets are stable C. difficile and stool pathogen panel negative  Assessment and plan Hospital day 2  Patrick Giles is a 89 y.o. admitted for fall in setting of few days of weakness, nausea, and vomiting, overall presentation is concerning for ileus versus partial small bowel obstruction that appears to be resolving.  Principal Problem:   Diarrhea Active Problems:   Dyslipidemia   CKD (chronic kidney disease), stage III (HCC)   Diverticulosis of colon   Weakness   AKI (acute kidney injury) ([place])   Atrial fibrillation (HCC)   Thrombocytopenia (HCC)  Nonbloody diarrhea Ileus versus partial small bowel obstruction Passing gas had a couple more episodes of diarrhea.  No nausea or vomiting.  Still does not have much of an appetite.  Abdominal exam is notable for gaseous distention but otherwise  benign.  C. difficile and stool pathogen panel negative.  The cause of this patient's ileus versus partial small bowel bowel obstruction is as of yet undetermined. - Advance to soft diet - Serial abdominal exams - Maintain potassium greater than 4 and magnesium greater than 2  Weakness Secondary to acute illness and dehydration.  Appreciate PT/OT recommendations.  Will have help from family on discharge home. - Face-to-face for home health ordered  AKI on CKD Stable. - Follow with a.m. BMP  Atrial fibrillation Rate under better control today.  Normal sinus rhythm. - Continue Eliquis - Continue metoprolol succinate 25 mg p.o. daily  Thrombocytopenia Stable.  No bleeding. - A.m. CBC  Dyslipidemia - Continue home atorvastatin  Diet: Full liquids IVF: None VTE: Therapeutic Eliquis for A-fib Code: DNR PT/OT recommendations: Home health  Discharge plan: pending treatment of diarrheal illness versus ileus versus partial SBO  Nani Gasser MD 10/05/2022, 3:28 PM  Pager: [phone] After 5pm on weekdays and 1pm on weekends: [phone]

## 2022-10-05 NOTE — Progress Notes (Signed)
Physical Therapy Treatment Patient Details Name: Patrick Giles MRN: [id number] DOB: 03-Nov-1932 Today's Date: 10/05/2022   History of Present Illness 89 y.o. male presents to [facility] hospital on 10/02/2022 with weakness, diarrhea, and a fall off the commode. Imaging notable for ileus vs diverticulitis. PMH includes CAD, HTN, HLD, CVA, afib.    PT Comments    Pt tolerated today's session well, able to ambulate in the hallway with min guard and RW. Pt provided cueing for upright posture, forward gaze, and proximity to RW, intermittently compliant with cueing. Pt completing 5 sit<>stand trials with min guard, performing proper hand placement from chair to/from RW. Pt will continue to benefit from skilled acute PT at this time to progress mobility, discharge recommendations remain appropriate.    Recommendations for follow up therapy are one component of a multi-disciplinary discharge planning process, led by the attending physician.  Recommendations may be updated based on patient status, additional functional criteria and insurance authorization.  Follow Up Recommendations  Home health PT     Assistance Recommended at Discharge Intermittent Supervision/Assistance  Patient can return home with the following A little help with walking and/or transfers;A little help with bathing/dressing/bathroom;Assistance with cooking/housework;Assist for transportation;Help with stairs or ramp for entrance   Equipment Recommendations  None recommended by PT    Recommendations for Other Services       Precautions / Restrictions Precautions Precautions: Fall Precaution Comments: tachycardic/afib Restrictions Weight Bearing Restrictions: No     Mobility  Bed Mobility               General bed mobility comments: pt seated in chair upon arrival of PT, left in chair at end of session    Transfers Overall transfer level: Needs assistance Equipment used: Rolling walker (2 wheels) Transfers: Sit  to/from Stand Sit to Stand: Min guard           General transfer comment: min guard for safety provided to pt with transfers, verbal cueing for reaching back for chair prior to sitting, pt compliant with cues    Ambulation/Gait Ambulation/Gait assistance: Min guard Gait Distance (Feet): 150 Feet Assistive device: Rolling walker (2 wheels) Gait Pattern/deviations: Step-to pattern, Drifts right/left Gait velocity: decreased     General Gait Details: decreased B foot clearance and step length, intermittently progressing to step through gait pattern, downward gaze   Stairs             Wheelchair Mobility    Modified Rankin (Stroke Patients Only)       Balance Overall balance assessment: Needs assistance Sitting-balance support: No upper extremity supported, Feet supported Sitting balance-Leahy Scale: Good Sitting balance - Comments: no sway noted with static and dynamic sitting   Standing balance support: Bilateral upper extremity supported, During functional activity Standing balance-Leahy Scale: Fair Standing balance comment: minor LOB noted during today's session but pt able to self-correct                            Cognition Arousal/Alertness: Awake/alert Behavior During Therapy: WFL for tasks assessed/performed Overall Cognitive Status: Within Functional Limits for tasks assessed                               Problem Solving: Slow processing, Requires verbal cues General Comments: minimal verbal cueing to complete tasks        Exercises Other Exercises Other Exercises: 5x sit<>stand  General Comments General comments (skin integrity, edema, etc.): VSS throughout      Pertinent Vitals/Pain Pain Assessment Pain Assessment: No/denies pain    Home Living                          Prior Function            PT Goals (current goals can now be found in the care plan section) Acute Rehab PT Goals Patient Stated  Goal: to return home PT Goal Formulation: With patient Time For Goal Achievement: 10/17/22 Potential to Achieve Goals: Fair Progress towards PT goals: Progressing toward goals    Frequency    Min 3X/week      PT Plan Current plan remains appropriate    Co-evaluation              AM-PAC PT "6 Clicks" Mobility   Outcome Measure  Help needed turning from your back to your side while in a flat bed without using bedrails?: A Little Help needed moving from lying on your back to sitting on the side of a flat bed without using bedrails?: A Little Help needed moving to and from a bed to a chair (including a wheelchair)?: A Little Help needed standing up from a chair using your arms (e.g., wheelchair or bedside chair)?: A Little Help needed to walk in hospital room?: A Little Help needed climbing 3-5 steps with a railing? : A Lot 6 Click Score: 17    End of Session   Activity Tolerance: Patient tolerated treatment well Patient left: with call bell/phone within reach;in chair Nurse Communication: Mobility status PT Visit Diagnosis: Other abnormalities of gait and mobility (R26.89);Muscle weakness (generalized) (M62.81)     Time: 8315-1761 PT Time Calculation (min) (ACUTE ONLY): 18 min  Charges:  $Gait Training: 8-22 mins                     Charlynne Cousins, PT DPT Acute Rehabilitation Services Office [phone]    Luvenia Heller 10/05/2022, 1:26 PM

## 2022-10-06 DIAGNOSIS — K567 Ileus, unspecified: Secondary | ICD-10-CM

## 2022-10-06 DIAGNOSIS — Z7689 Persons encountering health services in other specified circumstances: Secondary | ICD-10-CM

## 2022-10-06 DIAGNOSIS — N179 Acute kidney failure, unspecified: Secondary | ICD-10-CM | POA: Diagnosis not present

## 2022-10-06 DIAGNOSIS — R197 Diarrhea, unspecified: Secondary | ICD-10-CM | POA: Diagnosis not present

## 2022-10-06 LAB — BASIC METABOLIC PANEL
Anion gap: 8 (ref 5–15)
BUN: 29 mg/dL — ABNORMAL HIGH (ref 8–23)
CO2: 23 mmol/L (ref 22–32)
Calcium: 8.4 mg/dL — ABNORMAL LOW (ref 8.9–10.3)
Chloride: 106 mmol/L (ref 98–111)
Creatinine, Ser: 1.93 mg/dL — ABNORMAL HIGH (ref 0.61–1.24)
GFR, Estimated: 33 mL/min — ABNORMAL LOW (ref >60)
Glucose, Bld: 119 mg/dL — ABNORMAL HIGH (ref 70–99)
Potassium: 3.8 mmol/L (ref 3.5–5.1)
Sodium: 137 mmol/L (ref 135–145)

## 2022-10-06 LAB — CBC
HCT: 33.6 % — ABNORMAL LOW (ref 39.0–52.0)
Hemoglobin: 11.5 g/dL — ABNORMAL LOW (ref 13.0–17.0)
MCH: 31.2 pg (ref 26.0–34.0)
MCHC: 34.2 g/dL (ref 30.0–36.0)
MCV: 91.1 fL (ref 80.0–100.0)
Platelets: 105 10*3/uL — ABNORMAL LOW (ref 150–400)
RBC: 3.69 MIL/uL — ABNORMAL LOW (ref 4.22–5.81)
RDW: 12.6 % (ref 11.5–15.5)
WBC: 6 10*3/uL (ref 4.0–10.5)
nRBC: 0 % (ref 0.0–0.2)

## 2022-10-06 LAB — MAGNESIUM: Magnesium: 2.3 mg/dL (ref 1.7–2.4)

## 2022-10-06 NOTE — Discharge Instructions (Addendum)
Mr. Patrick Giles  You were admitted for nausea, vomiting, and a fall and treated for a partial small bowel obstruction or an ileus.  It is not clear exactly which one of these problems caused your symptoms, but the treatment for both is the same, and now you are doing better.  To help assist you on your road to recovery, I have written the following recommendations:   Continue eating a softer diet for the next few days.  Monitor your appetite and stool output.  If you stop eating or stop moving your bowels, that is a sign that you need to call your doctor or return to the hospital.  If you have severe nausea, vomiting, or abdominal pain, that is also a sign that you should return to the hospital.  Call your doctor and make an appointment for soon as possible after discharge from the hospital.  Talk to them about continued care for this condition, and ask them whether or not they recommend referral to a gastroenterologist.  Stop taking your amlodipine-benazepril for your blood pressure until you follow-up with your primary care doctor.  It was a privilege to be a part of your hospital care team, and I hope you feel better as a result of your stay.  All the best, Nani Gasser, MD

## 2022-10-06 NOTE — Progress Notes (Signed)
Mobility Specialist Progress Note:   10/06/22 0930  Mobility  Activity Ambulated with assistance in hallway  Level of Assistance Contact guard assist, steadying assist  Assistive Device Front wheel walker  Distance Ambulated (ft) 250 ft  Activity Response Tolerated well  Mobility Referral Yes  $Mobility charge 1 Mobility   Pt agreeable to mobility session. Required minG throughout ambulation. Pt left sitting in chair with all needs met.   Nelta Numbers Mobility Specialist Please contact via SecureChat or  Rehab office at [phone]

## 2022-10-06 NOTE — Progress Notes (Signed)
Discharge teaching completed with patient and family. They deny questions at this time. Patient denies complaints.

## 2022-10-06 NOTE — Discharge Summary (Signed)
Name: Patrick Giles MRN: [id number] DOB: Oct 01, 1932 89 y.o. PCP: Chesley Noon, MD  Date of Admission: 10/02/2022  5:51 PM Date of Discharge: 10/06/2022 5:47 PM Attending Physician: Aldine Contes, MD  Discharge Diagnosis: Principal Problem:   Diarrhea Active Problems:   Dyslipidemia   CKD (chronic kidney disease), stage III ([place])   Diverticulosis of colon   Weakness   AKI (acute kidney injury) ([place])   Atrial fibrillation ([place])   Thrombocytopenia ([place])   Encounter for assessment for small bowel obstruciton   Ileus [facility])   Discharge Medications: Allergies as of 10/06/2022   No Known Allergies      Medication List     STOP taking these medications    amLODipine-benazepril 5-20 MG capsule Commonly known as: LOTREL       TAKE these medications    ALIVE MENS ENERGY PO Take 1 tablet by mouth daily.   atorvastatin 40 MG tablet Commonly known as: LIPITOR TAKE 1 TABLET BY MOUTH EVERY DAY AT 6PM What changed: See the new instructions.   Eliquis 2.5 MG Tabs tablet Generic drug: apixaban TAKE 1 TABLET BY MOUTH TWICE A DAY What changed: how much to take   Fish Oil 1200 MG Caps Take 2 capsules by mouth daily.   GARLIC PO Take 9,024 mg by mouth daily.   metoprolol succinate 25 MG 24 hr tablet Commonly known as: TOPROL-XL Take 25 mg by mouth daily.        Disposition and follow-up:   Patrick Giles is a 89 y.o. year old admitted after a fall preceded by a few days of nausea, vomiting, diarrhea, his workup was concerning for ileus versus partial SBO, he was managed nonoperatively.  Ileus versus partial SBO Underlying etiology was not determined.  Perhaps his unique anatomy with the diaphragmatic elevation on the left side predisposes him to this.  There were no obvious masses on imaging.  He was discharged on a soft diet with return precautions and after a surgical evaluation. - Consider follow up with gastroenterology  Follow-up Appointments:   Follow-up Information     [facility], [facility] Follow up.   Specialty: [place] Why: for home health services Contact information: [place] [address] [phone]         Chesley Noon, MD. Call today.   Specialty: Family Medicine Why: Call for an appointment as soon as possible after discharge Contact information: [place] [address] [phone]                 Hospital Course by problem list:  Ileus versus partial SBO Admitted after a fall preceded by several days of nausea and vomiting with diarrhea. Initial concern was for sepsis due to intra-abdominal illness or diarrheal illness. He received a brief course of Unasyn before it became apparent that some mechanical obstruction was probably the main issue. CT abdomen showed gaseous distention in much of the small bowel without a clear transition point. Surgery was consulted and followed through the patient's hospital course. He was managed non-operatively and his condition improved by hospital day 4. No obvious etiology for ileus or partial SBO was identified prior to resolution.  AKI on CKD Stable after administration of fluids. Creatinine around baseline of 2 on discharge.  Thrombocytopenia and anemia Thought to be secondary to acute illness.  Discharge Exam:  Feeling better, moving bowels, no nausea, vomiting. Good appetite today.   Blood pressure 125/83,  pulse 84, temperature 98.9 F (37.2 C), temperature source Oral, resp. rate (!) 25, height 5\' 11"  (1.803 m), weight 88 kg, SpO2 94 %.  Resting in bed Regular rate and rhythm Breathing regular and unlabored Abdomen soft and non-tender, tympanic to percussion Skin is warm and dry  Pertinent studies and procedures:    Latest Reference Range & Units 10/06/22 03:05  Sodium 135 - 145 mmol/L 137  Potassium 3.5 - 5.1 mmol/L 3.8  Chloride 98 - 111 mmol/L 106  CO2 22 - 32 mmol/L 23  Glucose 70  - 99 mg/dL 119 (H)  BUN 8 - 23 mg/dL 29 (H)  Creatinine 0.61 - 1.24 mg/dL 1.93 (H)  Calcium 8.9 - 10.3 mg/dL 8.4 (L)  Anion gap 5 - 15  8  Magnesium 1.7 - 2.4 mg/dL 2.3  GFR, Estimated >60 mL/min 33 (L)  (H): Data is abnormally high (L): Data is abnormally low   Latest Reference Range & Units 10/06/22 03:05  WBC 4.0 - 10.5 K/uL 6.0  RBC 4.22 - 5.81 MIL/uL 3.69 (L)  Hemoglobin 13.0 - 17.0 g/dL 11.5 (L)  HCT 39.0 - 52.0 % 33.6 (L)  MCV 80.0 - 100.0 fL 91.1  MCH 26.0 - 34.0 pg 31.2  MCHC 30.0 - 36.0 g/dL 34.2  RDW 11.5 - 15.5 % 12.6  Platelets 150 - 400 K/uL 105 (L)  nRBC 0.0 - 0.2 % 0.0  (L): Data is abnormally low  CT head chest abdomen pelvis: IMPRESSION: 1. No acute intracranial CT findings or depressed skull fractures. Chronic change. 2. Left mastoid effusion, with diffuse right mastoid or middle ear opacification and chronic attenuated and small right ossicular chain. This appears to have been present in 2019 also. 3. Osteopenia and degenerative change without evidence of cervical fractures or traumatic listhesis. 4. 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 5. Increasing opacity in the basal segments of the left lower lobe which could be due to a superimposed pneumonic infiltrate versus increased compressive changes from the elevated hemidiaphragm. 6. Increasing ethmoid and maxillary sinus disease with small fluid levels in the maxillary sinuses. Correlate clinically for acute sinusitis. 7. No acute trauma related findings in the chest, abdomen or pelvis. 8. 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 9. Stable dilatation of the pulmonary trunk indicating chronic arterial hypertension. 10. Stable 4.3 cm ascending aortic dilatation. Annual CTA or MRA follow-up recommended. 11. Mild acute distal descending diverticulitis. No free air, free fluid or abscess. 12. Small-bowel obstruction with  dilatation to 3.7 cm. The distal ileal segments are decompressed. Etiology could be occult adhesions, occult internal hernia, enteritis or occult mass. A focal transition could not be found. 13. Numerous bilateral renal cysts. 14. Interval slight dilatation in the distal infrarenal aorta measuring 2.7 cm. Follow-up imaging at 5 year intervals is recommended.  Discharge Instructions:   Discharge Instructions      Patrick Giles  You were admitted for nausea, vomiting, and a fall and treated for a partial small bowel obstruction or an ileus.  It is not clear exactly which one of these problems caused your symptoms, but the treatment for both is the same, and now you are doing better.  To help assist you on your road to recovery, I have written the following recommendations:   Continue eating a softer diet for the next few days.  Monitor your appetite and stool output.  If you stop eating or stop moving your bowels, that is  a sign that you need to call your doctor or return to the hospital.  If you have severe nausea, vomiting, or abdominal pain, that is also a sign that you should return to the hospital.  Call your doctor and make an appointment for soon as possible after discharge from the hospital.  Talk to them about continued care for this condition, and ask them whether or not they recommend referral to a gastroenterologist.  Stop taking your amlodipine-benazepril for your blood pressure until you follow-up with your primary care doctor.  It was a privilege to be a part of your hospital care team, and I hope you feel better as a result of your stay.  All the best, Marrianne Mood, MD     Signed: Marrianne Mood MD 10/06/2022, 5:47 PM   Pager: [phone]

## 2022-10-06 NOTE — Plan of Care (Signed)

## 2022-10-06 NOTE — Progress Notes (Signed)
Mobility Specialist Progress Note:   10/06/22 0940  Mobility  Activity Ambulated with assistance to bathroom  Level of Assistance Contact guard assist, steadying assist  Assistive Device Front wheel walker  Distance Ambulated (ft) 30 ft  Activity Response Tolerated well  Mobility Referral Yes  $Mobility charge 1 Mobility   Called by pt back into room shortly after session to go to BR. Required minG to ambulate with RW. Pt back in chair with all needs met.   Nelta Numbers Mobility Specialist Please contact via SecureChat or  Rehab office at [phone]

## 2022-10-06 NOTE — Plan of Care (Signed)
Patient is to discharge home to care for self

## 2022-10-08 LAB — CULTURE, BLOOD (ROUTINE X 2)
Culture: NO GROWTH
Special Requests: ADEQUATE

## 2023-03-17 NOTE — Progress Notes (Signed)
Cardiology Office Note:    Date:  03/27/2023   ID:  Nelly Laurence, DOB 03-25-1933, MRN [id number]  PCP:  Eartha Inch, MD  Cardiologist:  Chrystie Nose, MD  Electrophysiologist:  None   Referring MD: Eartha Inch, MD   Chief Complaint: follow-up of CAD, ischemic cardiomyopathy, and atrial fibrillation  History of Present Illness:    Patrick Giles is a 89 y.o. male with a history of CAD s/p remote CABG x3 (LIMA-LAD, SVG-Diag, SVG-PDA) in 1998, ischemic cardiomyopathy with EF of 40-45% on Echo in 2017 but gross normal on Echo in 2019, paroxysmal atrial fibrillation on Eliquis, ascending thoracic aortic aneurysm (stable at 4.3 cm on chest CT in 09/2022), stroke in 2019,  hypertension, hyperlipidemia, and CKD stage IIIb who is followed by Dr. Rennis Golden and presents today for routine follow-up.   Patient has a history of CAD s/p remote CABG x3 with LIMA to LAD, SVG to 1st Diag, and SVG to PDA in 1998. He had a nuclear stress test in 2006 which showed a fixed inferoapical defect. This was again present on stress test in 2010 suggesting that he may have lost a vein graft possible to the PDA and fixed defect was suggestive of scar. He also has known mildly ischemic cardiomyopathy. Echo in 2017 showed LVEF of 40-45% with diffuse hypokinesis and grade 1 diastolic dysfunction as well as a dilated ascending aorta measuring 4.4 cm. He was admitted in 06/2018 for a stroke. Echo at that time had extremely poor acoustic windows which significantly limited study but EF looked grossly normal. Outpatient monitor in 05/2019 showed sinus rhythm with PVCs and >1,700 narrow complex tachycardic episodes with no clear P waves suspicious for atrial fibrillation. Therefore, he was started on Eliquis.   He was last seen by Dr. Rennis Golden in 11/2021 at which time he was doing well with no chest pain or worsening shortness of breath. He was in atrial fibrillation at that time but was rate controlled. No medication changes  were made.   Since last visit, he was admitted in 09/2022 after presenting with a fall that was preceded by several days of nausea, vomiting, and diarrhea. CT showed gaseous distention in much of the small bowel without a clear transition point but was treated conservatively for ileus vs partial small bowel obstruction.   Patient presents today for follow-up. Here with daughter-in-law. He is doing well from a cardiac standpoint. He denies any chest pain, shortness of breath, orthopnea, or PND. He denies any significant lower extremity edema. He does have some trace edema on exam and daughter-in-law states he will sometimes wear compression stockings which help. He remains in rate controlled atrial fibrillation today. He is asymptomatic with this. No palpitations, lightheadedness, dizziness, or syncope. His BP is mildly elevated in the office today but patient's states he was nervous about his appointment today.   EKGs/Labs/Other Studies Reviewed:    The following studies were reviewed:  Echocardiogram 01/07/2016: Study Conclusions: - Left ventricle: The cavity size was normal. Wall thickness was    normal. Systolic function was mildly to moderately reduced. The    estimated ejection fraction was in the range of 40% to 45%.    Diffuse hypokinesis. Doppler parameters are consistent with    abnormal left ventricular relaxation (grade 1 diastolic    dysfunction).  - Aortic root: The aortic root was mildly dilated.  - Ascending aorta: The ascending aorta was mildly dilated.   Impressions:  - Technically difficult; definity  used; EF difficult to quantitate    but with definity appears to be mild to moderately reduced; grade    1 diastolic dysfunction; right heart not well visualized.    Ascending aorta measures 4.4 cm; suggest CTA or MRA to further    assess.  _______________  Echocardiogram 06/18/2018: Study Conclusions: - Left ventricle: LV function is grossly normal. The cavity size    was  normal. Wall thickness was normal.  - Impressions: Extremely poor acoustic windows limit study    signficantly. Consider other method of evaluateion (TEE, CT, MRI)    if clinically indicated. WOuld , at least, recomm CT evaluation    of aorta at some point as it appears to be mildly dilated. Could    not accurately messure in present study.   Impressions: - Extremely poor acoustic windows limit study signficantly.    Consider other method of evaluateion (TEE, CT, MRI) if clinically    indicated. WOuld , at least, recomm CT evaluation of aorta at    some point as it appears to be mildly dilated. Could not    accurately messure in present study.  _______________  Monitor 05/2019: Sinus with PVC's, brief NSVT and >1700 "SVT" epidoses - there are no clear p-waves and I suspect this is afib, however, the longest episode duration was 17 beats, however, episodes frequently occurred - HR increased up to 194.   EKG:  EKG ordered today.  EKG Interpretation Date/Time:  Monday March 27 2023 08:20:19 EDT Ventricular Rate:  86 PR Interval:    QRS Duration:  96 QT Interval:  392 QTC Calculation: 469 R Axis:   45  Text Interpretation: Atrial fibrillation with PVC Non-specific ST-t changes When compared with ECG of 04-Oct-2022 00:52, No acute changes Confirmed by Marjie Skiff [phone]) on 03/27/2023 8:27:38 AM    Recent Labs: 10/03/2022: ALT 20 10/06/2022: BUN 29; Creatinine, Ser 1.93; Hemoglobin 11.5; Magnesium 2.3; Platelets 105; Potassium 3.8; Sodium 137  Recent Lipid Panel    Component Value Date/Time   CHOL 135 06/19/2018 0437   CHOL 147 01/18/2018 0932   TRIG 115 06/19/2018 0437   HDL 44 06/19/2018 0437   HDL 54 01/18/2018 0932   CHOLHDL 3.1 06/19/2018 0437   VLDL 23 06/19/2018 0437   LDLCALC 68 06/19/2018 0437   LDLCALC 63 01/18/2018 0932    Physical Exam:    Vital Signs: BP (!) 154/78   Pulse 86   Ht 6' (1.829 m)   Wt 209 lb 12.8 oz (95.2 kg)   SpO2 97%   BMI 28.45 kg/m      Wt Readings from Last 3 Encounters:  03/27/23 209 lb 12.8 oz (95.2 kg)  10/02/22 194 lb 0.1 oz (88 kg)  12/16/21 195 lb (88.5 kg)     General: 89 y.o. Caucasian male in no acute distress. HEENT: Normocephalic and atraumatic. Sclera clear.  Neck: Supple. No carotid bruits. No JVD. Heart: Irregularly irregular rhythm with normal rate. No murmurs, gallops, or rubs.  Lungs: No increased work of breathing. Clear to ausculation bilaterally. No wheezes, rhonchi, or rales.  Abdomen: Soft, non-distended, and non-tender to palpation.   Extremities: Trace lower extremity edema bilaterally.    Skin: Warm and dry. Neuro: Alert and oriented x3. No focal deficits. Psych: Normal affect. Responds appropriately.   Assessment:    1. Coronary artery disease involving native coronary artery of native heart without angina pectoris   2. S/P CABG (coronary artery bypass graft)   3. Ischemic cardiomyopathy  4. Persistent atrial fibrillation (HCC)   5. Thoracic aortic aneurysm without rupture, unspecified part (HCC)   6. Primary hypertension   7. Hyperlipidemia, unspecified hyperlipidemia type   8. Stage 3b chronic kidney disease (HCC)     Plan:    CAD s/p CABG History of remote CABG x3 in 1998. Subsequent nuclear stress test in 2006 and 2010 showed fixed inferoapical defect felt to possible be due to the loss of the SVG to PDA graft. - No chest pain.  - No aspirin due to need for anticoagulation.  - Continue statin.  Ischemic Cardiomyopathy EF as low as 40-45% in 2017. However, most recent Echo in 2019 showed LV function was grossly normal (although acoustic windows were extremely poor). - Weight is up 14 lbs from last visit in 11/2022. However, he does not appear volume overload on exam. Only has trace lower extremity edema. - Continue Toprol-XL 25mg  daily.  - He has not been on ACEi/ ARB/ ARNI, MRA, or SGLT2 inhibitor. Will not add given advanced age, improved EF, and renal function. -  Continue compression stockings as needed for lower extremity edema.  Persistent Atrial Fibrillation Initially noted on monitor in 2020.  - Rate controlled. - Continue Toprol-XL 25mg  daily. - Continue Eliquis 2.5mg  twice daily. Reduced dose due to age and renal function. - Plan has been for rate control given he is asymptomatic with this.   Thoracic Aortic Aneurysm Chest CT in 09/2022 showed a stable ascending thoracic aortic aneurysm of 4.3 cm.  - Will repeat chest CT (without contrast given renal function) in 09/2023.   Hypertension BP mildly elevated in the office today. Initially 158/88 and then 154/78 on my personal recheck at the end of the visit. He states he was nervous about his appointment today. - Continue Toprol-XL 25mg  daily. - Patient will monitor BP/HR for 2 weeks and then send this to [place]. If BP consistently >130/80, will adjust medications. Will likely either transition from Toprol-XL to Coreg or add Amlodipine.  Hyperlipidemia - Continue Lipitor 40mg  daily.  - Labs followed by PCP.  CKD Stage IIIb Baseline creatinine around 1.8 to 2.0. Stable at 1.93 on last labs in 09/2022. - Will check BMET today.  Disposition: Follow up in 1 year.    Medication Adjustments/Labs and Tests Ordered: Current medicines are reviewed at length with the patient today.  Concerns regarding medicines are outlined above.  Orders Placed This Encounter  Procedures   CT Chest Wo Contrast   Basic metabolic panel   EKG 12-Lead   No orders of the defined types were placed in this encounter.   Patient Instructions  Medication Instructions:  Your physician recommends that you continue on your current medications as directed. Please refer to the Current Medication list given to you today.  *If you need a refill on your cardiac medications before your next appointment, please call your pharmacy*   Lab Work: LAB ORDERED If you have labs (blood work) drawn today and your tests are completely  normal, you will receive your results only by: MyChart Message (if you have MyChart) OR A paper copy in the mail If you have any lab test that is abnormal or we need to change your treatment, we will call you to review the results.   Testing/Procedures: Non-Cardiac CT scanning, (CAT scanning), is a noninvasive, special x-ray that produces cross-sectional images of the body using x-rays and a computer. CT scans help physicians diagnose and treat medical conditions. For some CT exams, a contrast material  is used to enhance visibility in the area of the body being studied. CT scans provide greater clarity and reveal more details than regular x-ray exams.    Follow-Up: At [facility], you and your health needs are our priority.  As part of our continuing mission to provide you with exceptional heart care, we have created designated Provider Care Teams.  These Care Teams include your primary Cardiologist (physician) and Advanced Practice Providers (APPs -  Physician Assistants and Nurse Practitioners) who all work together to provide you with the care you need, when you need it.  We recommend signing up for the patient portal called "MyChart".  Sign up information is provided on this After Visit Summary.  MyChart is used to connect with patients for Virtual Visits (Telemedicine).  Patients are able to view lab/test results, encounter notes, upcoming appointments, etc.  Non-urgent messages can be sent to your provider as well.   To learn more about what you can do with MyChart, go to [email or web address].    Your next appointment:   1 YEAR  Provider:   Marjie Skiff, PA-C  OR Chrystie Nose, MD will plan to see you again in  .   Other Instructions  Your physician recommends that you continue on your current medications as directed. Please refer to the Current Medication list given to you today.    Signed, Corrin Parker, PA-C  03/27/2023 9:43 AM    [facility]

## 2023-03-27 ENCOUNTER — Ambulatory Visit: Payer: Medicare PPO | Admitting: Student

## 2023-03-27 ENCOUNTER — Encounter: Payer: Self-pay | Admitting: Student

## 2023-03-27 VITALS — BP 154/78 | HR 86 | Ht 72.0 in | Wt 209.8 lb

## 2023-03-27 DIAGNOSIS — N1832 Chronic kidney disease, stage 3b: Secondary | ICD-10-CM

## 2023-03-27 DIAGNOSIS — I10 Essential (primary) hypertension: Secondary | ICD-10-CM

## 2023-03-27 DIAGNOSIS — I255 Ischemic cardiomyopathy: Secondary | ICD-10-CM | POA: Diagnosis not present

## 2023-03-27 DIAGNOSIS — Z951 Presence of aortocoronary bypass graft: Secondary | ICD-10-CM

## 2023-03-27 DIAGNOSIS — I712 Thoracic aortic aneurysm, without rupture, unspecified: Secondary | ICD-10-CM

## 2023-03-27 DIAGNOSIS — E785 Hyperlipidemia, unspecified: Secondary | ICD-10-CM

## 2023-03-27 DIAGNOSIS — I4819 Other persistent atrial fibrillation: Secondary | ICD-10-CM

## 2023-03-27 DIAGNOSIS — I2510 Atherosclerotic heart disease of native coronary artery without angina pectoris: Secondary | ICD-10-CM | POA: Diagnosis not present

## 2023-03-27 LAB — BASIC METABOLIC PANEL
BUN/Creatinine Ratio: 14 (ref 10–24)
BUN: 26 mg/dL (ref 8–27)
CO2: 26 mmol/L (ref 20–29)
Calcium: 10.3 mg/dL — ABNORMAL HIGH (ref 8.6–10.2)
Chloride: 103 mmol/L (ref 96–106)
Creatinine, Ser: 1.88 mg/dL — ABNORMAL HIGH (ref 0.76–1.27)
Glucose: 105 mg/dL — ABNORMAL HIGH (ref 70–99)
Potassium: 4.8 mmol/L (ref 3.5–5.2)
Sodium: 141 mmol/L (ref 134–144)
eGFR: 34 mL/min/{1.73_m2} — ABNORMAL LOW (ref >59)

## 2023-03-27 NOTE — Patient Instructions (Signed)
Medication Instructions:  Your physician recommends that you continue on your current medications as directed. Please refer to the Current Medication list given to you today.  *If you need a refill on your cardiac medications before your next appointment, please call your pharmacy*   Lab Work: LAB ORDERED If you have labs (blood work) drawn today and your tests are completely normal, you will receive your results only by: MyChart Message (if you have MyChart) OR A paper copy in the mail If you have any lab test that is abnormal or we need to change your treatment, we will call you to review the results.   Testing/Procedures: Non-Cardiac CT scanning, (CAT scanning), is a noninvasive, special x-ray that produces cross-sectional images of the body using x-rays and a computer. CT scans help physicians diagnose and treat medical conditions. For some CT exams, a contrast material is used to enhance visibility in the area of the body being studied. CT scans provide greater clarity and reveal more details than regular x-ray exams.    Follow-Up: At [facility], you and your health needs are our priority.  As part of our continuing mission to provide you with exceptional heart care, we have created designated Provider Care Teams.  These Care Teams include your primary Cardiologist (physician) and Advanced Practice Providers (APPs -  Physician Assistants and Nurse Practitioners) who all work together to provide you with the care you need, when you need it.  We recommend signing up for the patient portal called "MyChart".  Sign up information is provided on this After Visit Summary.  MyChart is used to connect with patients for Virtual Visits (Telemedicine).  Patients are able to view lab/test results, encounter notes, upcoming appointments, etc.  Non-urgent messages can be sent to your provider as well.   To learn more about what you can do with MyChart, go to [email or web address].    Your  next appointment:   1 YEAR  Provider:   Marjie Skiff, PA-C  OR Chrystie Nose, MD will plan to see you again in  .   Other Instructions  Your physician recommends that you continue on your current medications as directed. Please refer to the Current Medication list given to you today.

## 2023-04-05 NOTE — Telephone Encounter (Signed)
Looks like patient had one isolated low BP reading but otherwise BP above goal. Let's stop his Toprol-XL and switch him to Coreg 3.125mg  twice daily which usually has a better BP affect. After making this switch, would recommend he keep another BP/HR log for 2 weeks and then send this to [place].  Thank you! Krishang Reading

## 2023-04-06 MED ORDER — CARVEDILOL 3.125 MG PO TABS: 3.1250 mg | ORAL_TABLET | Freq: Two times a day (BID) | ORAL | 3 refills | Status: DC

## 2023-04-07 ENCOUNTER — Telehealth: Payer: Self-pay | Admitting: Student

## 2023-04-07 DIAGNOSIS — I10 Essential (primary) hypertension: Secondary | ICD-10-CM

## 2023-04-07 MED ORDER — AMLODIPINE BESYLATE 5 MG PO TABS: 5.0000 mg | ORAL_TABLET | Freq: Every day | ORAL | 3 refills | Status: DC

## 2023-04-07 NOTE — Telephone Encounter (Signed)
Left voicemail to return call to office.

## 2023-04-07 NOTE — Telephone Encounter (Signed)
Thank you :)

## 2023-04-07 NOTE — Telephone Encounter (Signed)
Pt daughter calling back about mychart message. She states she doesn't have access to mychart right now and ask that someone call her to let her know what [place] decided.

## 2023-04-07 NOTE — Telephone Encounter (Signed)
Daughter was calling to get mychart message that was sent because she is at work and does not have access while at work. Discussed mychart patient with daughter. Patient was on the phone and stated it was ok. She would like to start amlodipine 5 mg daily. She did not pick up carvedilol. He will not start carvedilol. He will not take combination of amlodipine-benazapril 5-20 mg daily.  I will send amlodipine 5 mg to [facility]. He will also take metoprolol 25 mg daily.

## 2023-08-15 ENCOUNTER — Other Ambulatory Visit: Payer: Self-pay

## 2023-08-15 DIAGNOSIS — I10 Essential (primary) hypertension: Secondary | ICD-10-CM

## 2023-08-15 MED ORDER — AMLODIPINE BESYLATE 5 MG PO TABS: 5.0000 mg | ORAL_TABLET | Freq: Every day | ORAL | 2 refills | Status: DC

## 2023-09-21 ENCOUNTER — Ambulatory Visit (HOSPITAL_COMMUNITY)
Admission: RE | Admit: 2023-09-21 | Discharge: 2023-09-21 | Disposition: A | Discharge status: Home / Self Care | Payer: Medicare PPO | Source: Ambulatory Visit | Attending: Student | Admitting: Student

## 2023-09-21 DIAGNOSIS — I712 Thoracic aortic aneurysm, without rupture, unspecified: Secondary | ICD-10-CM | POA: Diagnosis present

## 2023-10-25 ENCOUNTER — Other Ambulatory Visit: Payer: Self-pay

## 2023-10-25 ENCOUNTER — Emergency Department (HOSPITAL_COMMUNITY): Payer: Medicare PPO

## 2023-10-25 ENCOUNTER — Emergency Department (HOSPITAL_COMMUNITY)
Admission: EM | Admit: 2023-10-25 | Discharge: 2023-10-25 | Disposition: A | Discharge status: Home / Self Care | Payer: Medicare PPO | Attending: Emergency Medicine | Admitting: Emergency Medicine

## 2023-10-25 ENCOUNTER — Encounter (HOSPITAL_COMMUNITY): Payer: Self-pay | Admitting: Emergency Medicine

## 2023-10-25 DIAGNOSIS — Z7901 Long term (current) use of anticoagulants: Secondary | ICD-10-CM | POA: Diagnosis not present

## 2023-10-25 DIAGNOSIS — G8911 Acute pain due to trauma: Secondary | ICD-10-CM | POA: Insufficient documentation

## 2023-10-25 DIAGNOSIS — W08XXXA Fall from other furniture, initial encounter: Secondary | ICD-10-CM | POA: Insufficient documentation

## 2023-10-25 DIAGNOSIS — Z951 Presence of aortocoronary bypass graft: Secondary | ICD-10-CM | POA: Diagnosis not present

## 2023-10-25 DIAGNOSIS — Z20822 Contact with and (suspected) exposure to covid-19: Secondary | ICD-10-CM | POA: Insufficient documentation

## 2023-10-25 DIAGNOSIS — M25561 Pain in right knee: Secondary | ICD-10-CM | POA: Diagnosis present

## 2023-10-25 DIAGNOSIS — Y92009 Unspecified place in unspecified non-institutional (private) residence as the place of occurrence of the external cause: Secondary | ICD-10-CM | POA: Diagnosis not present

## 2023-10-25 DIAGNOSIS — W19XXXA Unspecified fall, initial encounter: Secondary | ICD-10-CM

## 2023-10-25 DIAGNOSIS — I4891 Unspecified atrial fibrillation: Secondary | ICD-10-CM | POA: Insufficient documentation

## 2023-10-25 DIAGNOSIS — M25511 Pain in right shoulder: Secondary | ICD-10-CM | POA: Diagnosis not present

## 2023-10-25 DIAGNOSIS — N189 Chronic kidney disease, unspecified: Secondary | ICD-10-CM | POA: Insufficient documentation

## 2023-10-25 DIAGNOSIS — Z8673 Personal history of transient ischemic attack (TIA), and cerebral infarction without residual deficits: Secondary | ICD-10-CM | POA: Diagnosis not present

## 2023-10-25 DIAGNOSIS — I672 Cerebral atherosclerosis: Secondary | ICD-10-CM | POA: Diagnosis not present

## 2023-10-25 LAB — BASIC METABOLIC PANEL
Anion gap: 10 (ref 5–15)
BUN: 24 mg/dL — ABNORMAL HIGH (ref 8–23)
CO2: 21 mmol/L — ABNORMAL LOW (ref 22–32)
Calcium: 9.6 mg/dL (ref 8.9–10.3)
Chloride: 105 mmol/L (ref 98–111)
Creatinine, Ser: 2.1 mg/dL — ABNORMAL HIGH (ref 0.61–1.24)
GFR, Estimated: 30 mL/min — ABNORMAL LOW (ref >60)
Glucose, Bld: 113 mg/dL — ABNORMAL HIGH (ref 70–99)
Potassium: 4 mmol/L (ref 3.5–5.1)
Sodium: 136 mmol/L (ref 135–145)

## 2023-10-25 LAB — CBC WITH DIFFERENTIAL/PLATELET
Abs Immature Granulocytes: 0.03 10*3/uL (ref 0.00–0.07)
Basophils Absolute: 0 10*3/uL (ref 0.0–0.1)
Basophils Relative: 0 %
Eosinophils Absolute: 0 10*3/uL (ref 0.0–0.5)
Eosinophils Relative: 0 %
HCT: 41.3 % (ref 39.0–52.0)
Hemoglobin: 13.6 g/dL (ref 13.0–17.0)
Immature Granulocytes: 0 %
Lymphocytes Relative: 17 %
Lymphs Abs: 1.6 10*3/uL (ref 0.7–4.0)
MCH: 31.1 pg (ref 26.0–34.0)
MCHC: 32.9 g/dL (ref 30.0–36.0)
MCV: 94.3 fL (ref 80.0–100.0)
Monocytes Absolute: 1.2 10*3/uL — ABNORMAL HIGH (ref 0.1–1.0)
Monocytes Relative: 13 %
Neutro Abs: 6.3 10*3/uL (ref 1.7–7.7)
Neutrophils Relative %: 70 %
Platelets: 158 10*3/uL (ref 150–400)
RBC: 4.38 MIL/uL (ref 4.22–5.81)
RDW: 12.2 % (ref 11.5–15.5)
WBC: 9.2 10*3/uL (ref 4.0–10.5)
nRBC: 0 % (ref 0.0–0.2)

## 2023-10-25 LAB — RESP PANEL BY RT-PCR (RSV, FLU A&B, COVID)  RVPGX2
Influenza A by PCR: NEGATIVE
Influenza B by PCR: NEGATIVE
Resp Syncytial Virus by PCR: NEGATIVE
SARS Coronavirus 2 by RT PCR: NEGATIVE

## 2023-10-25 NOTE — ED Provider Notes (Signed)
  EMERGENCY DEPARTMENT AT [facility] Provider Note   CSN: [id number] Arrival date & time: 10/25/23  1043     History  Chief Complaint  Patient presents with   Fall   Knee Pain    Patrick Giles is a 89 y.o. male.  With a history of status post CABG, A-fib on Eliquis , CVA and CKD presents to the ED after fall.  Patient had an unwitnessed fall at home.  He reports tripping after attempting to stand up and walk his dog.  No head trauma or loss of consciousness.  He was unable to get up under his own power after the fall.  Reports right knee pain and right shoulder pain although both of these were present before the fall.  No other symptoms or complaints.  Denies headaches, neck pain, chest pain, shortness of breath, abdominal pain and pain in other extremities.  Last dose of Eliquis  was this morning   Fall  Knee Pain      Home Medications Prior to Admission medications   Medication Sig Start Date End Date Taking? Authorizing Provider  amLODipine  (NORVASC ) 5 MG tablet Take 1 tablet (5 mg total) by mouth daily. 08/15/23 08/14/24 Yes Hilty, Vinie BROCKS, MD  apixaban  (ELIQUIS ) 2.5 MG TABS tablet TAKE 1 TABLET BY MOUTH TWICE A DAY Patient taking differently: Take 2.5 mg by mouth 2 (two) times daily. 01/06/22  Yes Hilty, Vinie BROCKS, MD  atorvastatin  (LIPITOR ) 40 MG tablet TAKE 1 TABLET BY MOUTH EVERY DAY AT 6PM Patient taking differently: Take 40 mg by mouth at bedtime. 02/19/18  Yes Hilty, Vinie BROCKS, MD  Garlic 1000 MG CAPS Take 1,000 mg by mouth daily.   Yes [provider]  metoprolol  succinate (TOPROL -XL) 25 MG 24 hr tablet Take 25 mg by mouth at bedtime.   Yes [provider]  Multiple Vitamins-Minerals (ALIVE MENS ENERGY PO) Take 1 tablet by mouth daily with breakfast.   Yes [provider]  Omega-3 Fatty Acids (FISH OIL) 1000 MG CAPS Take 1,000 mg by mouth in the morning.   Yes [provider]  carvedilol  (COREG ) 3.125 MG tablet Take  1 tablet (3.125 mg total) by mouth 2 (two) times daily. Patient not taking: Reported on 10/25/2023 04/06/23 10/25/23  Goodrich, Callie E, PA-C      Allergies    Patient has no known allergies.    Review of Systems   Review of Systems  Physical Exam Updated Vital Signs BP (!) 142/95   Pulse 62   Temp 97.9 F (36.6 C) (Oral)   Resp 16   SpO2 96%  Physical Exam Vitals and nursing note reviewed.  HENT:     Head: Normocephalic and atraumatic.  Eyes:     Pupils: Pupils are equal, round, and reactive to light.  Cardiovascular:     Rate and Rhythm: Normal rate and regular rhythm.  Pulmonary:     Effort: Pulmonary effort is normal.     Breath sounds: Normal breath sounds.  Abdominal:     Palpations: Abdomen is soft.     Tenderness: There is no abdominal tenderness.  Musculoskeletal:     Cervical back: Neck supple. No tenderness.     Comments: No midline tenderness step-off or deformity back Limited range of motion with flexion of right knee with some mild anterior tenderness no swelling or evidence of trauma Limited range of motion with abduction and some light tenderness to palpation of right anterior shoulder 5 out of 5 motor  strength otherwise in bilateral upper and lower extremities Sensation tact light touch throughout Distal pulses intact in upper and lower extremities  Skin:    General: Skin is warm and dry.  Neurological:     General: No focal deficit present.     Mental Status: He is alert.     Sensory: No sensory deficit.     Motor: No weakness.  Psychiatric:        Mood and Affect: Mood normal.     ED Results / Procedures / Treatments   Labs (all labs ordered are listed, but only abnormal results are displayed) Labs Reviewed  BASIC METABOLIC PANEL - Abnormal; Notable for the following components:      Result Value   CO2 21 (*)    Glucose, Bld 113 (*)    BUN 24 (*)    Creatinine, Ser 2.10 (*)    GFR, Estimated 30 (*)    All other components within normal  limits  CBC WITH DIFFERENTIAL/PLATELET - Abnormal; Notable for the following components:   Monocytes Absolute 1.2 (*)    All other components within normal limits  RESP PANEL BY RT-PCR (RSV, FLU A&B, COVID)  [id number]    EKG EKG Interpretation Date/Time:  Wednesday October 25 2023 13:39:59 EST Ventricular Rate:  81 PR Interval:    QRS Duration:  130 QT Interval:  390 QTC Calculation: 453 R Axis:   231  Text Interpretation: Atrial fibrillation with premature ventricular or aberrantly conducted complexes Right bundle branch block Abnormal ECG When compared with ECG of 27-Mar-2023 08:20, PREVIOUS ECG IS PRESENT Confirmed by Pamella Sharper [phone]) on 10/25/2023 2:45:08 PM  Radiology CT Head Wo Contrast Result Date: 10/25/2023 CLINICAL DATA:  Head trauma, minor (Age >= 65y); Neck trauma (Age >= 65y) EXAM: CT HEAD WITHOUT CONTRAST CT CERVICAL SPINE WITHOUT CONTRAST TECHNIQUE: Multidetector CT imaging of the head and cervical spine was performed following the standard protocol without intravenous contrast. Multiplanar CT image reconstructions of the cervical spine were also generated. RADIATION DOSE REDUCTION: This exam was performed according to the departmental dose-optimization program which includes automated exposure control, adjustment of the mA and/or kV according to patient size and/or use of iterative reconstruction technique. COMPARISON:  CT head and CT cervical spine 10/02/2022. FINDINGS: CT HEAD FINDINGS Brain: Similar remote perforator infarcts in the basal ganglia. Similar remote left frontoparietal infarct. No evidence of acute large vascular territory infarct, acute hemorrhage, mass lesion or midline shift. No hydrocephalus. Vascular: Calcific atherosclerosis. No hyperdense vessel identified. Skull: No acute fracture. Sinuses/Orbits: Mostly clear sinuses.  No acute orbital findings. Other: Right middle ear fluid and large right mastoid effusion. CT CERVICAL SPINE FINDINGS Alignment: Reversal  of the normal cervical lordosis. No substantial sagittal subluxation. Skull base and vertebrae: No acute fracture. Soft tissues and spinal canal: No prevertebral fluid or swelling. No visible canal hematoma. Disc levels: Moderate to severe degenerative disease in the mid to lower cervical spine. Upper chest: Visualized lung apices are clear. IMPRESSION: 1. No evidence of acute abnormality intracranially or in the cervical spine. 2. Chronic right mastoid effusion and middle ear fluid. Electronically Signed   By: Gilmore GORMAN Molt M.D.   On: 10/25/2023 14:30   CT Cervical Spine Wo Contrast Result Date: 10/25/2023 CLINICAL DATA:  Head trauma, minor (Age >= 65y); Neck trauma (Age >= 65y) EXAM: CT HEAD WITHOUT CONTRAST CT CERVICAL SPINE WITHOUT CONTRAST TECHNIQUE: Multidetector CT imaging of the head and cervical spine was performed following the standard protocol without intravenous contrast. Multiplanar  CT image reconstructions of the cervical spine were also generated. RADIATION DOSE REDUCTION: This exam was performed according to the departmental dose-optimization program which includes automated exposure control, adjustment of the mA and/or kV according to patient size and/or use of iterative reconstruction technique. COMPARISON:  CT head and CT cervical spine 10/02/2022. FINDINGS: CT HEAD FINDINGS Brain: Similar remote perforator infarcts in the basal ganglia. Similar remote left frontoparietal infarct. No evidence of acute large vascular territory infarct, acute hemorrhage, mass lesion or midline shift. No hydrocephalus. Vascular: Calcific atherosclerosis. No hyperdense vessel identified. Skull: No acute fracture. Sinuses/Orbits: Mostly clear sinuses.  No acute orbital findings. Other: Right middle ear fluid and large right mastoid effusion. CT CERVICAL SPINE FINDINGS Alignment: Reversal of the normal cervical lordosis. No substantial sagittal subluxation. Skull base and vertebrae: No acute fracture. Soft  tissues and spinal canal: No prevertebral fluid or swelling. No visible canal hematoma. Disc levels: Moderate to severe degenerative disease in the mid to lower cervical spine. Upper chest: Visualized lung apices are clear. IMPRESSION: 1. No evidence of acute abnormality intracranially or in the cervical spine. 2. Chronic right mastoid effusion and middle ear fluid. Electronically Signed   By: Gilmore GORMAN Molt M.D.   On: 10/25/2023 14:30   DG Shoulder Right Result Date: 10/25/2023 CLINICAL DATA:  Pain after fall. EXAM: RIGHT SHOULDER - 2+ VIEW COMPARISON:  10/02/2022 FINDINGS: Right shoulder is located. Negative for fracture. Visualized right ribs are intact. Normal alignment at the right [facility] joint with degenerative changes. IMPRESSION: No acute abnormality to the right shoulder. Electronically Signed   By: Juliene Balder M.D.   On: 10/25/2023 13:50   DG Chest 2 View Result Date: 10/25/2023 CLINICAL DATA:  Fall. EXAM: CHEST - 2 VIEW COMPARISON:  Chest CT 09/21/2023 chest radiograph 10/02/2022 FINDINGS: Chronic elevation of left hemidiaphragm. Low lung volumes bilaterally. Difficult to evaluate the cardiac silhouette due to the low lung volumes. Post CABG changes. Negative for pneumothorax. IMPRESSION: Low lung volumes.  No acute findings. Chronic elevation of left hemidiaphragm. Electronically Signed   By: Juliene Balder M.D.   On: 10/25/2023 13:48   DG Knee 2 Views Right Result Date: 10/25/2023 CLINICAL DATA:  Fall. EXAM: RIGHT KNEE - 1-2 VIEW COMPARISON:  None Available. FINDINGS: Two views of the right knee were obtained. Negative for fracture or dislocation. There is probably a moderate sized suprapatellar joint effusion. Spurring at the patellofemoral knee compartment. Surgical clips along the medial aspect of the right knee and proximal calf. IMPRESSION: 1. No acute bone abnormality to the right knee. 2. Suprapatellar joint effusion. 3. Degenerative changes at the patellofemoral knee compartment. Electronically  Signed   By: Juliene Balder M.D.   On: 10/25/2023 13:46    Procedures Procedures    Medications Ordered in ED Medications - No data to display  ED Course/ Medical Decision Making/ A&P Clinical Course as of 10/25/23 1446  Wed Oct 25, 2023  1445 Small effusion of over right knee.  No other traumatic findings on CTs or x-rays.  Laboratory workup unremarkable with renal function slightly elevated from baseline but no AKI.  No leukocytosis or anemia.  EKG shows atrial fibrillation for which he is on Eliquis .  Informed patient and his family at bedside of the results.  Stable for discharge at this time with PCP follow-up [MP]    Clinical Course User Index [MP] Pamella Ozell LABOR, DO  Medical Decision Making 89 year old male with history as above including A-fib on Eliquis  presents after unwitnessed fall.  Tripped over his small dog's leash this morning.  Clemens forward off of the couch.  No head trauma or loss of consciousness.  Now with right knee pain right shoulder pain.  Hemodynamically stable and well-appearing.  Will obtain basic laboratory workup to look for any evidence of electrolyte imbalance anemia or leukocytosis as underlying cause for his fall although he has a good story for mechanical fall.  Will obtain EKG to look for any evidence of dysrhythmia considering his history of atrial fibrillation.  Will evaluate for traumatic findings with CT head, C-spine, chest x-ray, right shoulder x-ray and right knee x-ray.  Patient lives at home with family members  Amount and/or Complexity of Data Reviewed Labs: ordered. Radiology: ordered.           Final Clinical Impression(s) / ED Diagnoses Final diagnoses:  Acute pain of right knee  Fall, initial encounter    Rx / DC Orders ED Discharge Orders     None         Pamella Ozell LABOR, DO 10/25/23 1447

## 2023-10-25 NOTE — Discharge Instructions (Signed)
 Patrick Giles was seen in the emergency department after a fall He does have a small amount of swelling on the right knee but the CAT scan of his head neck and the x-ray of his shoulder and chest looked okay Laboratory workup was consistent with his baseline EKG showed atrial fibrillation which she has a history of and he takes Eliquis  for He is stable for discharge to follow-up with his PCP within the next week Return to the emerged part for repeated falls, severe pain or any other concerns

## 2023-10-25 NOTE — ED Triage Notes (Signed)
 Pt had unwitnessed fall. Takes eliquis  for afib. Only complaint is right knee pain/ swelling. Pt alert, oriented x4. C collar in place. 142/78, 93%, 63 afib, 141 cbg.

## 2023-11-08 NOTE — Progress Notes (Signed)
My response to the following coding query: Please clarify the medical condition(s) necessitating the order for SARS,RSV,Flu A&B   Evaluate for viral illness

## 2024-01-14 ENCOUNTER — Other Ambulatory Visit: Payer: Self-pay

## 2024-01-14 ENCOUNTER — Emergency Department (HOSPITAL_COMMUNITY)

## 2024-01-14 ENCOUNTER — Observation Stay (HOSPITAL_COMMUNITY)
Admission: EM | Admit: 2024-01-14 | Discharge: 2024-01-16 | Disposition: A | Discharge status: Home / Self Care | Attending: Internal Medicine | Admitting: Internal Medicine

## 2024-01-14 DIAGNOSIS — R7989 Other specified abnormal findings of blood chemistry: Secondary | ICD-10-CM | POA: Diagnosis not present

## 2024-01-14 DIAGNOSIS — W19XXXA Unspecified fall, initial encounter: Secondary | ICD-10-CM

## 2024-01-14 DIAGNOSIS — N1832 Chronic kidney disease, stage 3b: Secondary | ICD-10-CM | POA: Diagnosis not present

## 2024-01-14 DIAGNOSIS — I4729 Other ventricular tachycardia: Secondary | ICD-10-CM

## 2024-01-14 DIAGNOSIS — R4701 Aphasia: Secondary | ICD-10-CM | POA: Diagnosis not present

## 2024-01-14 DIAGNOSIS — Z7901 Long term (current) use of anticoagulants: Secondary | ICD-10-CM | POA: Diagnosis present

## 2024-01-14 DIAGNOSIS — I2510 Atherosclerotic heart disease of native coronary artery without angina pectoris: Secondary | ICD-10-CM | POA: Diagnosis not present

## 2024-01-14 DIAGNOSIS — N183 Chronic kidney disease, stage 3 unspecified: Secondary | ICD-10-CM | POA: Diagnosis present

## 2024-01-14 DIAGNOSIS — I5022 Chronic systolic (congestive) heart failure: Secondary | ICD-10-CM | POA: Insufficient documentation

## 2024-01-14 DIAGNOSIS — I4811 Longstanding persistent atrial fibrillation: Secondary | ICD-10-CM | POA: Diagnosis not present

## 2024-01-14 DIAGNOSIS — Z86718 Personal history of other venous thrombosis and embolism: Secondary | ICD-10-CM | POA: Diagnosis not present

## 2024-01-14 DIAGNOSIS — M6281 Muscle weakness (generalized): Secondary | ICD-10-CM | POA: Diagnosis not present

## 2024-01-14 DIAGNOSIS — Z8673 Personal history of transient ischemic attack (TIA), and cerebral infarction without residual deficits: Secondary | ICD-10-CM | POA: Insufficient documentation

## 2024-01-14 DIAGNOSIS — I2583 Coronary atherosclerosis due to lipid rich plaque: Secondary | ICD-10-CM

## 2024-01-14 DIAGNOSIS — I82409 Acute embolism and thrombosis of unspecified deep veins of unspecified lower extremity: Secondary | ICD-10-CM | POA: Diagnosis present

## 2024-01-14 DIAGNOSIS — R55 Syncope and collapse: Principal | ICD-10-CM | POA: Insufficient documentation

## 2024-01-14 DIAGNOSIS — Z87891 Personal history of nicotine dependence: Secondary | ICD-10-CM | POA: Diagnosis not present

## 2024-01-14 DIAGNOSIS — Y92009 Unspecified place in unspecified non-institutional (private) residence as the place of occurrence of the external cause: Principal | ICD-10-CM

## 2024-01-14 DIAGNOSIS — I255 Ischemic cardiomyopathy: Secondary | ICD-10-CM | POA: Diagnosis not present

## 2024-01-14 DIAGNOSIS — I129 Hypertensive chronic kidney disease with stage 1 through stage 4 chronic kidney disease, or unspecified chronic kidney disease: Secondary | ICD-10-CM | POA: Insufficient documentation

## 2024-01-14 DIAGNOSIS — Z23 Encounter for immunization: Secondary | ICD-10-CM | POA: Insufficient documentation

## 2024-01-14 DIAGNOSIS — I482 Chronic atrial fibrillation, unspecified: Secondary | ICD-10-CM | POA: Insufficient documentation

## 2024-01-14 DIAGNOSIS — I130 Hypertensive heart and chronic kidney disease with heart failure and stage 1 through stage 4 chronic kidney disease, or unspecified chronic kidney disease: Secondary | ICD-10-CM | POA: Insufficient documentation

## 2024-01-14 DIAGNOSIS — I10 Essential (primary) hypertension: Secondary | ICD-10-CM | POA: Diagnosis present

## 2024-01-14 DIAGNOSIS — I472 Ventricular tachycardia, unspecified: Secondary | ICD-10-CM | POA: Diagnosis not present

## 2024-01-14 DIAGNOSIS — I4891 Unspecified atrial fibrillation: Secondary | ICD-10-CM | POA: Diagnosis present

## 2024-01-14 DIAGNOSIS — E785 Hyperlipidemia, unspecified: Secondary | ICD-10-CM | POA: Insufficient documentation

## 2024-01-14 DIAGNOSIS — Z79899 Other long term (current) drug therapy: Secondary | ICD-10-CM | POA: Diagnosis not present

## 2024-01-14 HISTORY — DX: Other ventricular tachycardia: I47.29

## 2024-01-14 LAB — URINALYSIS, ROUTINE W REFLEX MICROSCOPIC
Bacteria, UA: NONE SEEN
Bilirubin Urine: NEGATIVE
Glucose, UA: NEGATIVE mg/dL
Hgb urine dipstick: NEGATIVE
Ketones, ur: NEGATIVE mg/dL
Leukocytes,Ua: NEGATIVE
Nitrite: NEGATIVE
Protein, ur: 30 mg/dL — AB
Specific Gravity, Urine: 1.011 (ref 1.005–1.030)
pH: 6 (ref 5.0–8.0)

## 2024-01-14 LAB — CBC
HCT: 46.1 % (ref 39.0–52.0)
Hemoglobin: 14.9 g/dL (ref 13.0–17.0)
MCH: 31 pg (ref 26.0–34.0)
MCHC: 32.3 g/dL (ref 30.0–36.0)
MCV: 95.8 fL (ref 80.0–100.0)
Platelets: 155 10*3/uL (ref 150–400)
RBC: 4.81 MIL/uL (ref 4.22–5.81)
RDW: 12.9 % (ref 11.5–15.5)
WBC: 8.8 10*3/uL (ref 4.0–10.5)
nRBC: 0 % (ref 0.0–0.2)

## 2024-01-14 LAB — COMPREHENSIVE METABOLIC PANEL WITH GFR
ALT: 17 U/L (ref 0–44)
AST: 22 U/L (ref 15–41)
Albumin: 4 g/dL (ref 3.5–5.0)
Alkaline Phosphatase: 59 U/L (ref 38–126)
Anion gap: 11 (ref 5–15)
BUN: 19 mg/dL (ref 8–23)
CO2: 23 mmol/L (ref 22–32)
Calcium: 9.7 mg/dL (ref 8.9–10.3)
Chloride: 104 mmol/L (ref 98–111)
Creatinine, Ser: 1.85 mg/dL — ABNORMAL HIGH (ref 0.61–1.24)
GFR, Estimated: 34 mL/min — ABNORMAL LOW (ref >60)
Glucose, Bld: 121 mg/dL — ABNORMAL HIGH (ref 70–99)
Potassium: 4 mmol/L (ref 3.5–5.1)
Sodium: 138 mmol/L (ref 135–145)
Total Bilirubin: 0.8 mg/dL (ref 0.0–1.2)
Total Protein: 7 g/dL (ref 6.5–8.1)

## 2024-01-14 LAB — TROPONIN I (HIGH SENSITIVITY)
Troponin I (High Sensitivity): 46 ng/L — ABNORMAL HIGH (ref <18)
Troponin I (High Sensitivity): 92 ng/L — ABNORMAL HIGH (ref <18)
Troponin I (High Sensitivity): 123 ng/L (ref <18)

## 2024-01-14 LAB — SAMPLE TO BLOOD BANK

## 2024-01-14 LAB — I-STAT CG4 LACTIC ACID, ED: Lactic Acid, Venous: 1.3 mmol/L (ref 0.5–1.9)

## 2024-01-14 LAB — PROTIME-INR
INR: 1 (ref 0.8–1.2)
Prothrombin Time: 13.8 s (ref 11.4–15.2)

## 2024-01-14 LAB — I-STAT CHEM 8, ED
BUN: 23 mg/dL (ref 8–23)
Calcium, Ion: 1.17 mmol/L (ref 1.15–1.40)
Chloride: 105 mmol/L (ref 98–111)
Creatinine, Ser: 2 mg/dL — ABNORMAL HIGH (ref 0.61–1.24)
Glucose, Bld: 116 mg/dL — ABNORMAL HIGH (ref 70–99)
HCT: 46 % (ref 39.0–52.0)
Hemoglobin: 15.6 g/dL (ref 13.0–17.0)
Potassium: 4 mmol/L (ref 3.5–5.1)
Sodium: 141 mmol/L (ref 135–145)
TCO2: 26 mmol/L (ref 22–32)

## 2024-01-14 LAB — ETHANOL: Alcohol, Ethyl (B): <15 mg/dL (ref <15)

## 2024-01-14 MED ORDER — IOHEXOL 350 MG/ML SOLN
75.0000 mL | Freq: Once | INTRAVENOUS | Status: AC | PRN
  Administered 2024-01-14: 75 mL via INTRAVENOUS

## 2024-01-14 MED ORDER — ATORVASTATIN CALCIUM 40 MG PO TABS
40.0000 mg | ORAL_TABLET | Freq: Every day | ORAL | Status: DC
  Administered 2024-01-14 – 2024-01-15 (×2): 40 mg via ORAL
  Filled 2024-01-14 (×2): qty 1

## 2024-01-14 MED ORDER — LIDOCAINE HCL (PF) 1 % IJ SOLN
5.0000 mL | Freq: Once | INTRAMUSCULAR | Status: AC
  Administered 2024-01-14: 5 mL
  Filled 2024-01-14: qty 5

## 2024-01-14 MED ORDER — APIXABAN 2.5 MG PO TABS
2.5000 mg | ORAL_TABLET | Freq: Two times a day (BID) | ORAL | Status: DC
  Administered 2024-01-14 – 2024-01-16 (×4): 2.5 mg via ORAL
  Filled 2024-01-14 (×4): qty 1

## 2024-01-14 MED ORDER — TETANUS-DIPHTH-ACELL PERTUSSIS 5-2.5-18.5 LF-MCG/0.5 IM SUSY
0.5000 mL | PREFILLED_SYRINGE | Freq: Once | INTRAMUSCULAR | Status: AC
  Administered 2024-01-14: 0.5 mL via INTRAMUSCULAR
  Filled 2024-01-14: qty 0.5

## 2024-01-14 MED ORDER — METOPROLOL SUCCINATE ER 25 MG PO TB24
25.0000 mg | ORAL_TABLET | Freq: Every day | ORAL | Status: DC
  Administered 2024-01-14 – 2024-01-15 (×2): 25 mg via ORAL
  Filled 2024-01-14 (×2): qty 1

## 2024-01-14 MED ORDER — SODIUM CHLORIDE 0.9 % IV BOLUS
1000.0000 mL | Freq: Once | INTRAVENOUS | Status: AC
  Administered 2024-01-14: 1000 mL via INTRAVENOUS

## 2024-01-14 NOTE — Assessment & Plan Note (Signed)
Continue Lipitor 40 mg a day

## 2024-01-14 NOTE — ED Provider Notes (Signed)
 [facility] 3E HF PCU Provider Note  CSN: [id number] Arrival date & time: 01/14/24 1706  Chief Complaint(s) Fall  HPI Patrick Giles is a 89 y.o. male who presents today as a level 2 trauma activation.  Patient takes Eliquis , and had a fall.  He is alert and oriented x 3, is unsure what happened prior to his fall.  The patient says that lasting that he can remember he was on the ground with his walker on the ground beside him.  Patient lives at home, has a daughter who checks on him.  States that he been feeling well, but does not recall falling or how he ended up on the ground.   Past Medical History Past Medical History:  Diagnosis Date   CAD (coronary artery disease)    DVT, lower extremity (HCC) 07/23/2014   Dyslipidemia    Hypertension    NSVT (nonsustained ventricular tachycardia) (HCC) 01/14/2024   S/P CABG (coronary artery bypass graft) 1998   LIMA to LAD, VG to PDA, VG to first diagonal   Stroke [facility])    Patient Active Problem List   Diagnosis Date Noted   Fall at home, initial encounter 01/14/2024   Elevated troponin 01/14/2024   History of CVA (cerebrovascular accident) 01/14/2024   Diverticulosis of colon 10/03/2022   Chronic a-fib (HCC) 10/03/2022   Thoracic aortic aneurysm without rupture (HCC) 01/18/2018   Ascending aorta dilatation (HCC) 01/18/2017   Essential hypertension 07/24/2014   History of DVT (deep vein thrombosis) 07/24/2014   CAD (coronary artery disease) 11/18/2013   S/P CABG x 3 11/18/2013   Cardiomyopathy, ischemic 11/18/2013   Dyslipidemia 11/18/2013   CKD stage 3b, GFR 30-44 ml/min (HCC) 11/18/2013   Impaired fasting glucose 11/18/2013   Home Medication(s) Prior to Admission medications   Medication Sig Start Date End Date Taking? Authorizing Provider  apixaban  (ELIQUIS ) 2.5 MG TABS tablet TAKE 1 TABLET BY MOUTH TWICE A DAY Patient taking differently: Take 2.5 mg by mouth 2 (two) times daily. 01/06/22  Yes Hilty, Aviva Lemmings, MD   atorvastatin  (LIPITOR ) 40 MG tablet TAKE 1 TABLET BY MOUTH EVERY DAY AT 6PM Patient taking differently: Take 40 mg by mouth at bedtime. 02/19/18  Yes Hilty, Aviva Lemmings, MD  Garlic 1000 MG CAPS Take 1,000 mg by mouth daily.   Yes [provider]  metoprolol  succinate (TOPROL -XL) 25 MG 24 hr tablet Take 25 mg by mouth at bedtime.   Yes [provider]  Multiple Vitamins-Minerals (ALIVE MENS ENERGY PO) Take 1 tablet by mouth daily with breakfast.   Yes [provider]  Omega-3 Fatty Acids (FISH OIL) 1000 MG CAPS Take 1,000 mg by mouth in the morning.   Yes [provider]  melatonin 10 MG TABS Take 10 mg by mouth at bedtime. Patient not taking: Reported on 01/22/2024 01/16/24 02/15/24  Unk Garb, DO  Past Surgical History Past Surgical History:  Procedure Laterality Date   CHOLECYSTECTOMY  1998   CORONARY ARTERY BYPASS GRAFT  07/08/1997   LIMA to LAD, reverse SVG to PDA, reverse SVG to first diagonal (Dr. Mardell Shade)   TRANSTHORACIC ECHOCARDIOGRAM  11/29/2012   EF 45-50%, grade 1 diastolic dysfunction   Family History Family History  Problem Relation Age of Onset   Emphysema Mother    Colon cancer Father    Colon polyps Father    Lung cancer Brother    Diabetes Neg Hx    Kidney disease Neg Hx    Esophageal cancer Neg Hx     Social History Social History   Tobacco Use   Smoking status: Former    Types: Cigars    Quit date: 09/19/1988    Years since quitting: 35.3   Smokeless tobacco: Never  Vaping Use   Vaping status: Never Used  Substance Use Topics   Alcohol use: No    Alcohol/week: 0.0 standard drinks of alcohol   Drug use: No   Allergies Patient has no known allergies.  Review of Systems Review of Systems  Physical Exam Vital Signs  I have reviewed the triage vital signs BP 139/67 (BP Location: Right Arm)    Pulse 62   Temp 97.6 F (36.4 C) (Oral)   Resp 18   Ht 5\' 3"  (1.6 m)   Wt (P) 89.4 kg   SpO2 97%   BMI (P) 34.91 kg/m   Physical Exam Vitals and nursing note reviewed.  HENT:     Head: Normocephalic.     Comments: Small hematoma to the right occiput    Mouth/Throat:     Mouth: Mucous membranes are dry.  Eyes:     Pupils: Pupils are equal, round, and reactive to light.  Neck:     Comments: Cervical collar in place Cardiovascular:     Rate and Rhythm: Normal rate.     Pulses: Normal pulses.  Pulmonary:     Effort: Pulmonary effort is normal.  Abdominal:     General: Abdomen is flat. There is distension.  Musculoskeletal:     Comments: No tenderness to palpation in the bilateral shoulders, upper arms, elbows, forearms or wrists.  No tenderness to palpation in the chest.  Pelvis stable, nontender.  No tenderness, deformities noted on bilateral upper legs, knees, lower legs or ankles.  Patient able to lift both legs from the bed.  Neurological:     General: No focal deficit present.     Mental Status: He is alert and oriented to person, place, and time.     ED Results and Treatments Labs (all labs ordered are listed, but only abnormal results are displayed) Labs Reviewed  COMPREHENSIVE METABOLIC PANEL WITH GFR - Abnormal; Notable for the following components:      Result Value   Glucose, Bld 121 (*)    Creatinine, Ser 1.85 (*)    GFR, Estimated 34 (*)    All other components within normal limits  URINALYSIS, ROUTINE W REFLEX MICROSCOPIC - Abnormal; Notable for the following components:   Protein, ur 30 (*)    All other components within normal limits  COMPREHENSIVE METABOLIC PANEL WITH GFR - Abnormal; Notable for the following components:   Glucose, Bld 125 (*)    Creatinine, Ser 1.85 (*)    Total Protein 5.8 (*)    Albumin 3.1 (*)    GFR, Estimated 34 (*)    All other components within normal limits  CBC - Abnormal; Notable for the following components:   RBC  4.06 (*)    Hemoglobin 12.8 (*)    HCT 38.2 (*)    Platelets 142 (*)    All other components within normal limits  I-STAT CHEM 8, ED - Abnormal; Notable for the following components:   Creatinine, Ser 2.00 (*)    Glucose, Bld 116 (*)    All other components within normal limits  TROPONIN I (HIGH SENSITIVITY) - Abnormal; Notable for the following components:   Troponin I (High Sensitivity) 46 (*)    All other components within normal limits  TROPONIN I (HIGH SENSITIVITY) - Abnormal; Notable for the following components:   Troponin I (High Sensitivity) 92 (*)    All other components within normal limits  TROPONIN I (HIGH SENSITIVITY) - Abnormal; Notable for the following components:   Troponin I (High Sensitivity) 123 (*)    All other components within normal limits  TROPONIN I (HIGH SENSITIVITY) - Abnormal; Notable for the following components:   Troponin I (High Sensitivity) 142 (*)    All other components within normal limits  TROPONIN I (HIGH SENSITIVITY) - Abnormal; Notable for the following components:   Troponin I (High Sensitivity) 141 (*)    All other components within normal limits  TROPONIN I (HIGH SENSITIVITY) - Abnormal; Notable for the following components:   Troponin I (High Sensitivity) 172 (*)    All other components within normal limits  CBC  ETHANOL  PROTIME-INR  MAGNESIUM   PHOSPHORUS  I-STAT CG4 LACTIC ACID, ED  SAMPLE TO BLOOD BANK                                                                                                                          Radiology No results found.  Pertinent labs & imaging results that were available during my care of the patient were reviewed by me and considered in my medical decision making (see MDM for details).  Medications Ordered in ED Medications  perflutren  lipid microspheres (DEFINITY ) IV suspension (4 mLs Intravenous Given 01/15/24 1625)  sodium chloride  0.9 % bolus 1,000 mL (0 mLs Intravenous Stopped 01/14/24  1949)  iohexol  (OMNIPAQUE ) 350 MG/ML injection 75 mL (75 mLs Intravenous Contrast Given 01/14/24 1832)  Tdap (BOOSTRIX ) injection 0.5 mL (0.5 mLs Intramuscular Given 01/14/24 2033)  lidocaine  (PF) (XYLOCAINE ) 1 % injection 5 mL (5 mLs Other Given by Other 01/14/24 2015)  magnesium  sulfate IVPB 2 g 50 mL (0 g Intravenous Stopped 01/15/24 1539)  Procedures .Critical Care  Performed by: Nathanael Baker, DO Authorized by: Nathanael Baker, DO   Critical care provider statement:    Critical care time (minutes):  88   Critical care was time spent personally by me on the following activities:  Development of treatment plan with patient or surrogate, discussions with consultants, evaluation of patient's response to treatment, examination of patient, ordering and review of laboratory studies, ordering and review of radiographic studies, ordering and performing treatments and interventions, pulse oximetry, re-evaluation of patient's condition and review of old charts   (including critical care time)  Medical Decision Making / ED Course   This patient presents to the ED for concern of fall, fall on thinners, this involves an extensive number of treatment options, and is a complaint that carries with it a high risk of complications and morbidity.  The differential diagnosis includes syncope, traumatic injury, intracranial hemorrhage, dehydration, anemia, hypertension.  MDM: Unclear cause of patient's fall.  This may have been mechanical, may not have been.  Will do an EKG on the patient, blood work.  My suspicion is that this is more likely a medical workup rather than a true trauma workup.  With the patient's anticoagulated status, will obtain imaging of the patient's head and neck.  Patient has some mild abdominal distention but no discomfort.  Given that patient is unable  to tell us  what happened, will obtain imaging of the patient's abdomen and pelvis.  Patient is however alert and oriented x 3.  Pleasant, conversant.  Reassessment 8:30 PM-patient's imaging negative.  Blood work overall at the patient's baseline.  Patient is here with his daughters who help provide additional history.  There is still no clear cause for the patient's fall, thus we treated his syncope.  Will admit patient to medical service.   Additional history obtained: -Additional history obtained from family at bedside -External records from outside source obtained and reviewed including: Chart review including previous notes, labs, imaging, consultation notes   Lab Tests: -I ordered, reviewed, and interpreted labs.   The pertinent results include:   Labs Reviewed  COMPREHENSIVE METABOLIC PANEL WITH GFR - Abnormal; Notable for the following components:      Result Value   Glucose, Bld 121 (*)    Creatinine, Ser 1.85 (*)    GFR, Estimated 34 (*)    All other components within normal limits  URINALYSIS, ROUTINE W REFLEX MICROSCOPIC - Abnormal; Notable for the following components:   Protein, ur 30 (*)    All other components within normal limits  COMPREHENSIVE METABOLIC PANEL WITH GFR - Abnormal; Notable for the following components:   Glucose, Bld 125 (*)    Creatinine, Ser 1.85 (*)    Total Protein 5.8 (*)    Albumin 3.1 (*)    GFR, Estimated 34 (*)    All other components within normal limits  CBC - Abnormal; Notable for the following components:   RBC 4.06 (*)    Hemoglobin 12.8 (*)    HCT 38.2 (*)    Platelets 142 (*)    All other components within normal limits  I-STAT CHEM 8, ED - Abnormal; Notable for the following components:   Creatinine, Ser 2.00 (*)    Glucose, Bld 116 (*)    All other components within normal limits  TROPONIN I (HIGH SENSITIVITY) - Abnormal; Notable for the following components:   Troponin I (High Sensitivity) 46 (*)    All other components  within normal limits  TROPONIN  I (HIGH SENSITIVITY) - Abnormal; Notable for the following components:   Troponin I (High Sensitivity) 92 (*)    All other components within normal limits  TROPONIN I (HIGH SENSITIVITY) - Abnormal; Notable for the following components:   Troponin I (High Sensitivity) 123 (*)    All other components within normal limits  TROPONIN I (HIGH SENSITIVITY) - Abnormal; Notable for the following components:   Troponin I (High Sensitivity) 142 (*)    All other components within normal limits  TROPONIN I (HIGH SENSITIVITY) - Abnormal; Notable for the following components:   Troponin I (High Sensitivity) 141 (*)    All other components within normal limits  TROPONIN I (HIGH SENSITIVITY) - Abnormal; Notable for the following components:   Troponin I (High Sensitivity) 172 (*)    All other components within normal limits  CBC  ETHANOL  PROTIME-INR  MAGNESIUM   PHOSPHORUS  I-STAT CG4 LACTIC ACID, ED  SAMPLE TO BLOOD BANK      EKG   EKG Interpretation Date/Time:  Sunday January 14 2024 17:13:44 EDT Ventricular Rate:  92 PR Interval:    QRS Duration:  133 QT Interval:  400 QTC Calculation: 495 R Axis:   83  Text Interpretation: Atrial flutter Right bundle branch block Confirmed by Afton Horse [phone]) on 01/14/2024 8:09:33 PM         Imaging Studies ordered: I ordered imaging studies including CT imaging, plain films I independently visualized and interpreted imaging. I agree with the radiologist interpretation   Medicines ordered and prescription drug management: Meds ordered this encounter  Medications   sodium chloride  0.9 % bolus 1,000 mL   iohexol  (OMNIPAQUE ) 350 MG/ML injection 75 mL   Tdap (BOOSTRIX ) injection 0.5 mL   lidocaine  (PF) (XYLOCAINE ) 1 % injection 5 mL   DISCONTD: apixaban  (ELIQUIS ) tablet 2.5 mg   DISCONTD: atorvastatin  (LIPITOR ) tablet 40 mg   DISCONTD: amLODipine  (NORVASC ) tablet 5 mg   DISCONTD: metoprolol  succinate  (TOPROL -XL) 24 hr tablet 25 mg   DISCONTD: 0.9 %  sodium chloride  infusion   DISCONTD: acetaminophen  (TYLENOL ) tablet 650 mg   DISCONTD: acetaminophen  (TYLENOL ) suppository 650 mg   DISCONTD: HYDROcodone -acetaminophen  (NORCO/VICODIN) 5-325 MG per tablet 1-2 tablet   DISCONTD: ondansetron  (ZOFRAN ) tablet 4 mg   DISCONTD: ondansetron  (ZOFRAN ) injection 4 mg   magnesium  sulfate IVPB 2 g 50 mL   DISCONTD: melatonin tablet 10 mg   DISCONTD: acetaminophen  (TYLENOL ) tablet 1,000 mg   perflutren  lipid microspheres (DEFINITY ) IV suspension   HYDROcodone -acetaminophen  (NORCO/VICODIN) 5-325 MG tablet    Sig: Take 1 tablet by mouth every 6 (six) hours as needed for up to 5 days for moderate pain (pain score 4-6).    Dispense:  20 tablet    Refill:  0   melatonin 10 MG TABS    Sig: Take 10 mg by mouth at bedtime.    -I have reviewed the patients home medicines and have made adjustments as needed  Critical interventions Management of trauma   Cardiac Monitoring: The patient was maintained on a cardiac monitor.  I personally viewed and interpreted the cardiac monitored which showed an underlying rhythm of: Normal sinus rhythm  Social Determinants of Health: Factors impacting patients care include:    Reevaluation: After the interventions noted above, I reevaluated the patient and found that they have :improved  Co morbidities that complicate the patient evaluation  Past Medical History:  Diagnosis Date   CAD (coronary artery disease)    DVT, lower extremity (HCC) 07/23/2014  Dyslipidemia    Hypertension    NSVT (nonsustained ventricular tachycardia) (HCC) 01/14/2024   S/P CABG (coronary artery bypass graft) 1998   LIMA to LAD, VG to PDA, VG to first diagonal   Stroke [facility])          Final Clinical Impression(s) / ED Diagnoses Final diagnoses:  Fall, initial encounter     @PCDICTATION @    Afton Horse T, DO 01/23/24 [phone]

## 2024-01-14 NOTE — Assessment & Plan Note (Signed)
-  chronic avoid nephrotoxic medications such as NSAIDs, Vanco Zosyn combo,  avoid hypotension, continue to follow renal function

## 2024-01-14 NOTE — Assessment & Plan Note (Addendum)
 Questionable episode patient unable to provide detailed history but seems like he was walking and then he found himself on the floor Obtain echogram monitor on telemetry Troponin slightly elevated continue to trend if continues to go up may need cardiology consult

## 2024-01-14 NOTE — Progress Notes (Signed)
   01/14/24 1710  Spiritual Encounters  Referral source Trauma page  Reason for visit Trauma  OnCall Visit Yes    Chaplain will continue to monitor and remains available.

## 2024-01-14 NOTE — Assessment & Plan Note (Signed)
 Patient is on Eliquis  continue Lipitor  40 mg daily

## 2024-01-14 NOTE — Progress Notes (Signed)
 Orthopedic Tech Progress Note Patient Details:  Patrick Giles [birth year] [id number] Responded to level 2 trauma, not needed at this time.  Patient ID: [name], male   DOB: [birth year], 89 y.o.   MRN: [id number]  Nels Banda 01/14/2024, 5:14 PM

## 2024-01-14 NOTE — ED Notes (Signed)
 Daughter at bedside.

## 2024-01-14 NOTE — Assessment & Plan Note (Signed)
 Chronic stable resume Eliquis  2.5 mg p.o. twice daily And continue Toprol  25 mg daily

## 2024-01-14 NOTE — Assessment & Plan Note (Signed)
 Chronic stable continue Toprol  25 mg daily continue Lipitor  40 mg daily

## 2024-01-14 NOTE — Assessment & Plan Note (Signed)
Continue eliquis 2.5 mg po bid

## 2024-01-14 NOTE — Assessment & Plan Note (Signed)
 Resume Norvasc  5 mg daily and Toprol  25 mg daily

## 2024-01-14 NOTE — ED Triage Notes (Signed)
 PT BIB [facility] - unwitnessed fall at home, pos LOC. Blood thinner - Eliquis . Hematoma right side of head per EMS. Alert and oriented x4. GCS 15.   20G RH  135 CBG  Hx stroke - right leg weakness and baseline aphasia

## 2024-01-14 NOTE — H&P (Signed)
 Patrick Giles ZOX:[id number] DOB: [birth year] DOA: 01/14/2024     PCP: Emaline Handsome, MD   Outpatient Specialists:   CARDS:   Dr. Hazle Lites, MD    Patient arrived to ER on 01/14/24 at 1706 Referred by Attending Nathanael Baker, DO   Patient coming from:    home Lives alone,      Chief Complaint:   Chief Complaint  Patient presents with   Fall    HPI: Patrick Giles is a 89 y.o. male with medical history significant of HLD CKD A-fib on Eliquis  thrombocytopenia hypertension    Presented with     Patient had an unwitnessed fall at home he is on Eliquis  for history of A-fib.  Found to have hematoma of right side of her head per EMS. At baseline has right-sided weakness and aphasia. Patient unsure how he fell. He was walking with a walker when this happened.  He lives alone at home but daughter checks on him frequently.   Patient has been admitted in January for ileus versus small bowel obstruction was managed nonoperatively and patient was able to be discharged home  He was siting watching TV, family thinks he got up was trying to get a remote and the next thing he was on the floor  No chest pain , no shortness of breath  On a good day he is able to walk his dog    Denies significant ETOH intake   Does not smoke       Regarding pertinent Chronic problems:    Hyperlipidemia -  on statins Lipitor  (atorvastatin )  Lipid Panel     Component Value Date/Time   CHOL 135 06/19/2018 0437   CHOL 147 01/18/2018 0932   TRIG 115 06/19/2018 0437   HDL 44 06/19/2018 0437   HDL 54 01/18/2018 0932   CHOLHDL 3.1 06/19/2018 0437   VLDL 23 06/19/2018 0437   LDLCALC 68 06/19/2018 0437   LDLCALC 63 01/18/2018 0932   LABVLDL 30 01/18/2018 0932     HTN on Toprol    chronic CHF  systolic  -  EF of 40-45% on Echo in 2017 but gross normal on Echo in 2019,  He has not been on ACEi/ ARB/ ARNI, MRA, or SGLT2 inhibitor. Due to advanced age, improved EF, and renal function.      CAD  - On Aspirin , statin, betablocker                  - followed by cardiology                CAD s/p remote CABG x3 (LIMA-LAD, SVG-Diag, SVG-PDA         Hx of CVA 2019 - *with/out residual deficits on Aspirin  81 mg, 325, Plavix    A. Fib -   atrial fibrillation CHA2DS2 vas score   6     current  on anticoagulation with  Eliquis ,          -  Rate control:  Currently controlled with  Toprolol,           ascending thoracic aortic aneurysm (stable at 4.3 cm on chest CT in 09/2022)    CKD stage IIIb baseline Cr 2.0 Estimated Creatinine Clearance: 30 mL/min (A) (by C-G formula based on SCr of 2 mg/dL (H)).  Lab Results  Component Value Date   CREATININE 2.00 (H) 01/14/2024   CREATININE 1.85 (H) 01/14/2024   CREATININE 2.10 (H) 10/25/2023   Lab Results  Component Value Date   NA 141 01/14/2024   CL 105 01/14/2024   K 4.0 01/14/2024   CO2 23 01/14/2024   BUN 23 01/14/2024   CREATININE 2.00 (H) 01/14/2024   GFRNONAA 34 (L) 01/14/2024   CALCIUM  9.7 01/14/2024   ALBUMIN 4.0 01/14/2024   GLUCOSE 116 (H) 01/14/2024      While in ER:     Concerning for possible syncope troponin slightly elevated.  EKG nonischemic    Lab Orders         Comprehensive metabolic panel         CBC         Ethanol         Urinalysis, Routine w reflex microscopic -Urine, Clean Catch         Protime-INR         I-Stat Chem 8, ED         I-Stat Lactic Acid, ED      CT HEAD/neck  NON acute     CXR -  NON acute  CTabd/pelvis -  non-acute    Following Medications were ordered in ER: Medications  lidocaine (PF) (XYLOCAINE) 1 % injection 5 mL (has no administration in time range)  sodium chloride  0.9 % bolus 1,000 mL (0 mLs Intravenous Stopped 01/14/24 1949)  iohexol (OMNIPAQUE) 350 MG/ML injection 75 mL (75 mLs Intravenous Contrast Given 01/14/24 1832)  Tdap (BOOSTRIX) injection 0.5 mL (0.5 mLs Intramuscular Given 01/14/24 2033)        ED Triage Vitals  Encounter Vitals Group     BP  01/14/24 1709 (S) (!) 180/90     Systolic BP Percentile --      Diastolic BP Percentile --      Pulse Rate 01/14/24 1715 91     Resp 01/14/24 1715 (!) 23     Temp 01/14/24 1707 98.1 F (36.7 C)     Temp Source 01/14/24 1707 Temporal     SpO2 01/14/24 1715 99 %     Weight 01/14/24 1713 210 lb (95.3 kg)     Height 01/14/24 1713 6' (1.829 m)     Head Circumference --      Peak Flow --      Pain Score 01/14/24 1713 8     Pain Loc --      Pain Education --      Exclude from Growth Chart --   UUVO(53)@     _________________________________________ Significant initial  Findings: Abnormal Labs Reviewed  COMPREHENSIVE METABOLIC PANEL WITH GFR - Abnormal; Notable for the following components:      Result Value   Glucose, Bld 121 (*)    Creatinine, Ser 1.85 (*)    GFR, Estimated 34 (*)    All other components within normal limits  URINALYSIS, ROUTINE W REFLEX MICROSCOPIC - Abnormal; Notable for the following components:   Protein, ur 30 (*)    All other components within normal limits  I-STAT CHEM 8, ED - Abnormal; Notable for the following components:   Creatinine, Ser 2.00 (*)    Glucose, Bld 116 (*)    All other components within normal limits  TROPONIN I (HIGH SENSITIVITY) - Abnormal; Notable for the following components:   Troponin I (High Sensitivity) 46 (*)    All other components within normal limits  TROPONIN I (HIGH SENSITIVITY) - Abnormal; Notable for the following components:   Troponin I (High Sensitivity) 92 (*)    All other components within normal limits  _________________________ Troponin  ordered Cardiac Panel (last 3 results) Recent Labs    01/14/24 1712 01/14/24 2037  TROPONINIHS 46* 92*     ECG: Ordered Personally reviewed and interpreted by me showing: HR : 92 Rhythm:Atrial flutter Right bundle branch block QTC 495    The recent clinical data is shown below. Vitals:   01/14/24 1745 01/14/24 1800 01/14/24 1815 01/14/24 2100  BP: (!) 175/106  (!) 180/90 (!) 176/91 (!) 145/100  Pulse: 93 86 89 99  Resp: 19 19 (!) 23 (!) 22  Temp:      TempSrc:      SpO2: 99% 96% 97% 100%  Weight:      Height:          WBC     Component Value Date/Time   WBC 8.8 01/14/2024 1712   LYMPHSABS 1.6 10/25/2023 1253   MONOABS 1.2 (H) 10/25/2023 1253   EOSABS 0.0 10/25/2023 1253   BASOSABS 0.0 10/25/2023 1253     Lactic Acid, Venous    Component Value Date/Time   LATICACIDVEN 1.3 01/14/2024 1723       UA   no evidence of UTI     Urine analysis:    Component Value Date/Time   COLORURINE YELLOW 01/14/2024 1829   APPEARANCEUR CLEAR 01/14/2024 1829   LABSPEC 1.011 01/14/2024 1829   PHURINE 6.0 01/14/2024 1829   GLUCOSEU NEGATIVE 01/14/2024 1829   HGBUR NEGATIVE 01/14/2024 1829   BILIRUBINUR NEGATIVE 01/14/2024 1829   BILIRUBINUR neg 08/01/2014 0905   KETONESUR NEGATIVE 01/14/2024 1829   PROTEINUR 30 (A) 01/14/2024 1829   UROBILINOGEN 0.2 08/01/2014 0905   UROBILINOGEN 0.2 07/24/2014 0640   NITRITE NEGATIVE 01/14/2024 1829   LEUKOCYTESUR NEGATIVE 01/14/2024 1829    __________________________________________________________ Recent Labs  Lab 01/14/24 1712 01/14/24 1723  NA 138 141  K 4.0 4.0  CO2 23  --   GLUCOSE 121* 116*  BUN 19 23  CREATININE 1.85* 2.00*  CALCIUM  9.7  --     Cr    stable,  Lab Results  Component Value Date   CREATININE 2.00 (H) 01/14/2024   CREATININE 1.85 (H) 01/14/2024   CREATININE 2.10 (H) 10/25/2023    Recent Labs  Lab 01/14/24 1712  AST 22  ALT 17  ALKPHOS 59  BILITOT 0.8  PROT 7.0  ALBUMIN 4.0   Lab Results  Component Value Date   CALCIUM  9.7 01/14/2024    Plt: Lab Results  Component Value Date   PLT 155 01/14/2024      Recent Labs  Lab 01/14/24 1712 01/14/24 1723  WBC 8.8  --   HGB 14.9 15.6  HCT 46.1 46.0  MCV 95.8  --   PLT 155  --     HG/HCT   stable,       Component Value Date/Time   HGB 15.6 01/14/2024 1723   HCT 46.0 01/14/2024 1723   MCV 95.8  01/14/2024 1712   MCV 85.4 11/22/2015 1345     ___________________________________________ Hospitalist was called for admission for possible syncope and elevated troponin   The following Work up has been ordered so far:  Orders Placed This Encounter  Procedures   Critical Care   DG Chest Port 1 View   CT HEAD WO CONTRAST   CT CERVICAL SPINE WO CONTRAST   CT ABDOMEN PELVIS W CONTRAST   Comprehensive metabolic panel   CBC   Ethanol   Urinalysis, Routine w reflex microscopic -Urine, Clean Catch   Protime-INR   Diet NPO  time specified   ED Cardiac monitoring   Measure blood pressure   Initiate Carrier Fluid Protocol   Consult for [facility] Admission   I-Stat Chem 8, ED   I-Stat Lactic Acid, ED   ED EKG   EKG 12-Lead   Sample to Blood Bank     OTHER Significant initial  Findings:  labs showing:     Cultures:    Component Value Date/Time   SDES BLOOD BLOOD LEFT FOREARM 10/03/2022 0445   SPECREQUEST  10/03/2022 0445    BOTTLES DRAWN AEROBIC AND ANAEROBIC Blood Culture adequate volume   CULT  10/03/2022 0445    NO GROWTH 5 DAYS Performed at [facility], [address], [place], [place] [address]    REPTSTATUS 10/08/2022 FINAL 10/03/2022 0445     Radiological Exams on Admission: CT ABDOMEN PELVIS W CONTRAST Result Date: 01/14/2024 CLINICAL DATA:  Marvell Slider. EXAM: CT ABDOMEN AND PELVIS WITH CONTRAST TECHNIQUE: Multidetector CT imaging of the abdomen and pelvis was performed using the standard protocol following bolus administration of intravenous contrast. RADIATION DOSE REDUCTION: This exam was performed according to the departmental dose-optimization program which includes automated exposure control, adjustment of the mA and/or kV according to patient size and/or use of iterative reconstruction technique. CONTRAST:  75mL OMNIPAQUE IOHEXOL 350 MG/ML SOLN COMPARISON:  CT scan 10/02/2022 FINDINGS: Lower chest: Marked elevation of the left hemidiaphragm. The right  lung is grossly clear except for dependent subpleural atelectasis/edema. No pleural effusion or pneumothorax. The heart is normal in size. Surgical changes from bypass surgery. Aortic and coronary artery calcifications. Stable pleural calcifications on the left. Hepatobiliary: No hepatic injury or perihepatic hematoma. Gallbladder is surgically absent. No common bile duct dilatation. Pancreas: Stable mild pancreatic atrophy but no mass, inflammation or ductal dilatation. No peripancreatic fluid collections. Spleen: No splenic injury or perisplenic hematoma. Small accessory spleen. Adrenals/Urinary Tract: The adrenal glands are normal. Stable numerous bilateral renal cysts not requiring any further imaging evaluation or follow-up. The bladder is unremarkable. Stomach/Bowel: The stomach, duodenum, small bowel and colon are grossly normal without oral contrast. No inflammatory changes, mass lesions or obstructive findings. The appendix is normal. Vascular/Lymphatic: Stable age related atherosclerotic calcifications involving the aorta and branch vessels. No aneurysm or dissection. The major venous structures are patent. No mesenteric or retroperitoneal mass, adenopathy or hematoma. Reproductive: The prostate gland and seminal vesicles are unremarkable. Other: No pelvic mass or adenopathy. No free pelvic fluid collections. No inguinal mass or adenopathy. No abdominal wall hernia or subcutaneous lesions. Musculoskeletal: No acute bony findings. No acute lower rib fractures. Remote healed left rib fractures are noted. The lumbar vertebral bodies are intact. The pubic symphysis and SI joints are intact. No pelvic or hip fractures. IMPRESSION: 1. No acute abdominal/pelvic findings, mass lesions or adenopathy. 2. No acute bony findings. 3. Stable marked elevation of the left hemidiaphragm. 4. Stable vascular disease. 5. Stable bilateral renal cysts. Aortic Atherosclerosis (ICD10-I70.0). Electronically Signed   By: Marrian Siva M.D.   On: 01/14/2024 19:51   CT HEAD WO CONTRAST Result Date: 01/14/2024 CLINICAL DATA:  Marvell Slider.  Hit head. EXAM: CT HEAD WITHOUT CONTRAST CT CERVICAL SPINE WITHOUT CONTRAST TECHNIQUE: Multidetector CT imaging of the head and cervical spine was performed following the standard protocol without intravenous contrast. Multiplanar CT image reconstructions of the cervical spine were also generated. RADIATION DOSE REDUCTION: This exam was performed according to the departmental dose-optimization program which includes automated exposure control, adjustment of the mA and/or kV according to  patient size and/or use of iterative reconstruction technique. COMPARISON:  CT scan 10/25/2023 FINDINGS: CT HEAD FINDINGS Brain: Stable age related cerebral atrophy, ventriculomegaly and periventricular white matter disease. No extra-axial fluid collections are identified. No CT findings for acute hemispheric infarction or intracranial hemorrhage. No mass lesions. The brainstem and cerebellum are normal. Vascular: Stable vascular calcifications.  No hyperdense vessels. Skull: No skull fracture or bone lesions. Sinuses/Orbits: The paranasal sinuses and left mastoid air cells are clear. Stable opacification of the right mastoid air cells and fluid throughout the middle ear cavity. Other: No scalp lesions or scalp hematoma. CT CERVICAL SPINE FINDINGS Alignment: Stable degenerative cervical spondylosis and straightening of the cervical lordosis. Skull base and vertebrae: No acute fracture. No primary bone lesion or focal pathologic process. Soft tissues and spinal canal: No prevertebral fluid or swelling. No visible canal hematoma. Disc levels: The spinal canal is generous. No significant canal stenosis. Upper chest: The lung apices are grossly clear. Other: No cervical adenopathy or hematoma. Stable carotid artery calcifications. IMPRESSION: 1. Stable age related cerebral atrophy, ventriculomegaly and periventricular white  matter disease. 2. No acute intracranial findings or skull fracture. 3. Stable opacification of the right mastoid air cells and fluid throughout the middle ear cavity. 4. No cervical spine fracture or acute findings. Electronically Signed   By: Marrian Siva M.D.   On: 01/14/2024 19:44   CT CERVICAL SPINE WO CONTRAST Result Date: 01/14/2024 CLINICAL DATA:  Marvell Slider.  Hit head. EXAM: CT HEAD WITHOUT CONTRAST CT CERVICAL SPINE WITHOUT CONTRAST TECHNIQUE: Multidetector CT imaging of the head and cervical spine was performed following the standard protocol without intravenous contrast. Multiplanar CT image reconstructions of the cervical spine were also generated. RADIATION DOSE REDUCTION: This exam was performed according to the departmental dose-optimization program which includes automated exposure control, adjustment of the mA and/or kV according to patient size and/or use of iterative reconstruction technique. COMPARISON:  CT scan 10/25/2023 FINDINGS: CT HEAD FINDINGS Brain: Stable age related cerebral atrophy, ventriculomegaly and periventricular white matter disease. No extra-axial fluid collections are identified. No CT findings for acute hemispheric infarction or intracranial hemorrhage. No mass lesions. The brainstem and cerebellum are normal. Vascular: Stable vascular calcifications.  No hyperdense vessels. Skull: No skull fracture or bone lesions. Sinuses/Orbits: The paranasal sinuses and left mastoid air cells are clear. Stable opacification of the right mastoid air cells and fluid throughout the middle ear cavity. Other: No scalp lesions or scalp hematoma. CT CERVICAL SPINE FINDINGS Alignment: Stable degenerative cervical spondylosis and straightening of the cervical lordosis. Skull base and vertebrae: No acute fracture. No primary bone lesion or focal pathologic process. Soft tissues and spinal canal: No prevertebral fluid or swelling. No visible canal hematoma. Disc levels: The spinal canal is generous.  No significant canal stenosis. Upper chest: The lung apices are grossly clear. Other: No cervical adenopathy or hematoma. Stable carotid artery calcifications. IMPRESSION: 1. Stable age related cerebral atrophy, ventriculomegaly and periventricular white matter disease. 2. No acute intracranial findings or skull fracture. 3. Stable opacification of the right mastoid air cells and fluid throughout the middle ear cavity. 4. No cervical spine fracture or acute findings. Electronically Signed   By: Marrian Siva M.D.   On: 01/14/2024 19:44   DG Chest Port 1 View Result Date: 01/14/2024 CLINICAL DATA:  Marvell Slider.  Chest pain. EXAM: PORTABLE CHEST 1 VIEW COMPARISON:  Chest x-ray 10/25/2023 FINDINGS: Stable marked elevation of the left hemidiaphragm with overlying vascular crowding and atelectasis. The heart is within normal limits  in size given the AP projection and portable technique. Stable surgical changes from coronary artery bypass surgery. No acute pulmonary findings. No pleural effusions or pneumothorax. The bony thorax is intact. IMPRESSION: 1. Stable marked elevation of the left hemidiaphragm with overlying vascular crowding and atelectasis. 2. No acute pulmonary findings. Electronically Signed   By: Marrian Siva M.D.   On: 01/14/2024 19:40   _______________________________________________________________________________________________________ Latest  Blood pressure (!) 145/100, pulse 99, temperature 98.1 F (36.7 C), temperature source Temporal, resp. rate (!) 22, height 6' (1.829 m), weight 95.3 kg, SpO2 100%.   Vitals  labs and radiology finding personally reviewed  Review of Systems:    Pertinent positives include: fall  Constitutional:  No weight loss, night sweats, Fevers, chills, fatigue, weight loss  HEENT:  No headaches, Difficulty swallowing,Tooth/dental problems,Sore throat,  No sneezing, itching, ear ache, nasal congestion, post nasal drip,  Cardio-vascular:  No chest pain, Orthopnea,  PND, anasarca, dizziness, palpitations.no Bilateral lower extremity swelling  GI:  No heartburn, indigestion, abdominal pain, nausea, vomiting, diarrhea, change in bowel habits, loss of appetite, melena, blood in stool, hematemesis Resp:  no shortness of breath at rest. No dyspnea on exertion, No excess mucus, no productive cough, No non-productive cough, No coughing up of blood.No change in color of mucus.No wheezing. Skin:  no rash or lesions. No jaundice GU:  no dysuria, change in color of urine, no urgency or frequency. No straining to urinate.  No flank pain.  Musculoskeletal:  No joint pain or no joint swelling. No decreased range of motion. No back pain.  Psych:  No change in mood or affect. No depression or anxiety. No memory loss.  Neuro: no localizing neurological complaints, no tingling, no weakness, no double vision, no gait abnormality, no slurred speech, no confusion  All systems reviewed and apart from HOPI all are negative _______________________________________________________________________________________________ Past Medical History:   Past Medical History:  Diagnosis Date   CAD (coronary artery disease)    Dyslipidemia    Hypertension    S/P CABG (coronary artery bypass graft) 1998   LIMA to LAD, VG to PDA, VG to first diagonal   Stroke [facility])      Past Surgical History:  Procedure Laterality Date   CHOLECYSTECTOMY  1998   CORONARY ARTERY BYPASS GRAFT  07/08/1997   LIMA to LAD, reverse SVG to PDA, reverse SVG to first diagonal (Dr. Mardell Shade)   TRANSTHORACIC ECHOCARDIOGRAM  11/29/2012   EF 45-50%, grade 1 diastolic dysfunction    Social History:  Ambulatory   walker        reports that he quit smoking about 35 years ago. His smoking use included cigars. He has never used smokeless tobacco. He reports that he does not drink alcohol and does not use drugs.     Family History:   Family History  Problem Relation Age of Onset   Emphysema Mother     Colon cancer Father    Colon polyps Father    Lung cancer Brother    Diabetes Neg Hx    Kidney disease Neg Hx    Esophageal cancer Neg Hx    ______________________________________________________________________________________________ Allergies: No Known Allergies   Prior to Admission medications   Medication Sig Start Date End Date Taking? Authorizing Provider  amLODipine  (NORVASC ) 5 MG tablet Take 1 tablet (5 mg total) by mouth daily. 08/15/23 08/14/24  Hilty, Aviva Lemmings, MD  apixaban  (ELIQUIS ) 2.5 MG TABS tablet TAKE 1 TABLET BY MOUTH TWICE A DAY Patient taking differently: Take  2.5 mg by mouth 2 (two) times daily. 01/06/22   Hilty, Aviva Lemmings, MD  atorvastatin  (LIPITOR ) 40 MG tablet TAKE 1 TABLET BY MOUTH EVERY DAY AT 6PM Patient taking differently: Take 40 mg by mouth at bedtime. 02/19/18   Hilty, Aviva Lemmings, MD  carvedilol  (COREG ) 3.125 MG tablet Take 1 tablet (3.125 mg total) by mouth 2 (two) times daily. Patient not taking: Reported on 10/25/2023 04/06/23 10/25/23  Goodrich, Callie E, PA-C  Garlic 1000 MG CAPS Take 1,000 mg by mouth daily.    [provider]  metoprolol  succinate (TOPROL -XL) 25 MG 24 hr tablet Take 25 mg by mouth at bedtime.    [provider]  Multiple Vitamins-Minerals (ALIVE MENS ENERGY PO) Take 1 tablet by mouth daily with breakfast.    [provider]  Omega-3 Fatty Acids (FISH OIL) 1000 MG CAPS Take 1,000 mg by mouth in the morning.    [provider]    ___________________________________________________________________________________________________ Physical Exam:    01/14/2024    9:00 PM 01/14/2024    6:15 PM 01/14/2024    6:00 PM  Vitals with BMI  Systolic 145 176 308  Diastolic 100 91 90  Pulse 99 89 86     1. General:  in No  Acute distress   Chronically ill -appearing 2. Psychological: Alert and   Oriented 3. Head/ENT:   Dry Mucous Membranes                          Head Non traumatic, neck supple                            Poor Dentition 4. SKIN:  decreased Skin turgor,  Skin clean Dry and intact no rash    5. Heart: Regular rate and rhythm no  Murmur, no Rub or gallop 6. Lungs:   no wheezes or crackles   7. Abdomen: Soft,  non-tender, Non distended bowel sounds present 8. Lower extremities: no clubbing, cyanosis, no  edema 9. Neurologically Grossly intact, moving all 4 extremities equally   10. MSK: Normal range of motion    Chart has been reviewed  ______________________________________________________________________________________________  Assessment/Plan  89 y.o. male with medical history significant of HLD CKD A-fib on Eliquis  thrombocytopenia hypertension  Admitted for possible syncope   Present on Admission:  Syncope  Atrial fibrillation (HCC)  CAD (coronary artery disease)  CKD (chronic kidney disease), stage III (HCC)  DVT, lower extremity (HCC)  Dyslipidemia  Essential hypertension  Elevated troponin  Cardiomyopathy, ischemic  NSVT (nonsustained ventricular tachycardia) (HCC)     Atrial fibrillation (HCC) Chronic stable resume Eliquis  2.5 mg p.o. twice daily And continue Toprol  25 mg daily  CAD (coronary artery disease) Chronic stable continue Toprol  25 mg daily continue Lipitor  40 mg daily  CKD (chronic kidney disease), stage III (HCC)  -chronic avoid nephrotoxic medications such as NSAIDs, Vanco Zosyn combo,  avoid hypotension, continue to follow renal function   DVT, lower extremity (HCC) Continue eliquis  2.5 mg po bid  Dyslipidemia Continue Lipitor  40 mg a day  Essential hypertension Resume Norvasc  5 mg daily and Toprol  25 mg daily  Syncope Questionable episode patient unable to provide detailed history but seems like he was walking and then he found himself on the floor Obtain echogram monitor on telemetry Troponin slightly elevated continue to trend if continues to go up may need cardiology consult  Elevated troponin Not in the  setting of chest  pain. Recent fall Could be demand related Continue to monitor patient is already on Eliquis  Continue to cycle cardiac enzymes If continues to rise will need cardiology consult Echo in the morning  Cardiomyopathy, ischemic Chronic systolic CHF. Current appears to be slightly on dry side.  History of CVA (cerebrovascular accident) Patient is on Eliquis  continue Lipitor  40 mg daily  NSVT (nonsustained ventricular tachycardia) (HCC) Will restart toporl    Other plan as per orders.  DVT prophylaxis:  eliquis     Code Status: DNR/DNI  as per patient   I had personally discussed CODE STATUS with patient and family  ACP  has been reviewed   Per patient he would like to avoid over aggressive interventions such as cardiac cath   Family Communication:   Family   at  Bedside  plan of care was discussed   with   Daughter,    Diet  Diet Orders (From admission, onward)     Start     Ordered   01/14/24 1712  Diet NPO time specified  Diet effective now        01/14/24 1713            Disposition Plan:       To home once workup is complete and patient is stable   Following barriers for discharge:                             Syncope  work up is complete                            Electrolytes corrected                                            Would benefit from PT/OT eval prior to DC  Ordered  Consults called: none   Admission status:  ED Disposition     ED Disposition  Admit   Condition  --   Comment  The patient appears reasonably stabilized for admission considering the current resources, flow, and capabilities available in the ED at this time, and I doubt any other [facility] requiring further screening and/or treatment in the ED prior to admission is  present.           Obs    Level of care     tele  For 12H      Sanaiya Welliver 01/14/2024, 11:42 PM    [facility]     after 2 AM please page floor coverage   If 7AM-7PM, please contact the day team taking  care of the patient using [email or web address]

## 2024-01-14 NOTE — ED Notes (Signed)
 Pt to CT with RN

## 2024-01-14 NOTE — Assessment & Plan Note (Signed)
 Not in the setting of chest pain. Recent fall Could be demand related Continue to monitor patient is already on Eliquis  Continue to cycle cardiac enzymes If continues to rise will need cardiology consult Echo in the morning

## 2024-01-14 NOTE — Subjective & Objective (Signed)
 Patient had an unwitnessed fall at home he is on Eliquis  for history of A-fib.  Found to have hematoma of right side of her head per EMS. At baseline has right-sided weakness and aphasia. Patient unsure how he fell. He was walking with a walker when this happened.  He lives alone at home but daughter checks on him frequently.

## 2024-01-14 NOTE — Assessment & Plan Note (Signed)
 Will restart toporl

## 2024-01-14 NOTE — Assessment & Plan Note (Addendum)
 Chronic systolic CHF. Current appears to be slightly on dry side.

## 2024-01-15 ENCOUNTER — Encounter (HOSPITAL_COMMUNITY): Payer: Self-pay | Admitting: Internal Medicine

## 2024-01-15 ENCOUNTER — Observation Stay (HOSPITAL_COMMUNITY)

## 2024-01-15 DIAGNOSIS — W19XXXA Unspecified fall, initial encounter: Secondary | ICD-10-CM

## 2024-01-15 DIAGNOSIS — R55 Syncope and collapse: Secondary | ICD-10-CM

## 2024-01-15 DIAGNOSIS — Y92009 Unspecified place in unspecified non-institutional (private) residence as the place of occurrence of the external cause: Secondary | ICD-10-CM

## 2024-01-15 DIAGNOSIS — I4811 Longstanding persistent atrial fibrillation: Secondary | ICD-10-CM | POA: Diagnosis not present

## 2024-01-15 DIAGNOSIS — I10 Essential (primary) hypertension: Secondary | ICD-10-CM | POA: Diagnosis not present

## 2024-01-15 DIAGNOSIS — N1832 Chronic kidney disease, stage 3b: Secondary | ICD-10-CM | POA: Diagnosis not present

## 2024-01-15 LAB — COMPREHENSIVE METABOLIC PANEL WITH GFR
ALT: 14 U/L (ref 0–44)
AST: 21 U/L (ref 15–41)
Albumin: 3.1 g/dL — ABNORMAL LOW (ref 3.5–5.0)
Alkaline Phosphatase: 47 U/L (ref 38–126)
Anion gap: 9 (ref 5–15)
BUN: 21 mg/dL (ref 8–23)
CO2: 23 mmol/L (ref 22–32)
Calcium: 9.1 mg/dL (ref 8.9–10.3)
Chloride: 107 mmol/L (ref 98–111)
Creatinine, Ser: 1.85 mg/dL — ABNORMAL HIGH (ref 0.61–1.24)
GFR, Estimated: 34 mL/min — ABNORMAL LOW (ref >60)
Glucose, Bld: 125 mg/dL — ABNORMAL HIGH (ref 70–99)
Potassium: 4.3 mmol/L (ref 3.5–5.1)
Sodium: 139 mmol/L (ref 135–145)
Total Bilirubin: 0.9 mg/dL (ref 0.0–1.2)
Total Protein: 5.8 g/dL — ABNORMAL LOW (ref 6.5–8.1)

## 2024-01-15 LAB — CBC
HCT: 38.2 % — ABNORMAL LOW (ref 39.0–52.0)
Hemoglobin: 12.8 g/dL — ABNORMAL LOW (ref 13.0–17.0)
MCH: 31.5 pg (ref 26.0–34.0)
MCHC: 33.5 g/dL (ref 30.0–36.0)
MCV: 94.1 fL (ref 80.0–100.0)
Platelets: 142 10*3/uL — ABNORMAL LOW (ref 150–400)
RBC: 4.06 MIL/uL — ABNORMAL LOW (ref 4.22–5.81)
RDW: 13.1 % (ref 11.5–15.5)
WBC: 9.4 10*3/uL (ref 4.0–10.5)
nRBC: 0 % (ref 0.0–0.2)

## 2024-01-15 LAB — TROPONIN I (HIGH SENSITIVITY)
Troponin I (High Sensitivity): 172 ng/L (ref <18)
Troponin I (High Sensitivity): 141 ng/L (ref <18)
Troponin I (High Sensitivity): 142 ng/L (ref <18)

## 2024-01-15 LAB — ECHOCARDIOGRAM COMPLETE
Height: 63 in
Weight: 3114.66 [oz_av]

## 2024-01-15 LAB — MAGNESIUM: Magnesium: 1.7 mg/dL (ref 1.7–2.4)

## 2024-01-15 LAB — PHOSPHORUS: Phosphorus: 2.9 mg/dL (ref 2.5–4.6)

## 2024-01-15 MED ORDER — MAGNESIUM SULFATE 2 GM/50ML IV SOLN
2.0000 g | Freq: Once | INTRAVENOUS | Status: AC
  Administered 2024-01-15: 2 g via INTRAVENOUS
  Filled 2024-01-15: qty 50

## 2024-01-15 MED ORDER — ACETAMINOPHEN 650 MG RE SUPP: 650.0000 mg | Freq: Four times a day (QID) | RECTAL | Status: DC | PRN

## 2024-01-15 MED ORDER — PERFLUTREN LIPID MICROSPHERE
1.0000 mL | INTRAVENOUS | Status: AC | PRN
  Administered 2024-01-15: 4 mL via INTRAVENOUS

## 2024-01-15 MED ORDER — ONDANSETRON HCL 4 MG PO TABS: 4.0000 mg | ORAL_TABLET | Freq: Four times a day (QID) | ORAL | Status: DC | PRN

## 2024-01-15 MED ORDER — MELATONIN 5 MG PO TABS
10.0000 mg | ORAL_TABLET | Freq: Every day | ORAL | Status: DC
  Administered 2024-01-15: 10 mg via ORAL
  Filled 2024-01-15: qty 2

## 2024-01-15 MED ORDER — ACETAMINOPHEN 325 MG PO TABS: 650.0000 mg | ORAL_TABLET | Freq: Four times a day (QID) | ORAL | Status: DC | PRN

## 2024-01-15 MED ORDER — ACETAMINOPHEN 500 MG PO TABS
1000.0000 mg | ORAL_TABLET | Freq: Every day | ORAL | Status: DC
Start: 2024-01-15 — End: 2024-01-16
  Administered 2024-01-15: 1000 mg via ORAL
  Filled 2024-01-15: qty 2

## 2024-01-15 MED ORDER — SODIUM CHLORIDE 0.9 % IV SOLN: INTRAVENOUS | Status: DC

## 2024-01-15 MED ORDER — AMLODIPINE BESYLATE 5 MG PO TABS
5.0000 mg | ORAL_TABLET | Freq: Every day | ORAL | Status: DC
  Administered 2024-01-15 – 2024-01-16 (×2): 5 mg via ORAL
  Filled 2024-01-15 (×2): qty 1

## 2024-01-15 MED ORDER — HYDROCODONE-ACETAMINOPHEN 5-325 MG PO TABS
1.0000 | ORAL_TABLET | ORAL | Status: DC | PRN
  Administered 2024-01-15: 1 via ORAL
  Filled 2024-01-15 (×2): qty 1

## 2024-01-15 MED ORDER — ONDANSETRON HCL 4 MG/2ML IJ SOLN: 4.0000 mg | Freq: Four times a day (QID) | INTRAMUSCULAR | Status: DC | PRN

## 2024-01-15 NOTE — Progress Notes (Signed)
  Echocardiogram 2D Echocardiogram has been performed.  Dione Franks 01/15/2024, 4:48 PM

## 2024-01-15 NOTE — Assessment & Plan Note (Signed)
 01-15-2024 on Eliquis .  01-16-2024 stable.

## 2024-01-15 NOTE — Assessment & Plan Note (Signed)
 01-15-2024  on lipitor .  01-16-2024 stable.

## 2024-01-15 NOTE — Evaluation (Signed)
 Physical Therapy Evaluation Patient Details Name: Patrick Giles MRN: [id number] DOB: 1933-04-28 Today's Date: 01/15/2024  History of Present Illness  The pt is an 89 yo male presenting 4/27 after an unwitnessed fall at home. Admitted for management of possible syncope. PMH includes: CVA with R-sided weakness, CAD, HTN, HLD, and afib.  Clinical Impression  Pt in bed upon arrival of PT, agreeable to evaluation at this time. Prior to admission the pt was ambulating with use of rollator of RW in his home and around .25 mile driveway with his dog. The pt does report 4 recent falls and largely sedentary lifestyle (in his recliner) other than his walking outside. The pt presents with deficits in endurance and power, needing minA to rise from chair and CGA to minA to manage 40 ft ambulation. The pt is typically alone during the day and independent with mobility, meal prep, and ADLs. Given his need for assistance for any OOB mobility and decreased activity tolerance, recommend continued post-acute rehab prior to return home to regain greater level of independence. If family prefers home with HHPT, recommend increased supervision and assistance available during the day until pt able to progress strength, power, and endurance to complete transfers without physical assistance and improve endurance for walking within the home. At this time pt is at great risk for continued falls given his hx of falls and R-sided weakness that fatigues quickly in his deconditioned state. Will continue to follow acutely to progress towards greatest level of independence.   BP stable with all changes in position and activity  5X Sit-to-Stand: 48.45 sec (> 14.8 sec indicates increased risk of falls for individuals aged 63-89, > 15 sec indicates increased risk of recurrent falls)       If plan is discharge home, recommend the following: A little help with walking and/or transfers;A little help with bathing/dressing/bathroom;Assistance  with cooking/housework;Assist for transportation;Help with stairs or ramp for entrance;Supervision due to cognitive status   Can travel by private vehicle   Yes    Equipment Recommendations None recommended by PT  Recommendations for Other Services       Functional Status Assessment Patient has had a recent decline in their functional status and demonstrates the ability to make significant improvements in function in a reasonable and predictable amount of time.     Precautions / Restrictions Precautions Precautions: Fall Recall of Precautions/Restrictions: Intact Restrictions Weight Bearing Restrictions Per Provider Order: No      Mobility  Bed Mobility               General bed mobility comments: pt OOB in recliner at start and end of session    Transfers Overall transfer level: Needs assistance Equipment used: Rolling walker (2 wheels) Transfers: Sit to/from Stand, Bed to chair/wheelchair/BSC Sit to Stand: Min assist   Step pivot transfers: Min assist       General transfer comment: minA to CGA with continued reps, pt dependent on UE support to power up and to control lower to chair. poorly controlled eccentric lower when ftigued    Ambulation/Gait Ambulation/Gait assistance: Min assist, Contact guard assist Gait Distance (Feet): 40 Feet Assistive device: Rolling walker (2 wheels) Gait Pattern/deviations: Step-through pattern, Decreased stride length, Decreased stance time - right, Decreased step length - left, Decreased dorsiflexion - right, Decreased weight shift to right Gait velocity: decreased Gait velocity interpretation: <1.31 ft/sec, indicative of household ambulator   General Gait Details: single episode of R knee buckling, progressively decreased clearance with fatigue. no  overt LOB but cues for use of RW and minA to assist with RW management     Balance Overall balance assessment: Needs assistance Sitting-balance support: Feet  unsupported Sitting balance-Leahy Scale: Fair   Postural control: Posterior lean Standing balance support: Reliant on assistive device for balance Standing balance-Leahy Scale: Poor                               Pertinent Vitals/Pain Pain Assessment Pain Assessment: No/denies pain    Home Living Family/patient expects to be discharged to:: Private residence Living Arrangements: Children Available Help at Discharge: Family;Available PRN/intermittently Type of Home: House Home Access: Level entry     Alternate Level Stairs-Number of Steps: 2 stairs to bedroom Home Layout: Multi-level Home Equipment: Agricultural consultant (2 wheels);Rollator (4 wheels);Shower seat;Cane - single point Additional Comments: Uses 4WRW    Prior Function Prior Level of Function : Independent/Modified Independent             Mobility Comments: Continues to drag R foot ADLs Comments: Continues to complete his own ADL, participtes with iADL.  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     Extremity/Trunk Assessment   Upper Extremity Assessment Upper Extremity Assessment: Defer to OT evaluation    Lower Extremity Assessment Lower Extremity Assessment: RLE deficits/detail RLE Deficits / Details: slightly decreased strength in RLE compared to LLE, fatigued more quickly with gait and progressive deficits with fatigue. pt's daughter reports some numbness in R foot, pt denies today RLE Sensation: WNL RLE Coordination: WNL    Cervical / Trunk Assessment Cervical / Trunk Assessment: Kyphotic  Communication   Communication Communication: No apparent difficulties Factors Affecting Communication: Hearing impaired    Cognition Arousal: Alert Behavior During Therapy: WFL for tasks assessed/performed                           PT - Cognition Comments: following commands and able to answer questions appropriately. not formally assessed Following  commands: Intact       Cueing Cueing Techniques: Verbal cues     General Comments General comments (skin integrity, edema, etc.): BP stable with changes in position    Exercises     Assessment/Plan    PT Assessment Patient needs continued PT services  PT Problem List Decreased strength;Decreased activity tolerance;Decreased balance;Decreased mobility       PT Treatment Interventions DME instruction;Stair training;Functional mobility training;Gait training;Therapeutic activities;Therapeutic exercise;Balance training;Patient/family education    PT Goals (Current goals can be found in the Care Plan section)  Acute Rehab PT Goals Patient Stated Goal: return home when safe PT Goal Formulation: With patient/family Time For Goal Achievement: 01/29/24 Potential to Achieve Goals: Good    Frequency Min 3X/week        AM-PAC PT "6 Clicks" Mobility  Outcome Measure Help needed turning from your back to your side while in a flat bed without using bedrails?: A Little Help needed moving from lying on your back to sitting on the side of a flat bed without using bedrails?: A Little Help needed moving to and from a bed to a chair (including a wheelchair)?: A Little Help needed standing up from a chair using your arms (e.g., wheelchair or bedside chair)?: A Little Help needed to walk in hospital room?: A Little Help needed climbing 3-5 steps with a railing? : A Lot 6 Click  Score: 17    End of Session Equipment Utilized During Treatment: Gait belt Activity Tolerance: Patient tolerated treatment well Patient left: in chair;with call bell/phone within reach;with family/visitor present Nurse Communication: Mobility status PT Visit Diagnosis: Unsteadiness on feet (R26.81);Other abnormalities of gait and mobility (R26.89);Repeated falls (R29.6);Muscle weakness (generalized) (M62.81);History of falling (Z91.81)    Time: 1610-9604 PT Time Calculation (min) (ACUTE ONLY): 30 min   Charges:    PT Evaluation $PT Eval Moderate Complexity: 1 Mod PT Treatments $Therapeutic Activity: 8-22 mins PT General Charges $$ ACUTE PT VISIT: 1 Visit         Barnabas Booth, PT, DPT   Acute Rehabilitation Department Office [phone] Secure Chat Communication Preferred  Lona Rist 01/15/2024, 3:52 PM

## 2024-01-15 NOTE — Assessment & Plan Note (Signed)
 01-15-2024 stable. Check echo.  01-16-2024 echo with poor windows.

## 2024-01-15 NOTE — Hospital Course (Signed)
 HPI: Patrick Giles is a 89 y.o. male with medical history significant of HLD CKD A-fib on Eliquis  thrombocytopenia hypertension.     Presented with an unwitnessed fall at home he is on Eliquis  for history of A-fib.  Found to have hematoma of right side of her head per EMS. At baseline has right-sided weakness and aphasia. Patient unsure how he fell. He was walking with a walker when this happened.  He lives alone at home but daughter checks on him frequently.   Patient has been admitted in January for ileus versus small bowel obstruction was managed nonoperatively and patient was able to be discharged home   He was siting watching TV, family thinks he got up was trying to get a remote and the next thing he was on the floor   No chest pain , no shortness of breath.  On a good day he is able to walk his dog   Significant Events: Admitted 01/14/2024 for fall. Possible syncope   Significant Labs: WBC 8.8, HgB 14.9, plt 155 Na 138, K 4.0, CO2 of 23, BUN 19, Scr 1.85, glu 121 UA negative  Significant Imaging Studies: CXR Stable marked elevation of the left hemidiaphragm with overlying vascular crowding and atelectasis. 2. No acute pulmonary findings CT head, c-spine Stable age related cerebral atrophy, ventriculomegaly and periventricular white matter disease. 2. No acute intracranial findings or skull fracture. 3. Stable opacification of the right mastoid air cells and fluid throughout the middle ear cavity. 4. No cervical spine fracture or acute findings CT abd/pelvis No acute abdominal/pelvic findings, mass lesions or adenopathy. 2. No acute bony findings. 3. Stable marked elevation of the left hemidiaphragm. 4. Stable vascular disease. 5. Stable bilateral renal cysts.   Antibiotic Therapy: Anti-infectives (From admission, onward)    None       Procedures:   Consultants:

## 2024-01-15 NOTE — Assessment & Plan Note (Signed)
 01-15-2024 flat. BP was elevated on arrival. Probably some demand mismatch. Not a NSTEMI.  01-16-2024 stable.

## 2024-01-15 NOTE — Evaluation (Signed)
 Occupational Therapy Evaluation Patient Details Name: Patrick Giles MRN: [id number] DOB: January 06, 1933 Today's Date: 01/15/2024   History of Present Illness   89 y.o. male with medical history significant of HLD CKD A-fib on Eliquis  thrombocytopenia hypertension.  Presented 4/27 with unwitnessed fall at home.  Found to have hematoma of right side of her head per EMS.  At baseline has right-sided weakness and aphasia, R leg is weaker than the upper extremity.  CT Head and X-rays have been negative.     Clinical Impressions Patient admitted for the diagnosis above.  PTA he lives at home, his daughter is now living with him, but works during the day.  The daughter assists with heavy iADL and community mobility.  Patient continues to participate with iADL and remains Mod I with ADL.  Currently he is weaker than baseline, and presents with poor activity tolerance, needing up to Mod A for bed mobility, up to Min A for basic stand pivot transfers and closer to Max A for lower body ADL.  Given level of assist, and that the daughter is not available, Patient will benefit from continued inpatient follow up therapy, <3 hours/day.  OT will follow in the acute setting to address deficits.       If plan is discharge home, recommend the following:   Assist for transportation;A lot of help with bathing/dressing/bathroom;A lot of help with walking and/or transfers;Assistance with cooking/housework     Functional Status Assessment   Patient has had a recent decline in their functional status and demonstrates the ability to make significant improvements in function in a reasonable and predictable amount of time.     Equipment Recommendations   BSC/3in1     Recommendations for Other Services         Precautions/Restrictions   Precautions Precautions: Fall Recall of Precautions/Restrictions: Intact Restrictions Weight Bearing Restrictions Per Provider Order: No     Mobility Bed  Mobility Overal bed mobility: Needs Assistance Bed Mobility: Supine to Sit, Sit to Supine     Supine to sit: Mod assist Sit to supine: Min assist   General bed mobility comments: difficulty elevating truck on stretcher    Transfers Overall transfer level: Needs assistance Equipment used: Rolling walker (2 wheels) Transfers: Sit to/from Stand, Bed to chair/wheelchair/BSC Sit to Stand: Min assist     Step pivot transfers: Min assist     General transfer comment: Increased time and unsteady      Balance Overall balance assessment: Needs assistance Sitting-balance support: Feet unsupported Sitting balance-Leahy Scale: Fair   Postural control: Posterior lean Standing balance support: Reliant on assistive device for balance Standing balance-Leahy Scale: Poor                             ADL either performed or assessed with clinical judgement   ADL Overall ADL's : Needs assistance/impaired Eating/Feeding: Set up;Sitting   Grooming: Wash/dry hands;Contact guard assist;Standing   Upper Body Bathing: Minimal assistance;Sitting   Lower Body Bathing: Moderate assistance;Sitting/lateral leans   Upper Body Dressing : Minimal assistance;Sitting   Lower Body Dressing: Maximal assistance;Sit to/from stand   Toilet Transfer: Minimal assistance;Stand-pivot;BSC/3in1                   Vision Patient Visual Report: No change from baseline       Perception Perception: Within Functional Limits       Praxis Praxis: [facility]       Pertinent Vitals/Pain  Pain Assessment Pain Assessment: No/denies pain     Extremity/Trunk Assessment Upper Extremity Assessment Upper Extremity Assessment: Overall WFL for tasks assessed   Lower Extremity Assessment Lower Extremity Assessment: Defer to PT evaluation   Cervical / Trunk Assessment Cervical / Trunk Assessment: Kyphotic   Communication Communication Communication: No apparent difficulties Factors Affecting  Communication: Hearing impaired   Cognition Arousal: Alert Behavior During Therapy: WFL for tasks assessed/performed Cognition: No apparent impairments                               Following commands: Intact       Cueing  General Comments   Cueing Techniques: Verbal cues   No orthostatic hypotension noted with positional changes.     Exercises     Shoulder Instructions      Home Living Family/patient expects to be discharged to:: Private residence Living Arrangements: Children Available Help at Discharge: Family;Available PRN/intermittently Type of Home: House Home Access: Level entry     Home Layout: Multi-level Alternate Level Stairs-Number of Steps: 2 stairs to bedroom Alternate Level Stairs-Rails: Right Bathroom Shower/Tub: Chief Strategy Officer: Standard Bathroom Accessibility: Yes How Accessible: Accessible via walker Home Equipment: Rolling Walker (2 wheels);Rollator (4 wheels);Shower seat;Cane - single point   Additional Comments: Uses 4WRW      Prior Functioning/Environment Prior Level of Function : Independent/Modified Independent             Mobility Comments: Continues to drag R foot ADLs Comments: Continues to complete his own ADL, participtes with iADL.  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    OT Problem List: Decreased strength;Decreased activity tolerance;Impaired balance (sitting and/or standing)   OT Treatment/Interventions: Self-care/ADL training;Therapeutic activities;Balance training;Patient/family education;DME and/or AE instruction      OT Goals(Current goals can be found in the care plan section)   Acute Rehab OT Goals Patient Stated Goal: Return home OT Goal Formulation: With patient Time For Goal Achievement: 01/29/24 Potential to Achieve Goals: Fair ADL Goals Pt Will Perform Grooming: with modified independence;standing Pt Will Perform Lower Body  Dressing: with modified independence;sit to/from stand Pt Will Transfer to Toilet: with modified independence;regular height toilet;ambulating   OT Frequency:  Min 2X/week    Co-evaluation              AM-PAC OT "6 Clicks" Daily Activity     Outcome Measure Help from another person eating meals?: None Help from another person taking care of personal grooming?: A Little Help from another person toileting, which includes using toliet, bedpan, or urinal?: A Little Help from another person bathing (including washing, rinsing, drying)?: A Lot Help from another person to put on and taking off regular upper body clothing?: A Little Help from another person to put on and taking off regular lower body clothing?: A Lot 6 Click Score: 17   End of Session Equipment Utilized During Treatment: Gait belt;Rolling walker (2 wheels) Nurse Communication: Mobility status  Activity Tolerance: Patient tolerated treatment well Patient left: in bed;with call bell/phone within reach  OT Visit Diagnosis: Unsteadiness on feet (R26.81);Muscle weakness (generalized) (M62.81)                Time: 1610-9604 OT Time Calculation (min): 21 min Charges:  OT General Charges $OT Visit: 1 Visit OT Evaluation $OT Eval Moderate Complexity: 1 Mod  01/15/2024  RP, OTR/L  Acute Rehabilitation Services  Office:  [phone]   Patrick Giles 01/15/2024, 1:09 PM

## 2024-01-15 NOTE — Assessment & Plan Note (Signed)
 01-15-2024 no injury. Has some lacerations that were repaired. Awaiting PT consult and echo. Plan to monitor overnight and DC in AM. Stop IVF.  01-16-2024 pt able to work with PT yesterday. RW ordered for him. Home health PT ordered for him per family request.

## 2024-01-15 NOTE — Progress Notes (Addendum)
 PROGRESS NOTE    Patrick Giles  Patrick Giles DOB: Patrick Giles DOA: 01/14/2024 PCP: Patrick Handsome, MD  Subjective: Pt seen and examined. Met with two of his 4 dtr at bedside. Patrick Giles and Patrick Giles. His other dtrs are Patrick Giles and Patrick Giles.  Pt does not recall how he fell  Dtr Patrick Giles is currently staying with pt. Pt lives in a townhome. He is alone during the day when his dtrs are at work.  Pt feels fine otherwise. Does not recall how he ended up on the floor.  Echo pending.  Dtr Patrick Giles is a ST at a SNF. She wants pt to get home PT. She declines for pt to be discharged to SNF.  Pt eat his entire lunch during patient interview, except for his vegetables(broccoli). Dtr Patrick Giles states that this is normal and pt does not normally eat vegetables.  Dtr note that pt has trouble staying asleep. Will wake up at 2 AM and not be able to go back to bed.  Pt has been taking tylenol  PM for about 2 months. Advised to stop tylenol  PM as it contains benadryl.  Suggested tylenol  and melatonin.   Hospital Course: HPI: Patrick Giles is a 89 y.o. male with medical history significant of HLD CKD A-fib on Eliquis  thrombocytopenia hypertension.     Presented with an unwitnessed fall at home he is on Eliquis  for history of A-fib.  Found to have hematoma of right side of her head per EMS. At baseline has right-sided weakness and aphasia. Patient unsure how he fell. He was walking with a walker when this happened.  He lives alone at home but daughter checks on him frequently.   Patient has been admitted in January for ileus versus small bowel obstruction was managed nonoperatively and patient was able to be discharged home   He was siting watching TV, family thinks he got up was trying to get a remote and the next thing he was on the floor   No chest pain , no shortness of breath.  On a good day he is able to walk his dog   Significant Events: Admitted 01/14/2024 for fall. Possible syncope   Significant  Labs: WBC 8.8, HgB 14.9, plt 155 Na 138, K 4.0, CO2 of 23, BUN 19, Scr 1.85, glu 121 UA negative  Significant Imaging Studies: CXR Stable marked elevation of the left hemidiaphragm with overlying vascular crowding and atelectasis. 2. No acute pulmonary findings CT head, c-spine Stable age related cerebral atrophy, ventriculomegaly and periventricular white matter disease. 2. No acute intracranial findings or skull fracture. 3. Stable opacification of the right mastoid air cells and fluid throughout the middle ear cavity. 4. No cervical spine fracture or acute findings CT abd/pelvis No acute abdominal/pelvic findings, mass lesions or adenopathy. 2. No acute bony findings. 3. Stable marked elevation of the left hemidiaphragm. 4. Stable vascular disease. 5. Stable bilateral renal cysts.   Antibiotic Therapy: Anti-infectives (From admission, onward)    None       Procedures:   Consultants:     Assessment and Plan: * Fall at home, initial encounter 01-15-2024 no injury. Has some lacerations that were repaired. Awaiting PT consult and echo. Plan to monitor overnight and DC in AM. Stop IVF.  History of CVA (cerebrovascular accident) 01-15-2024 chronic.  Elevated troponin 01-15-2024 flat. BP was elevated on arrival. Probably some demand mismatch. Not a NSTEMI.  Chronic a-fib (HCC) 01-15-2024 continue eliquis . Pt on Toprol -XL for rate controlling meds.  History of DVT (deep  vein thrombosis) 01-15-2024 on Eliquis .  Essential hypertension 01-15-2024 stable on Toprol -XL and norvasc .  CKD stage 3b, GFR 30-44 ml/min (HCC) 01-15-2024 baseline scr 1.8-2.0  Dyslipidemia 01-15-2024  on lipitor .  Cardiomyopathy, ischemic 01-15-2024 stable. Check echo.  CAD (coronary artery disease) 01-15-2024 stable. On Toprol -XL, lipitor   DVT prophylaxis: apixaban  (ELIQUIS ) tablet 2.5 mg Start: 01/14/24 2345 apixaban  (ELIQUIS ) tablet 2.5 mg     Code Status: Limited: Do not attempt  resuscitation (DNR) -DNR-LIMITED -Do Not Intubate/DNI  Family Communication: discussed with both dtrs Patrick Giles and Patrick Giles at bedside. Discussed hospital acquired delirium and sundowning. Disposition Plan: return home Reason for continuing need for hospitalization: monitor HR. Awaiting PT eval. Awaiting echo.  Objective: Vitals:   01/15/24 0945 01/15/24 1100 01/15/24 1115 01/15/24 1229  BP:  127/86    Pulse:  61 66   Resp:  (!) 25 (!) 24   Temp: 98.5 F (36.9 C)   98.3 F (36.8 C)  TempSrc: Oral   Oral  SpO2:  97% 96%   Weight:    88.3 kg  Height:    5\' 3"  (1.6 m)    Intake/Output Summary (Last 24 hours) at 01/15/2024 1454 Last data filed at 01/14/2024 1949 Gross per 24 hour  Intake 1000 ml  Output --  Net 1000 ml   Filed Weights   01/14/24 1713 01/15/24 1229  Weight: 95.3 kg 88.3 kg    Examination:  Physical Exam Vitals and nursing note reviewed.  Constitutional:      General: He is not in acute distress.    Appearance: He is not toxic-appearing or diaphoretic.  HENT:     Head: Normocephalic and atraumatic.  Eyes:     General: No scleral icterus. Cardiovascular:     Rate and Rhythm: Normal rate. Rhythm irregular.  Pulmonary:     Effort: Pulmonary effort is normal.     Breath sounds: Normal breath sounds.  Abdominal:     General: Bowel sounds are normal.     Palpations: Abdomen is soft.  Skin:    General: Skin is warm and dry.     Capillary Refill: Capillary refill takes less than 2 seconds.     Comments: Laceration repaired on right elbow.  Neurological:     Mental Status: He is alert and oriented to person, place, and time.     Data Reviewed: I have personally reviewed following labs and imaging studies  CBC: Recent Labs  Lab 01/14/24 1712 01/14/24 1723 01/15/24 0747  WBC 8.8  --  9.4  HGB 14.9 15.6 12.8*  HCT 46.1 46.0 38.2*  MCV 95.8  --  94.1  PLT 155  --  142*   Basic Metabolic Panel: Recent Labs  Lab 01/14/24 1712 01/14/24 1723  01/15/24 0747  NA 138 141 139  K 4.0 4.0 4.3  CL 104 105 107  CO2 23  --  23  GLUCOSE 121* 116* 125*  BUN 19 23 21   CREATININE 1.85* 2.00* 1.85*  CALCIUM  9.7  --  9.1  MG  --   --  1.7  PHOS  --   --  2.9   GFR: Estimated Creatinine Clearance: 26.6 mL/min (A) (by C-G formula based on SCr of 1.85 mg/dL (H)). Liver Function Tests: Recent Labs  Lab 01/14/24 1712 01/15/24 0747  AST 22 21  ALT 17 14  ALKPHOS 59 47  BILITOT 0.8 0.9  PROT 7.0 5.8*  ALBUMIN 4.0 3.1*   Coagulation Profile: Recent Labs  Lab 01/14/24 1712  INR 1.0  Sepsis Labs: Recent Labs  Lab 01/14/24 1723  LATICACIDVEN 1.3   Radiology Studies: CT ABDOMEN PELVIS W CONTRAST Result Date: 01/14/2024 CLINICAL DATA:  Marvell Slider. EXAM: CT ABDOMEN AND PELVIS WITH CONTRAST TECHNIQUE: Multidetector CT imaging of the abdomen and pelvis was performed using the standard protocol following bolus administration of intravenous contrast. RADIATION DOSE REDUCTION: This exam was performed according to the departmental dose-optimization program which includes automated exposure control, adjustment of the mA and/or kV according to patient size and/or use of iterative reconstruction technique. CONTRAST:  75mL OMNIPAQUE IOHEXOL 350 MG/ML SOLN COMPARISON:  CT scan 10/02/2022 FINDINGS: Lower chest: Marked elevation of the left hemidiaphragm. The right lung is grossly clear except for dependent subpleural atelectasis/edema. No pleural effusion or pneumothorax. The heart is normal in size. Surgical changes from bypass surgery. Aortic and coronary artery calcifications. Stable pleural calcifications on the left. Hepatobiliary: No hepatic injury or perihepatic hematoma. Gallbladder is surgically absent. No common bile duct dilatation. Pancreas: Stable mild pancreatic atrophy but no mass, inflammation or ductal dilatation. No peripancreatic fluid collections. Spleen: No splenic injury or perisplenic hematoma. Small accessory spleen. Adrenals/Urinary  Tract: The adrenal glands are normal. Stable numerous bilateral renal cysts not requiring any further imaging evaluation or follow-up. The bladder is unremarkable. Stomach/Bowel: The stomach, duodenum, small bowel and colon are grossly normal without oral contrast. No inflammatory changes, mass lesions or obstructive findings. The appendix is normal. Vascular/Lymphatic: Stable age related atherosclerotic calcifications involving the aorta and branch vessels. No aneurysm or dissection. The major venous structures are patent. No mesenteric or retroperitoneal mass, adenopathy or hematoma. Reproductive: The prostate gland and seminal vesicles are unremarkable. Other: No pelvic mass or adenopathy. No free pelvic fluid collections. No inguinal mass or adenopathy. No abdominal wall hernia or subcutaneous lesions. Musculoskeletal: No acute bony findings. No acute lower rib fractures. Remote healed left rib fractures are noted. The lumbar vertebral bodies are intact. The pubic symphysis and SI joints are intact. No pelvic or hip fractures. IMPRESSION: 1. No acute abdominal/pelvic findings, mass lesions or adenopathy. 2. No acute bony findings. 3. Stable marked elevation of the left hemidiaphragm. 4. Stable vascular disease. 5. Stable bilateral renal cysts. Aortic Atherosclerosis (ICD10-I70.0). Electronically Signed   By: Marrian Siva M.D.   On: 01/14/2024 19:51   CT HEAD WO CONTRAST Result Date: 01/14/2024 CLINICAL DATA:  Marvell Slider.  Hit head. EXAM: CT HEAD WITHOUT CONTRAST CT CERVICAL SPINE WITHOUT CONTRAST TECHNIQUE: Multidetector CT imaging of the head and cervical spine was performed following the standard protocol without intravenous contrast. Multiplanar CT image reconstructions of the cervical spine were also generated. RADIATION DOSE REDUCTION: This exam was performed according to the departmental dose-optimization program which includes automated exposure control, adjustment of the mA and/or kV according to patient  size and/or use of iterative reconstruction technique. COMPARISON:  CT scan 10/25/2023 FINDINGS: CT HEAD FINDINGS Brain: Stable age related cerebral atrophy, ventriculomegaly and periventricular white matter disease. No extra-axial fluid collections are identified. No CT findings for acute hemispheric infarction or intracranial hemorrhage. No mass lesions. The brainstem and cerebellum are normal. Vascular: Stable vascular calcifications.  No hyperdense vessels. Skull: No skull fracture or bone lesions. Sinuses/Orbits: The paranasal sinuses and left mastoid air cells are clear. Stable opacification of the right mastoid air cells and fluid throughout the middle ear cavity. Other: No scalp lesions or scalp hematoma. CT CERVICAL SPINE FINDINGS Alignment: Stable degenerative cervical spondylosis and straightening of the cervical lordosis. Skull base and vertebrae: No acute fracture. No primary bone lesion  or focal pathologic process. Soft tissues and spinal canal: No prevertebral fluid or swelling. No visible canal hematoma. Disc levels: The spinal canal is generous. No significant canal stenosis. Upper chest: The lung apices are grossly clear. Other: No cervical adenopathy or hematoma. Stable carotid artery calcifications. IMPRESSION: 1. Stable age related cerebral atrophy, ventriculomegaly and periventricular white matter disease. 2. No acute intracranial findings or skull fracture. 3. Stable opacification of the right mastoid air cells and fluid throughout the middle ear cavity. 4. No cervical spine fracture or acute findings. Electronically Signed   By: Marrian Siva M.D.   On: 01/14/2024 19:44   CT CERVICAL SPINE WO CONTRAST Result Date: 01/14/2024 CLINICAL DATA:  Marvell Slider.  Hit head. EXAM: CT HEAD WITHOUT CONTRAST CT CERVICAL SPINE WITHOUT CONTRAST TECHNIQUE: Multidetector CT imaging of the head and cervical spine was performed following the standard protocol without intravenous contrast. Multiplanar CT image  reconstructions of the cervical spine were also generated. RADIATION DOSE REDUCTION: This exam was performed according to the departmental dose-optimization program which includes automated exposure control, adjustment of the mA and/or kV according to patient size and/or use of iterative reconstruction technique. COMPARISON:  CT scan 10/25/2023 FINDINGS: CT HEAD FINDINGS Brain: Stable age related cerebral atrophy, ventriculomegaly and periventricular white matter disease. No extra-axial fluid collections are identified. No CT findings for acute hemispheric infarction or intracranial hemorrhage. No mass lesions. The brainstem and cerebellum are normal. Vascular: Stable vascular calcifications.  No hyperdense vessels. Skull: No skull fracture or bone lesions. Sinuses/Orbits: The paranasal sinuses and left mastoid air cells are clear. Stable opacification of the right mastoid air cells and fluid throughout the middle ear cavity. Other: No scalp lesions or scalp hematoma. CT CERVICAL SPINE FINDINGS Alignment: Stable degenerative cervical spondylosis and straightening of the cervical lordosis. Skull base and vertebrae: No acute fracture. No primary bone lesion or focal pathologic process. Soft tissues and spinal canal: No prevertebral fluid or swelling. No visible canal hematoma. Disc levels: The spinal canal is generous. No significant canal stenosis. Upper chest: The lung apices are grossly clear. Other: No cervical adenopathy or hematoma. Stable carotid artery calcifications. IMPRESSION: 1. Stable age related cerebral atrophy, ventriculomegaly and periventricular white matter disease. 2. No acute intracranial findings or skull fracture. 3. Stable opacification of the right mastoid air cells and fluid throughout the middle ear cavity. 4. No cervical spine fracture or acute findings. Electronically Signed   By: Marrian Siva M.D.   On: 01/14/2024 19:44   DG Chest Port 1 View Result Date: 01/14/2024 CLINICAL DATA:   Marvell Slider.  Chest pain. EXAM: PORTABLE CHEST 1 VIEW COMPARISON:  Chest x-ray 10/25/2023 FINDINGS: Stable marked elevation of the left hemidiaphragm with overlying vascular crowding and atelectasis. The heart is within normal limits in size given the AP projection and portable technique. Stable surgical changes from coronary artery bypass surgery. No acute pulmonary findings. No pleural effusions or pneumothorax. The bony thorax is intact. IMPRESSION: 1. Stable marked elevation of the left hemidiaphragm with overlying vascular crowding and atelectasis. 2. No acute pulmonary findings. Electronically Signed   By: Marrian Siva M.D.   On: 01/14/2024 19:40    Scheduled Meds:  acetaminophen   1,000 mg Oral QHS   amLODipine   5 mg Oral Daily   apixaban   2.5 mg Oral BID   atorvastatin   40 mg Oral QHS   melatonin  10 mg Oral QHS   metoprolol  succinate  25 mg Oral QHS   Continuous Infusions:  magnesium  sulfate bolus IVPB  LOS: 0 days   Time spent: 60 minutes  Unk Garb, DO  [facility]  01/15/2024, 2:54 PM

## 2024-01-15 NOTE — Assessment & Plan Note (Signed)
 01-15-2024 stable on Toprol -XL and norvasc .  01-16-2024 stable.

## 2024-01-15 NOTE — Progress Notes (Signed)
 Transition of Care [facility]) - CAGE-AID Screening   Patient Details  Name: Patrick Giles MRN: [id number] Date of Birth: 1933/05/03  Asa Bjork, RN Trauma Response Nurse Phone Number: [phone] 01/15/2024, 10:19 AM      CAGE-AID Screening:    Have You Ever Felt You Ought to Cut Down on Your Drinking or Drug Use?: No Have People Annoyed You By Critizing Your Drinking Or Drug Use?: No Have You Felt Bad Or Guilty About Your Drinking Or Drug Use?: No Have You Ever Had a Drink or Used Drugs First Thing In The Morning to Steady Your Nerves or to Get Rid of a Hangover?: No CAGE-AID Score: 0  Substance Abuse Education Offered: (S) No (Declines services at this time)

## 2024-01-15 NOTE — Assessment & Plan Note (Signed)
 01-15-2024 baseline scr 1.8-2.0  01-16-2024 stable.

## 2024-01-15 NOTE — Assessment & Plan Note (Addendum)
 01-15-2024 continue eliquis . Pt on Toprol -XL for rate controlling meds.  01-16-2024 stable.

## 2024-01-15 NOTE — Care Management Obs Status (Signed)
 MEDICARE OBSERVATION STATUS NOTIFICATION   Patient Details  Name: Patrick Giles MRN: [id number] Date of Birth: November 12, 1932   Medicare Observation Status Notification Given:  Yes    Janith Melnick 01/15/2024, 3:36 PM

## 2024-01-15 NOTE — Assessment & Plan Note (Signed)
 01-15-2024 chronic.  01-16-2024 stable.

## 2024-01-15 NOTE — Assessment & Plan Note (Signed)
 01-15-2024 stable. On Toprol -XL, lipitor   01-16-2024 stable.

## 2024-01-15 NOTE — Subjective & Objective (Addendum)
 Pt seen and examined. Stable overnight. Echo with poor windows. Pt feeling fine. No issues overnight. Able to work with PT yesterday. Pt is ready to go home.

## 2024-01-16 DIAGNOSIS — I2510 Atherosclerotic heart disease of native coronary artery without angina pectoris: Secondary | ICD-10-CM | POA: Diagnosis not present

## 2024-01-16 DIAGNOSIS — I255 Ischemic cardiomyopathy: Secondary | ICD-10-CM | POA: Diagnosis not present

## 2024-01-16 DIAGNOSIS — I482 Chronic atrial fibrillation, unspecified: Secondary | ICD-10-CM | POA: Diagnosis not present

## 2024-01-16 DIAGNOSIS — W19XXXA Unspecified fall, initial encounter: Secondary | ICD-10-CM | POA: Diagnosis not present

## 2024-01-16 MED ORDER — MELATONIN 10 MG PO TABS: 10.0000 mg | ORAL_TABLET | Freq: Every day | ORAL | Status: AC

## 2024-01-16 MED ORDER — HYDROCODONE-ACETAMINOPHEN 5-325 MG PO TABS: 1.0000 | ORAL_TABLET | Freq: Four times a day (QID) | ORAL | 0 refills | Status: AC | PRN

## 2024-01-16 NOTE — Progress Notes (Signed)
 PROGRESS NOTE    Patrick Giles  ZOX:[id number] DOB: 02/16/1933 DOA: 01/14/2024 PCP: Emaline Handsome, MD  Subjective: Pt seen and examined. Stable overnight. Echo with poor windows. Pt feeling fine. No issues overnight. Able to work with PT yesterday. Pt is ready to go home.   Hospital Course: HPI: Patrick Giles is a 89 y.o. male with medical history significant of HLD CKD A-fib on Eliquis  thrombocytopenia hypertension.     Presented with an unwitnessed fall at home he is on Eliquis  for history of A-fib.  Found to have hematoma of right side of her head per EMS. At baseline has right-sided weakness and aphasia. Patient unsure how he fell. He was walking with a walker when this happened.  He lives alone at home but daughter checks on him frequently.   Patient has been admitted in January for ileus versus small bowel obstruction was managed nonoperatively and patient was able to be discharged home   He was siting watching TV, family thinks he got up was trying to get a remote and the next thing he was on the floor   No chest pain , no shortness of breath.  On a good day he is able to walk his dog   Significant Events: Admitted 01/14/2024 for fall. Possible syncope   Significant Labs: WBC 8.8, HgB 14.9, plt 155 Na 138, K 4.0, CO2 of 23, BUN 19, Scr 1.85, glu 121 UA negative  Significant Imaging Studies: CXR Stable marked elevation of the left hemidiaphragm with overlying vascular crowding and atelectasis. 2. No acute pulmonary findings CT head, c-spine Stable age related cerebral atrophy, ventriculomegaly and periventricular white matter disease. 2. No acute intracranial findings or skull fracture. 3. Stable opacification of the right mastoid air cells and fluid throughout the middle ear cavity. 4. No cervical spine fracture or acute findings CT abd/pelvis No acute abdominal/pelvic findings, mass lesions or adenopathy. 2. No acute bony findings. 3. Stable marked elevation of the left  hemidiaphragm. 4. Stable vascular disease. 5. Stable bilateral renal cysts.   Antibiotic Therapy: Anti-infectives (From admission, onward)    None       Procedures:   Consultants:     Assessment and Plan: * Fall at home, initial encounter 01-15-2024 no injury. Has some lacerations that were repaired. Awaiting PT consult and echo. Plan to monitor overnight and DC in AM. Stop IVF.  01-16-2024 pt able to work with PT yesterday. RW ordered for him. Home health PT ordered for him per family request.  History of CVA (cerebrovascular accident) 01-15-2024 chronic.  01-16-2024 stable.  Elevated troponin 01-15-2024 flat. BP was elevated on arrival. Probably some demand mismatch. Not a NSTEMI.  01-16-2024 stable.  Chronic a-fib (HCC) 01-15-2024 continue eliquis . Pt on Toprol -XL for rate controlling meds.  01-16-2024 stable.  History of DVT (deep vein thrombosis) 01-15-2024 on Eliquis .  01-16-2024 stable.  Essential hypertension 01-15-2024 stable on Toprol -XL and norvasc .  01-16-2024 stable.  CKD stage 3b, GFR 30-44 ml/min (HCC) 01-15-2024 baseline scr 1.8-2.0  01-16-2024 stable.  Dyslipidemia 01-15-2024  on lipitor .  01-16-2024 stable.  Cardiomyopathy, ischemic 01-15-2024 stable. Check echo.  01-16-2024 echo with poor windows.  CAD (coronary artery disease) 01-15-2024 stable. On Toprol -XL, lipitor   01-16-2024 stable.   DVT prophylaxis: apixaban  (ELIQUIS ) tablet 2.5 mg Start: 01/14/24 2345 apixaban  (ELIQUIS ) tablet 2.5 mg     Code Status: Limited: Do not attempt resuscitation (DNR) -DNR-LIMITED -Do Not Intubate/DNI  Family Communication: talked with 2 dtrs [place] and [place] yesterday Disposition Plan:  return home Reason for continuing need for hospitalization: stable for DC  Objective: Vitals:   01/15/24 2135 01/16/24 0044 01/16/24 0411 01/16/24 0910  BP: (!) 156/77 124/81 127/70 139/67  Pulse: 78 (!) 55 65 62  Resp:  16 18 18   Temp:  98.2 F (36.8  C) 97.9 F (36.6 C) 97.6 F (36.4 C)  TempSrc:  Oral Oral Oral  SpO2:  91% 97% 97%  Weight:   (P) 89.4 kg   Height:        Intake/Output Summary (Last 24 hours) at 01/16/2024 1005 Last data filed at 01/16/2024 0219 Gross per 24 hour  Intake 197.86 ml  Output 100 ml  Net 97.86 ml   Filed Weights   01/14/24 1713 01/15/24 1229 01/16/24 0411  Weight: 95.3 kg 88.3 kg (P) 89.4 kg    Examination:  Physical Exam Vitals and nursing note reviewed.  Constitutional:      General: He is not in acute distress.    Appearance: He is not toxic-appearing or diaphoretic.  HENT:     Head: Normocephalic and atraumatic.  Cardiovascular:     Rate and Rhythm: Normal rate. Rhythm irregular.  Pulmonary:     Effort: Pulmonary effort is normal. No respiratory distress.     Breath sounds: Normal breath sounds. No wheezing.  Abdominal:     General: Abdomen is protuberant. Bowel sounds are normal. There is no distension.     Palpations: Abdomen is soft.  Skin:    General: Skin is warm and dry.     Capillary Refill: Capillary refill takes less than 2 seconds.  Neurological:     Mental Status: He is alert and oriented to person, place, and time.     Data Reviewed: I have personally reviewed following labs and imaging studies  CBC: Recent Labs  Lab 01/14/24 1712 01/14/24 1723 01/15/24 0747  WBC 8.8  --  9.4  HGB 14.9 15.6 12.8*  HCT 46.1 46.0 38.2*  MCV 95.8  --  94.1  PLT 155  --  142*   Basic Metabolic Panel: Recent Labs  Lab 01/14/24 1712 01/14/24 1723 01/15/24 0747  NA 138 141 139  K 4.0 4.0 4.3  CL 104 105 107  CO2 23  --  23  GLUCOSE 121* 116* 125*  BUN 19 23 21   CREATININE 1.85* 2.00* 1.85*  CALCIUM  9.7  --  9.1  MG  --   --  1.7  PHOS  --   --  2.9   GFR: Estimated Creatinine Clearance: 26.6 mL/min (A) (by C-G formula based on SCr of 1.85 mg/dL (H)). Liver Function Tests: Recent Labs  Lab 01/14/24 1712 01/15/24 0747  AST 22 21  ALT 17 14  ALKPHOS 59 47   BILITOT 0.8 0.9  PROT 7.0 5.8*  ALBUMIN 4.0 3.1*   Coagulation Profile: Recent Labs  Lab 01/14/24 1712  INR 1.0   Sepsis Labs: Recent Labs  Lab 01/14/24 1723  LATICACIDVEN 1.3   Radiology Studies: ECHOCARDIOGRAM COMPLETE Result Date: 01/15/2024    ECHOCARDIOGRAM REPORT   Patient Name:   Patrick Giles Date of Exam: 01/15/2024 Medical Rec #:  [id number]     Height:       63.0 in Accession #:    [id number]    Weight:       194.7 lb Date of Birth:  Apr 30, 1933     BSA:          1.912 m Patient Age:    76 years  BP:           127/86 mmHg Patient Gender: M             HR:           74 bpm. Exam Location:  Inpatient Procedure: 2D Echo (Both Spectral and Color Flow Doppler were utilized during            procedure). Indications:    syncope  History:        Patient has prior history of Echocardiogram examinations, most                 recent 06/18/2018. Cardiomyopathy, CAD, Prior CABG, chronic                 kidney disease; Risk Factors:Hypertension and Dyslipidemia.  Sonographer:    Dione Franks RDCS Referring Phys: [id number] ANASTASSIA DOUTOVA  Sonographer Comments: Technically difficult study due to poor echo windows, Technically challenging study due to limited acoustic windows, suboptimal parasternal window and suboptimal apical window. Image acquisition challenging due to respiratory motion. IMPRESSIONS  1. Technically difficult study.  2. Extremely poor study quality despite utilization of Definity . Accurate assessment of left ventricular ejection function is not possible. Recommend different modality to re-evaluate LVEF. Left ventricular endocardial border not optimally defined to evaluate regional wall motion.  3. Right ventricular systolic function was not well visualized. The right ventricular size is not well visualized.  4. Left atrial size was grossly normal.  5. Pericardial effusion grossly not present.  6. The mitral valve was not well visualized.  7. The aortic valve was not well  visualized. Aortic valve regurgitation is not visualized.  8. Aortic dilatation noted. There is dilatation of the aortic root, measuring 45 mm.  9. The inferior vena cava is dilated in size with >50% respiratory variability, suggesting right atrial pressure of 8 mmHg. Comparison(s): A prior study was performed on 01/07/2016. Images reviewed - mildly reduced LVEF with global hyopkinesis and no significant valvular heart disease. Conclusion(s)/Recommendation(s): Consider different modality such as TEE or MRI if clinically indicated. Consider CTA Aorta protocol to evaluate the aortic root and proximal ascending aorta. FINDINGS  Left Ventricle: Extremely poor study quality despite utilization of Definity . Accurate assessment of left ventricular ejection function is not possible. Recommend different modality to re-evaluate LVEF. Left ventricular endocardial border not optimally defined to evaluate regional wall motion. Definity  contrast agent was given IV to delineate the left ventricular endocardial borders. Right Ventricle: The right ventricular size is not well visualized. Right vetricular wall thickness was not assessed. Right ventricular systolic function was not well visualized. Left Atrium: Left atrial size was grossly normal. Right Atrium: Right atrial size was not well visualized. Pericardium: Pericardial effusion grossly not present. Mitral Valve: The mitral valve was not well visualized. Tricuspid Valve: The tricuspid valve is not well visualized. Tricuspid valve regurgitation is not demonstrated. Aortic Valve: The aortic valve was not well visualized. Aortic valve regurgitation is not visualized. Pulmonic Valve: The pulmonic valve was grossly normal. Pulmonic valve regurgitation is not visualized. Aorta: Aortic dilatation noted. There is dilatation of the aortic root, measuring 45 mm. Venous: The inferior vena cava is dilated in size with greater than 50% respiratory variability, suggesting right atrial  pressure of 8 mmHg. IAS/Shunts: The interatrial septum was not assessed.  LEFT VENTRICLE PLAX 2D LVOT diam:     2.30 cm LVOT Area:     4.15 cm  RIGHT VENTRICLE  IVC RV S prime:     6.85 cm/s  IVC diam: 2.30 cm  AORTA Ao Root diam: 4.50 cm Ao Asc diam:  3.80 cm  SHUNTS Systemic Diam: 2.30 cm Sunit Tolia Electronically signed by Olinda Bertrand Signature Date/Time: 01/15/2024/6:41:57 PM    Final    CT ABDOMEN PELVIS W CONTRAST Result Date: 01/14/2024 CLINICAL DATA:  Marvell Slider. EXAM: CT ABDOMEN AND PELVIS WITH CONTRAST TECHNIQUE: Multidetector CT imaging of the abdomen and pelvis was performed using the standard protocol following bolus administration of intravenous contrast. RADIATION DOSE REDUCTION: This exam was performed according to the departmental dose-optimization program which includes automated exposure control, adjustment of the mA and/or kV according to patient size and/or use of iterative reconstruction technique. CONTRAST:  75mL OMNIPAQUE IOHEXOL 350 MG/ML SOLN COMPARISON:  CT scan 10/02/2022 FINDINGS: Lower chest: Marked elevation of the left hemidiaphragm. The right lung is grossly clear except for dependent subpleural atelectasis/edema. No pleural effusion or pneumothorax. The heart is normal in size. Surgical changes from bypass surgery. Aortic and coronary artery calcifications. Stable pleural calcifications on the left. Hepatobiliary: No hepatic injury or perihepatic hematoma. Gallbladder is surgically absent. No common bile duct dilatation. Pancreas: Stable mild pancreatic atrophy but no mass, inflammation or ductal dilatation. No peripancreatic fluid collections. Spleen: No splenic injury or perisplenic hematoma. Small accessory spleen. Adrenals/Urinary Tract: The adrenal glands are normal. Stable numerous bilateral renal cysts not requiring any further imaging evaluation or follow-up. The bladder is unremarkable. Stomach/Bowel: The stomach, duodenum, small bowel and colon are grossly normal  without oral contrast. No inflammatory changes, mass lesions or obstructive findings. The appendix is normal. Vascular/Lymphatic: Stable age related atherosclerotic calcifications involving the aorta and branch vessels. No aneurysm or dissection. The major venous structures are patent. No mesenteric or retroperitoneal mass, adenopathy or hematoma. Reproductive: The prostate gland and seminal vesicles are unremarkable. Other: No pelvic mass or adenopathy. No free pelvic fluid collections. No inguinal mass or adenopathy. No abdominal wall hernia or subcutaneous lesions. Musculoskeletal: No acute bony findings. No acute lower rib fractures. Remote healed left rib fractures are noted. The lumbar vertebral bodies are intact. The pubic symphysis and SI joints are intact. No pelvic or hip fractures. IMPRESSION: 1. No acute abdominal/pelvic findings, mass lesions or adenopathy. 2. No acute bony findings. 3. Stable marked elevation of the left hemidiaphragm. 4. Stable vascular disease. 5. Stable bilateral renal cysts. Aortic Atherosclerosis (ICD10-I70.0). Electronically Signed   By: Marrian Siva M.D.   On: 01/14/2024 19:51   CT HEAD WO CONTRAST Result Date: 01/14/2024 CLINICAL DATA:  Marvell Slider.  Hit head. EXAM: CT HEAD WITHOUT CONTRAST CT CERVICAL SPINE WITHOUT CONTRAST TECHNIQUE: Multidetector CT imaging of the head and cervical spine was performed following the standard protocol without intravenous contrast. Multiplanar CT image reconstructions of the cervical spine were also generated. RADIATION DOSE REDUCTION: This exam was performed according to the departmental dose-optimization program which includes automated exposure control, adjustment of the mA and/or kV according to patient size and/or use of iterative reconstruction technique. COMPARISON:  CT scan 10/25/2023 FINDINGS: CT HEAD FINDINGS Brain: Stable age related cerebral atrophy, ventriculomegaly and periventricular white matter disease. No extra-axial fluid  collections are identified. No CT findings for acute hemispheric infarction or intracranial hemorrhage. No mass lesions. The brainstem and cerebellum are normal. Vascular: Stable vascular calcifications.  No hyperdense vessels. Skull: No skull fracture or bone lesions. Sinuses/Orbits: The paranasal sinuses and left mastoid air cells are clear. Stable opacification of the right mastoid air cells and fluid throughout  the middle ear cavity. Other: No scalp lesions or scalp hematoma. CT CERVICAL SPINE FINDINGS Alignment: Stable degenerative cervical spondylosis and straightening of the cervical lordosis. Skull base and vertebrae: No acute fracture. No primary bone lesion or focal pathologic process. Soft tissues and spinal canal: No prevertebral fluid or swelling. No visible canal hematoma. Disc levels: The spinal canal is generous. No significant canal stenosis. Upper chest: The lung apices are grossly clear. Other: No cervical adenopathy or hematoma. Stable carotid artery calcifications. IMPRESSION: 1. Stable age related cerebral atrophy, ventriculomegaly and periventricular white matter disease. 2. No acute intracranial findings or skull fracture. 3. Stable opacification of the right mastoid air cells and fluid throughout the middle ear cavity. 4. No cervical spine fracture or acute findings. Electronically Signed   By: Marrian Siva M.D.   On: 01/14/2024 19:44   CT CERVICAL SPINE WO CONTRAST Result Date: 01/14/2024 CLINICAL DATA:  Marvell Slider.  Hit head. EXAM: CT HEAD WITHOUT CONTRAST CT CERVICAL SPINE WITHOUT CONTRAST TECHNIQUE: Multidetector CT imaging of the head and cervical spine was performed following the standard protocol without intravenous contrast. Multiplanar CT image reconstructions of the cervical spine were also generated. RADIATION DOSE REDUCTION: This exam was performed according to the departmental dose-optimization program which includes automated exposure control, adjustment of the mA and/or kV  according to patient size and/or use of iterative reconstruction technique. COMPARISON:  CT scan 10/25/2023 FINDINGS: CT HEAD FINDINGS Brain: Stable age related cerebral atrophy, ventriculomegaly and periventricular white matter disease. No extra-axial fluid collections are identified. No CT findings for acute hemispheric infarction or intracranial hemorrhage. No mass lesions. The brainstem and cerebellum are normal. Vascular: Stable vascular calcifications.  No hyperdense vessels. Skull: No skull fracture or bone lesions. Sinuses/Orbits: The paranasal sinuses and left mastoid air cells are clear. Stable opacification of the right mastoid air cells and fluid throughout the middle ear cavity. Other: No scalp lesions or scalp hematoma. CT CERVICAL SPINE FINDINGS Alignment: Stable degenerative cervical spondylosis and straightening of the cervical lordosis. Skull base and vertebrae: No acute fracture. No primary bone lesion or focal pathologic process. Soft tissues and spinal canal: No prevertebral fluid or swelling. No visible canal hematoma. Disc levels: The spinal canal is generous. No significant canal stenosis. Upper chest: The lung apices are grossly clear. Other: No cervical adenopathy or hematoma. Stable carotid artery calcifications. IMPRESSION: 1. Stable age related cerebral atrophy, ventriculomegaly and periventricular white matter disease. 2. No acute intracranial findings or skull fracture. 3. Stable opacification of the right mastoid air cells and fluid throughout the middle ear cavity. 4. No cervical spine fracture or acute findings. Electronically Signed   By: Marrian Siva M.D.   On: 01/14/2024 19:44   DG Chest Port 1 View Result Date: 01/14/2024 CLINICAL DATA:  Marvell Slider.  Chest pain. EXAM: PORTABLE CHEST 1 VIEW COMPARISON:  Chest x-ray 10/25/2023 FINDINGS: Stable marked elevation of the left hemidiaphragm with overlying vascular crowding and atelectasis. The heart is within normal limits in size given  the AP projection and portable technique. Stable surgical changes from coronary artery bypass surgery. No acute pulmonary findings. No pleural effusions or pneumothorax. The bony thorax is intact. IMPRESSION: 1. Stable marked elevation of the left hemidiaphragm with overlying vascular crowding and atelectasis. 2. No acute pulmonary findings. Electronically Signed   By: Marrian Siva M.D.   On: 01/14/2024 19:40    Scheduled Meds:  acetaminophen   1,000 mg Oral QHS   amLODipine   5 mg Oral Daily   apixaban   2.5 mg  Oral BID   atorvastatin   40 mg Oral QHS   melatonin  10 mg Oral QHS   metoprolol  succinate  25 mg Oral QHS   Continuous Infusions:   LOS: 0 days   Time spent: 40 minutes  Unk Garb, DO  [facility]  01/16/2024, 10:05 AM

## 2024-01-16 NOTE — TOC CM/SW Note (Signed)
 Transition of Care [facility]) - Inpatient Brief Assessment   Patient Details  Name: Patrick Giles MRN: [id number] Date of Birth: [birth year]  Transition [facility]) CM/SW Contact:    [name], RN Phone Number: 01/16/2024, 10:29 AM   Clinical Narrative: From home with daughter, has PCP and insurance on file, states has no HH services in place at this time , has grab bars at home.  States family member will transport them home at [employer] and family is support system, states gets medications from [facility] on [address].  Pta self ambulatory with walker.  Patient gives this NCM permission to speak with DIL at the bedside, Patrick Giles , who also called the daughter Patrick Giles.  Per PT rec HHPT,  NCM offered choice, Patrick Giles chose [place].  NCM made referral to [facility] with [facility].  He is able to take referral.  Soc will begin 24 to 48 hrs post dc.  NCM made referral to [name] with [employer] for rolling walker.  This will be delivered to room .     Transition of Care Asessment: Insurance and Status: Insurance coverage has been reviewed Patient has primary care physician: Yes Home environment has been reviewed: home with daughter , Patrick Giles Prior level of function:: ambulatory with walker Prior/Current Home Services: Current home services (grab bars, has old walker that was his wife's) Social Drivers of Health Review: SDOH reviewed no interventions necessary Readmission risk has been reviewed: Yes Transition of care needs: transition of care needs identified, TOC will continue to follow

## 2024-01-16 NOTE — TOC Transition Note (Signed)
 Transition of Care [facility]) - Discharge Note   Patient Details  Name: Patrick Giles MRN: [id number] Date of Birth: 30-Nov-1932  [facility] CM/SW Contact:  [name], RN Phone Number: 01/16/2024, 10:31 AM   Clinical Narrative:    For dc today, patient's daughter , Patrick Giles is on the way to transport him  home.  He is set up with [employer] and [employer] to supply the rolling walker.          Patient Goals and CMS Choice            Discharge Placement                       Discharge Plan and Services Additional resources added to the After Visit Summary for                                       Social Drivers of Health (SDOH) Interventions SDOH Screenings   Food Insecurity: No Food Insecurity (01/15/2024)  Housing: Low Risk  (01/15/2024)  Transportation Needs: No Transportation Needs (01/15/2024)  Utilities: Not At Risk (01/15/2024)  Financial Resource Strain: Low Risk  (10/26/2023)   [facility] from [facility]  Physical Activity: Sufficiently Active (11/11/2022)   Received from [facility]  Social Connections: Socially Isolated (01/15/2024)  Stress: No Stress Concern Present (11/11/2022)   Received from [facility]  Tobacco Use: Medium Risk (01/15/2024)     Readmission Risk Interventions     No data to display

## 2024-01-16 NOTE — Discharge Summary (Signed)
 [facility] Physician Discharge Summary   Patient name: Patrick Giles  Admit date:     01/14/2024  Discharge date: 01/16/2024  Attending Physician: DOUTOVA, ANASTASSIA [[id number]]  Discharge Physician: Unk Garb   PCP: Emaline Handsome, MD  Admitted From: Home Disposition:  Home  Recommendations for Outpatient Follow-up:  Follow up with PCP in 1-2 weeks Follow up with PCP for suture removal If LVEF assessment is desired, recommend referral to cardiology to decide on best modality. Echo is not a preferred modality for him due to poor echo windows despite Definity  contrast.  Home Health:Yes. Home PT Equipment/Devices: None  Discharge Condition:Stable CODE STATUS:FULL Diet recommendation: Heart Healthy Fluid Restriction: None  Hospital Summary: HPI: Patrick Giles is a 89 y.o. male with medical history significant of HLD CKD A-fib on Eliquis  thrombocytopenia hypertension.     Presented with an unwitnessed fall at home he is on Eliquis  for history of A-fib.  Found to have hematoma of right side of her head per EMS. At baseline has right-sided weakness and aphasia. Patient unsure how he fell. He was walking with a walker when this happened.  He lives alone at home but daughter checks on him frequently.   Patient has been admitted in January for ileus versus small bowel obstruction was managed nonoperatively and patient was able to be discharged home   He was siting watching TV, family thinks he got up was trying to get a remote and the next thing he was on the floor   No chest pain , no shortness of breath.  On a good day he is able to walk his dog   Significant Events: Admitted 01/14/2024 for fall. Possible syncope   Significant Labs: WBC 8.8, HgB 14.9, plt 155 Na 138, K 4.0, CO2 of 23, BUN 19, Scr 1.85, glu 121 UA negative  Significant Imaging Studies: CXR Stable marked elevation of the left hemidiaphragm with overlying vascular crowding and atelectasis. 2. No acute  pulmonary findings CT head, c-spine Stable age related cerebral atrophy, ventriculomegaly and periventricular white matter disease. 2. No acute intracranial findings or skull fracture. 3. Stable opacification of the right mastoid air cells and fluid throughout the middle ear cavity. 4. No cervical spine fracture or acute findings CT abd/pelvis No acute abdominal/pelvic findings, mass lesions or adenopathy. 2. No acute bony findings. 3. Stable marked elevation of the left hemidiaphragm. 4. Stable vascular disease. 5. Stable bilateral renal cysts.   Antibiotic Therapy: Anti-infectives (From admission, onward)    None       Procedures:   Consultants:    Hospital Course by Problem: * Fall at home, initial encounter 01-15-2024 no injury. Has some lacerations that were repaired. Awaiting PT consult and echo. Plan to monitor overnight and DC in AM. Stop IVF.  01-16-2024 pt able to work with PT yesterday. RW ordered for him. Home health PT ordered for him per family request.  History of CVA (cerebrovascular accident) 01-15-2024 chronic.  01-16-2024 stable.  Elevated troponin 01-15-2024 flat. BP was elevated on arrival. Probably some demand mismatch. Not a NSTEMI.  01-16-2024 stable.  Chronic a-fib (HCC) 01-15-2024 continue eliquis . Pt on Toprol -XL for rate controlling meds.  01-16-2024 stable.  History of DVT (deep vein thrombosis) 01-15-2024 on Eliquis .  01-16-2024 stable.  Essential hypertension 01-15-2024 stable on Toprol -XL and norvasc .  01-16-2024 stable.  CKD stage 3b, GFR 30-44 ml/min (HCC) 01-15-2024 baseline scr 1.8-2.0  01-16-2024 stable.  Dyslipidemia 01-15-2024  on lipitor .  01-16-2024 stable.  Cardiomyopathy, ischemic 01-15-2024  stable. Check echo.  01-16-2024 echo with poor windows.  CAD (coronary artery disease) 01-15-2024 stable. On Toprol -XL, lipitor   01-16-2024 stable.    Discharge Diagnoses:  Principal Problem:   Fall at home,  initial encounter Active Problems:   CAD (coronary artery disease)   Cardiomyopathy, ischemic   Dyslipidemia   CKD stage 3b, GFR 30-44 ml/min (HCC)   Essential hypertension   History of DVT (deep vein thrombosis)   Chronic a-fib (HCC)   Elevated troponin   History of CVA (cerebrovascular accident)   Discharge Instructions  Discharge Instructions     Call MD for:  extreme fatigue   Complete by: As directed    Call MD for:  persistant dizziness or light-headedness   Complete by: As directed    Call MD for:  persistant nausea and vomiting   Complete by: As directed    Call MD for:  severe uncontrolled pain   Complete by: As directed    Call MD for:  temperature >100.4   Complete by: As directed    Diet - low sodium heart healthy   Complete by: As directed    Discharge instructions   Complete by: As directed    1. Follow up with your primary care provider in 1-2 weeks following discharge from hospital. 2. Have sutures removed in PCP office in 1 week.   Increase activity slowly   Complete by: As directed    No wound care   Complete by: As directed       Allergies as of 01/16/2024   No Known Allergies      Medication List     TAKE these medications    ALIVE MENS ENERGY PO Take 1 tablet by mouth daily with breakfast.   amLODipine  5 MG tablet Commonly known as: NORVASC  Take 1 tablet (5 mg total) by mouth daily.   atorvastatin  40 MG tablet Commonly known as: LIPITOR  TAKE 1 TABLET BY MOUTH EVERY DAY AT 6PM What changed: See the new instructions.   Eliquis  2.5 MG Tabs tablet Generic drug: apixaban  TAKE 1 TABLET BY MOUTH TWICE A DAY What changed: how much to take   Fish Oil 1000 MG Caps Take 1,000 mg by mouth in the morning.   Garlic 1000 MG Caps Take 1,000 mg by mouth daily.   HYDROcodone -acetaminophen  5-325 MG tablet Commonly known as: NORCO/VICODIN Take 1 tablet by mouth every 6 (six) hours as needed for up to 5 days for moderate pain (pain score  4-6).   Melatonin 10 MG Tabs Take 10 mg by mouth at bedtime.   metoprolol  succinate 25 MG 24 hr tablet Commonly known as: TOPROL -XL Take 25 mg by mouth at bedtime.        No Known Allergies  Discharge Exam: Vitals:   01/16/24 0411 01/16/24 0910  BP: 127/70 139/67  Pulse: 65 62  Resp: 18 18  Temp: 97.9 F (36.6 C) 97.6 F (36.4 C)  SpO2: 97% 97%    Physical Exam Vitals and nursing note reviewed.  Constitutional:      General: He is not in acute distress.    Appearance: He is not toxic-appearing or diaphoretic.  HENT:     Head: Normocephalic and atraumatic.  Cardiovascular:     Rate and Rhythm: Normal rate. Rhythm irregular.  Pulmonary:     Effort: Pulmonary effort is normal. No respiratory distress.     Breath sounds: Normal breath sounds. No wheezing.  Abdominal:     General: Abdomen is protuberant. Bowel sounds  are normal. There is no distension.     Palpations: Abdomen is soft.  Skin:    General: Skin is warm and dry.     Capillary Refill: Capillary refill takes less than 2 seconds.  Neurological:     Mental Status: He is alert and oriented to person, place, and time.     The results of significant diagnostics from this hospitalization (including imaging, microbiology, ancillary and laboratory) are listed below for reference.     Labs:  Basic Metabolic Panel: Recent Labs  Lab 01/14/24 1712 01/14/24 1723 01/15/24 0747  NA 138 141 139  K 4.0 4.0 4.3  CL 104 105 107  CO2 23  --  23  GLUCOSE 121* 116* 125*  BUN 19 23 21   CREATININE 1.85* 2.00* 1.85*  CALCIUM  9.7  --  9.1  MG  --   --  1.7  PHOS  --   --  2.9   Liver Function Tests: Recent Labs  Lab 01/14/24 1712 01/15/24 0747  AST 22 21  ALT 17 14  ALKPHOS 59 47  BILITOT 0.8 0.9  PROT 7.0 5.8*  ALBUMIN 4.0 3.1*   CBC: Recent Labs  Lab 01/14/24 1712 01/14/24 1723 01/15/24 0747  WBC 8.8  --  9.4  HGB 14.9 15.6 12.8*  HCT 46.1 46.0 38.2*  MCV 95.8  --  94.1  PLT 155  --  142*     Urinalysis    Component Value Date/Time   COLORURINE YELLOW 01/14/2024 1829   APPEARANCEUR CLEAR 01/14/2024 1829   LABSPEC 1.011 01/14/2024 1829   PHURINE 6.0 01/14/2024 1829   GLUCOSEU NEGATIVE 01/14/2024 1829   HGBUR NEGATIVE 01/14/2024 1829   BILIRUBINUR NEGATIVE 01/14/2024 1829   KETONESUR NEGATIVE 01/14/2024 1829   PROTEINUR 30 (A) 01/14/2024 1829   NITRITE NEGATIVE 01/14/2024 1829   LEUKOCYTESUR NEGATIVE 01/14/2024 1829   Sepsis Labs Recent Labs  Lab 01/14/24 1712 01/15/24 0747  WBC 8.8 9.4    Procedures/Studies: ECHOCARDIOGRAM COMPLETE Result Date: 01/15/2024    ECHOCARDIOGRAM REPORT   Patient Name:   Patrick Giles Date of Exam: 01/15/2024 Medical Rec #:  [id number]     Height:       63.0 in Accession #:    [id number]    Weight:       194.7 lb Date of Birth:  [birth year]     BSA:          1.912 m Patient Age:    89 years      BP:           127/86 mmHg Patient Gender: M             HR:           74 bpm. Exam Location:  Inpatient Procedure: 2D Echo (Both Spectral and Color Flow Doppler were utilized during            procedure). Indications:    syncope  History:        Patient has prior history of Echocardiogram examinations, most                 recent 06/18/2018. Cardiomyopathy, CAD, Prior CABG, chronic                 kidney disease; Risk Factors:Hypertension and Dyslipidemia.  Sonographer:    Dione Franks RDCS Referring Phys: [id number] ANASTASSIA DOUTOVA  Sonographer Comments: Technically difficult study due to poor echo windows, Technically challenging study due to limited acoustic windows, suboptimal  parasternal window and suboptimal apical window. Image acquisition challenging due to respiratory motion. IMPRESSIONS  1. Technically difficult study.  2. Extremely poor study quality despite utilization of Definity . Accurate assessment of left ventricular ejection function is not possible. Recommend different modality to re-evaluate LVEF. Left ventricular endocardial border not  optimally defined to evaluate regional wall motion.  3. Right ventricular systolic function was not well visualized. The right ventricular size is not well visualized.  4. Left atrial size was grossly normal.  5. Pericardial effusion grossly not present.  6. The mitral valve was not well visualized.  7. The aortic valve was not well visualized. Aortic valve regurgitation is not visualized.  8. Aortic dilatation noted. There is dilatation of the aortic root, measuring 45 mm.  9. The inferior vena cava is dilated in size with >50% respiratory variability, suggesting right atrial pressure of 8 mmHg. Comparison(s): A prior study was performed on 01/07/2016. Images reviewed - mildly reduced LVEF with global hyopkinesis and no significant valvular heart disease. Conclusion(s)/Recommendation(s): Consider different modality such as TEE or MRI if clinically indicated. Consider CTA Aorta protocol to evaluate the aortic root and proximal ascending aorta. FINDINGS  Left Ventricle: Extremely poor study quality despite utilization of Definity . Accurate assessment of left ventricular ejection function is not possible. Recommend different modality to re-evaluate LVEF. Left ventricular endocardial border not optimally defined to evaluate regional wall motion. Definity  contrast agent was given IV to delineate the left ventricular endocardial borders. Right Ventricle: The right ventricular size is not well visualized. Right vetricular wall thickness was not assessed. Right ventricular systolic function was not well visualized. Left Atrium: Left atrial size was grossly normal. Right Atrium: Right atrial size was not well visualized. Pericardium: Pericardial effusion grossly not present. Mitral Valve: The mitral valve was not well visualized. Tricuspid Valve: The tricuspid valve is not well visualized. Tricuspid valve regurgitation is not demonstrated. Aortic Valve: The aortic valve was not well visualized. Aortic valve regurgitation is  not visualized. Pulmonic Valve: The pulmonic valve was grossly normal. Pulmonic valve regurgitation is not visualized. Aorta: Aortic dilatation noted. There is dilatation of the aortic root, measuring 45 mm. Venous: The inferior vena cava is dilated in size with greater than 50% respiratory variability, suggesting right atrial pressure of 8 mmHg. IAS/Shunts: The interatrial septum was not assessed.  LEFT VENTRICLE PLAX 2D LVOT diam:     2.30 cm LVOT Area:     4.15 cm  RIGHT VENTRICLE            IVC RV S prime:     6.85 cm/s  IVC diam: 2.30 cm  AORTA Ao Root diam: 4.50 cm Ao Asc diam:  3.80 cm  SHUNTS Systemic Diam: 2.30 cm Sunit Tolia Electronically signed by Olinda Bertrand Signature Date/Time: 01/15/2024/6:41:57 PM    Final    CT ABDOMEN PELVIS W CONTRAST Result Date: 01/14/2024 CLINICAL DATA:  Patrick Giles. EXAM: CT ABDOMEN AND PELVIS WITH CONTRAST TECHNIQUE: Multidetector CT imaging of the abdomen and pelvis was performed using the standard protocol following bolus administration of intravenous contrast. RADIATION DOSE REDUCTION: This exam was performed according to the departmental dose-optimization program which includes automated exposure control, adjustment of the mA and/or kV according to patient size and/or use of iterative reconstruction technique. CONTRAST:  75mL OMNIPAQUE IOHEXOL 350 MG/ML SOLN COMPARISON:  CT scan 10/02/2022 FINDINGS: Lower chest: Marked elevation of the left hemidiaphragm. The right lung is grossly clear except for dependent subpleural atelectasis/edema. No pleural effusion or pneumothorax. The heart is normal  in size. Surgical changes from bypass surgery. Aortic and coronary artery calcifications. Stable pleural calcifications on the left. Hepatobiliary: No hepatic injury or perihepatic hematoma. Gallbladder is surgically absent. No common bile duct dilatation. Pancreas: Stable mild pancreatic atrophy but no mass, inflammation or ductal dilatation. No peripancreatic fluid collections. Spleen:  No splenic injury or perisplenic hematoma. Small accessory spleen. Adrenals/Urinary Tract: The adrenal glands are normal. Stable numerous bilateral renal cysts not requiring any further imaging evaluation or follow-up. The bladder is unremarkable. Stomach/Bowel: The stomach, duodenum, small bowel and colon are grossly normal without oral contrast. No inflammatory changes, mass lesions or obstructive findings. The appendix is normal. Vascular/Lymphatic: Stable age related atherosclerotic calcifications involving the aorta and branch vessels. No aneurysm or dissection. The major venous structures are patent. No mesenteric or retroperitoneal mass, adenopathy or hematoma. Reproductive: The prostate gland and seminal vesicles are unremarkable. Other: No pelvic mass or adenopathy. No free pelvic fluid collections. No inguinal mass or adenopathy. No abdominal wall hernia or subcutaneous lesions. Musculoskeletal: No acute bony findings. No acute lower rib fractures. Remote healed left rib fractures are noted. The lumbar vertebral bodies are intact. The pubic symphysis and SI joints are intact. No pelvic or hip fractures. IMPRESSION: 1. No acute abdominal/pelvic findings, mass lesions or adenopathy. 2. No acute bony findings. 3. Stable marked elevation of the left hemidiaphragm. 4. Stable vascular disease. 5. Stable bilateral renal cysts. Aortic Atherosclerosis (ICD10-I70.0). Electronically Signed   By: Marrian Siva M.D.   On: 01/14/2024 19:51   CT HEAD WO CONTRAST Result Date: 01/14/2024 CLINICAL DATA:  Patrick Giles.  Hit head. EXAM: CT HEAD WITHOUT CONTRAST CT CERVICAL SPINE WITHOUT CONTRAST TECHNIQUE: Multidetector CT imaging of the head and cervical spine was performed following the standard protocol without intravenous contrast. Multiplanar CT image reconstructions of the cervical spine were also generated. RADIATION DOSE REDUCTION: This exam was performed according to the departmental dose-optimization program which  includes automated exposure control, adjustment of the mA and/or kV according to patient size and/or use of iterative reconstruction technique. COMPARISON:  CT scan 10/25/2023 FINDINGS: CT HEAD FINDINGS Brain: Stable age related cerebral atrophy, ventriculomegaly and periventricular white matter disease. No extra-axial fluid collections are identified. No CT findings for acute hemispheric infarction or intracranial hemorrhage. No mass lesions. The brainstem and cerebellum are normal. Vascular: Stable vascular calcifications.  No hyperdense vessels. Skull: No skull fracture or bone lesions. Sinuses/Orbits: The paranasal sinuses and left mastoid air cells are clear. Stable opacification of the right mastoid air cells and fluid throughout the middle ear cavity. Other: No scalp lesions or scalp hematoma. CT CERVICAL SPINE FINDINGS Alignment: Stable degenerative cervical spondylosis and straightening of the cervical lordosis. Skull base and vertebrae: No acute fracture. No primary bone lesion or focal pathologic process. Soft tissues and spinal canal: No prevertebral fluid or swelling. No visible canal hematoma. Disc levels: The spinal canal is generous. No significant canal stenosis. Upper chest: The lung apices are grossly clear. Other: No cervical adenopathy or hematoma. Stable carotid artery calcifications. IMPRESSION: 1. Stable age related cerebral atrophy, ventriculomegaly and periventricular white matter disease. 2. No acute intracranial findings or skull fracture. 3. Stable opacification of the right mastoid air cells and fluid throughout the middle ear cavity. 4. No cervical spine fracture or acute findings. Electronically Signed   By: Marrian Siva M.D.   On: 01/14/2024 19:44   CT CERVICAL SPINE WO CONTRAST Result Date: 01/14/2024 CLINICAL DATA:  Patrick Giles.  Hit head. EXAM: CT HEAD WITHOUT CONTRAST CT CERVICAL SPINE  WITHOUT CONTRAST TECHNIQUE: Multidetector CT imaging of the head and cervical spine was performed  following the standard protocol without intravenous contrast. Multiplanar CT image reconstructions of the cervical spine were also generated. RADIATION DOSE REDUCTION: This exam was performed according to the departmental dose-optimization program which includes automated exposure control, adjustment of the mA and/or kV according to patient size and/or use of iterative reconstruction technique. COMPARISON:  CT scan 10/25/2023 FINDINGS: CT HEAD FINDINGS Brain: Stable age related cerebral atrophy, ventriculomegaly and periventricular white matter disease. No extra-axial fluid collections are identified. No CT findings for acute hemispheric infarction or intracranial hemorrhage. No mass lesions. The brainstem and cerebellum are normal. Vascular: Stable vascular calcifications.  No hyperdense vessels. Skull: No skull fracture or bone lesions. Sinuses/Orbits: The paranasal sinuses and left mastoid air cells are clear. Stable opacification of the right mastoid air cells and fluid throughout the middle ear cavity. Other: No scalp lesions or scalp hematoma. CT CERVICAL SPINE FINDINGS Alignment: Stable degenerative cervical spondylosis and straightening of the cervical lordosis. Skull base and vertebrae: No acute fracture. No primary bone lesion or focal pathologic process. Soft tissues and spinal canal: No prevertebral fluid or swelling. No visible canal hematoma. Disc levels: The spinal canal is generous. No significant canal stenosis. Upper chest: The lung apices are grossly clear. Other: No cervical adenopathy or hematoma. Stable carotid artery calcifications. IMPRESSION: 1. Stable age related cerebral atrophy, ventriculomegaly and periventricular white matter disease. 2. No acute intracranial findings or skull fracture. 3. Stable opacification of the right mastoid air cells and fluid throughout the middle ear cavity. 4. No cervical spine fracture or acute findings. Electronically Signed   By: Marrian Siva M.D.   On:  01/14/2024 19:44   DG Chest Port 1 View Result Date: 01/14/2024 CLINICAL DATA:  Patrick Giles.  Chest pain. EXAM: PORTABLE CHEST 1 VIEW COMPARISON:  Chest x-ray 10/25/2023 FINDINGS: Stable marked elevation of the left hemidiaphragm with overlying vascular crowding and atelectasis. The heart is within normal limits in size given the AP projection and portable technique. Stable surgical changes from coronary artery bypass surgery. No acute pulmonary findings. No pleural effusions or pneumothorax. The bony thorax is intact. IMPRESSION: 1. Stable marked elevation of the left hemidiaphragm with overlying vascular crowding and atelectasis. 2. No acute pulmonary findings. Electronically Signed   By: Marrian Siva M.D.   On: 01/14/2024 19:40    Time coordinating discharge: 50 mins  SIGNED:  Unk Garb, DO [facility] 01/16/24, 10:07 AM

## 2024-01-16 NOTE — Plan of Care (Signed)
  Problem: Education: Goal: Knowledge of General Education information will improve Description: Including pain rating scale, medication(s)/side effects and non-pharmacologic comfort measures Outcome: Progressing   Problem: Clinical Measurements: Goal: Will remain free from infection Outcome: Progressing   Problem: Clinical Measurements: Goal: Diagnostic test results will improve Outcome: Progressing   Problem: Clinical Measurements: Goal: Cardiovascular complication will be avoided Outcome: Progressing

## 2024-01-19 ENCOUNTER — Telehealth: Payer: Self-pay | Admitting: Internal Medicine

## 2024-01-19 NOTE — Telephone Encounter (Signed)
 Returned call to [employer] PT.Stated he was calling to report patient's B/P.Readings listed below. Spoke to patient's daughter Patrick Giles she stated she is with her father now and he is doing good.No complaints.Advised to continue medications as prescribed.Keep appointment scheduled with Callie Goodrich PA 5/5 at 3:35 pm.Advised to bring B/P readings and a list of all medications to appointment.

## 2024-01-19 NOTE — Telephone Encounter (Signed)
.  SYNCOPECHMG   Pt c/o Syncope: STAT if syncope occurred within 24 hours and pt complains of lightheadedness.   High Priority if episode of passing out, completely, today or in last 24 hours   1. Did you pass out today? no   2. When is the last time you passed out? Last several months has passed out   3. Has this occurred multiple times? yes  4. Did you have any symptoms prior to passing out? No   5. Did you fall? If so, are you on a blood thinner? No sure, Yes Pt was seen at [facility]  d/c 4/29.  BP reading Sitting 134/84 Standing 184/98

## 2024-01-21 NOTE — Progress Notes (Unsigned)
 Cardiology Office Note:    Date:  01/22/2024   ID:  Patrick Giles, DOB 1933/07/18, MRN [id number]  PCP:  Emaline Handsome, MD    [facility] Providers Cardiologist:  Hazle Lites, MD     Referring MD: Emaline Handsome, MD   Chief Complaint  Patient presents with   Follow-up    Falls    History of Present Illness:    Patrick Giles is a 89 y.o. male with a hx of CAD s/p remote CABG x 3 (LIMA-LAD, SVG-diag, SVG-PDA) in 1998, ischemic cardiomyopathy with LVEF 40-45% on echo 2017, PAF on eliquis , ascending thoracic arotic aneurysm (stable at 4.3 cm on chest CT 09/2022), CVA in 2019, HTN, HLD, and CKD stage IIIb. Nuclear stress test 2006 with fixed inferoapical defect, stable on stress test 2010 - felt possibly felt that he lost a vein graft to the PDA. OP monitor 9/20202 with PVCs and > 1700 narrow complex tachycardic episodes with no clear p waves suspicious for atrial fibrillation - started on eliquis .   Last seen in clinic 03/2023.   He was recently admitted 12/2023 after fall at home. Unclear syncope.   He presents today for routine cardiology follow up. His daughter accompanies him who is a physical therapist.  He has had a decline in function, she states that 1 year ago he was walking his dog in the park.  He has since had 3 falls in the past 6 months.  Thankfully he has not sustained serious injury/brain bleed.  He remains on Eliquis .  He fell again this morning causing an abrasion on his left elbow.  He is started working with physical therapy and I agree with this.  He experiences shortness of breath while working with physical therapy.  He does not have lower extremity swelling and denies orthopnea.  He does not think that he is volume overloaded.  Unfortunately echocardiogram during his recent admission could not evaluate LVEF. I do not have a BNP.    Past Medical History:  Diagnosis Date   CAD (coronary artery disease)    DVT, lower extremity (HCC) 07/23/2014    Dyslipidemia    Hypertension    NSVT (nonsustained ventricular tachycardia) (HCC) 01/14/2024   S/P CABG (coronary artery bypass graft) 1998   LIMA to LAD, VG to PDA, VG to first diagonal   Stroke [facility])     Past Surgical History:  Procedure Laterality Date   CHOLECYSTECTOMY  1998   CORONARY ARTERY BYPASS GRAFT  07/08/1997   LIMA to LAD, reverse SVG to PDA, reverse SVG to first diagonal (Dr. Mardell Shade)   TRANSTHORACIC ECHOCARDIOGRAM  11/29/2012   EF 45-50%, grade 1 diastolic dysfunction    Current Medications: Current Meds  Medication Sig   apixaban  (ELIQUIS ) 2.5 MG TABS tablet TAKE 1 TABLET BY MOUTH TWICE A DAY (Patient taking differently: Take 2.5 mg by mouth 2 (two) times daily.)   atorvastatin  (LIPITOR ) 40 MG tablet TAKE 1 TABLET BY MOUTH EVERY DAY AT 6PM (Patient taking differently: Take 40 mg by mouth at bedtime.)   Garlic 1000 MG CAPS Take 1,000 mg by mouth daily.   metoprolol  succinate (TOPROL -XL) 25 MG 24 hr tablet Take 25 mg by mouth at bedtime.   Multiple Vitamins-Minerals (ALIVE MENS ENERGY PO) Take 1 tablet by mouth daily with breakfast.   Omega-3 Fatty Acids (FISH OIL) 1000 MG CAPS Take 1,000 mg by mouth in the morning.     Allergies:   Patient has no known  allergies.   Social History   Socioeconomic History   Marital status: Widowed    Spouse name: Not on file   Number of children: 5   Years of education: 80   Highest education level: Not on file  Occupational History   Occupation: Retired  Tobacco Use   Smoking status: Former    Types: Cigars    Quit date: 09/19/1988    Years since quitting: 35.3   Smokeless tobacco: Never  Vaping Use   Vaping status: Never Used  Substance and Sexual Activity   Alcohol use: No    Alcohol/week: 0.0 standard drinks of alcohol   Drug use: No   Sexual activity: Not on file  Other Topics Concern   Not on file  Social History Narrative   Not on file   Social Drivers of Health   Financial Resource Strain: Low Risk   (10/26/2023)   Received from [facility]   Overall Financial Resource Strain (CARDIA)    Difficulty of Paying Living Expenses: Not hard at all  Food Insecurity: No Food Insecurity (01/15/2024)   Hunger Vital Sign    Worried About Running Out of Food in the Last Year: Never true    Ran Out of Food in the Last Year: Never true  Transportation Needs: No Transportation Needs (01/15/2024)   PRAPARE - Administrator, Civil Service (Medical): No    Lack of Transportation (Non-Medical): No  Physical Activity: Sufficiently Active (11/11/2022)   Received from [facility]   Exercise Vital Sign    Days of Exercise per Week: 7 days    Minutes of Exercise per Session: 30 min  Stress: No Stress Concern Present (11/11/2022)   Received from [facility] - Occupational Stress Questionnaire    Feeling of Stress : Not at all  Social Connections: Socially Isolated (01/15/2024)   Social Connection and Isolation Panel [NHANES]    Frequency of Communication with Friends and Family: More than three times a week    Frequency of Social Gatherings with Friends and Family: More than three times a week    Attends Religious Services: Never    Database administrator or Organizations: No    Attends Banker Meetings: Never    Marital Status: Widowed     Family History: The patient's family history includes Colon cancer in his father; Colon polyps in his father; Emphysema in his mother; Lung cancer in his brother. There is no history of Diabetes, Kidney disease, or Esophageal cancer.  ROS:   Please see the history of present illness.     All other systems reviewed and are negative.  EKGs/Labs/Other Studies Reviewed:    The following studies were reviewed today:       Recent Labs: 01/15/2024: ALT 14; BUN 21; Creatinine, Ser 1.85; Hemoglobin 12.8; Magnesium  1.7; Platelets 142; Potassium 4.3; Sodium 139  Recent  Lipid Panel    Component Value Date/Time   CHOL 135 06/19/2018 0437   CHOL 147 01/18/2018 0932   TRIG 115 06/19/2018 0437   HDL 44 06/19/2018 0437   HDL 54 01/18/2018 0932   CHOLHDL 3.1 06/19/2018 0437   VLDL 23 06/19/2018 0437   LDLCALC 68 06/19/2018 0437   LDLCALC 63 01/18/2018 0932     Risk Assessment/Calculations:    CHA2DS2-VASc Score = 5   This indicates a 7.2% annual risk of stroke. The patient's score is based upon: CHF History: 1  HTN History: 1 Diabetes History: 0 Stroke History: 0 Vascular Disease History: 1 Age Score: 2 Gender Score: 0             Physical Exam:    VS:  BP 129/81 (BP Location: Left Arm, Patient Position: Sitting, Cuff Size: Normal)   Pulse 70   Wt 197 lb (89.4 kg)   SpO2 94%   BMI 34.90 kg/m     Wt Readings from Last 3 Encounters:  01/22/24 197 lb (89.4 kg)  01/16/24 (P) 197 lb 1.5 oz (89.4 kg)  03/27/23 209 lb 12.8 oz (95.2 kg)     GEN:  Well nourished, well developed in no acute distress HEENT: Normal NECK: No JVD; No carotid bruits LYMPHATICS: No lymphadenopathy CARDIAC: RRR, no murmurs, rubs, gallops RESPIRATORY:  diminished on left due to hemidiaphragm  ABDOMEN: Soft, non-tender, non-distended MUSCULOSKELETAL:  No edema; No deformity  SKIN: Warm and dry NEUROLOGIC:  Alert and oriented x 3 PSYCHIATRIC:  Normal affect   ASSESSMENT:    1. Coronary artery disease involving native coronary artery of native heart without angina pectoris   2. S/P CABG (coronary artery bypass graft)   3. Primary hypertension   4. Hyperlipidemia with target LDL less than 70   5. Stage 3b chronic kidney disease (HCC)   6. Cardiomyopathy, ischemic   7. Chronic anticoagulation   8. Persistent atrial fibrillation (HCC)    PLAN:    In order of problems listed above:  CAD s/p CABG x 3 Fixed defect on nuclear stress test - may have SVG-PDA down per stress test fixed defect - no ASA given eliquis  - No chest pain - Amlodipine  has been  discontinued due to borderline blood pressure in the hospital, agree   Ischemic cardiomyopathy Chronic systolic heart failure - LVEF could not accurately be assessed during his hospitalization - historically was 40-45% in 2017 and "normal" in 2019 - sCr 1.85 - He does not appear volume up on exam today, CXR with known left hemidiaphragm - He denies orthopnea, does not have lower extremity swelling   Persistent atrial fibrillation Chronic anticoagulation - continue eliquis  2.5 mg BID for age and renal function - continue 25 mg toprol    Hypertension - no longer on amlodipine  after initial fall last weekend   Hyperlipidemia with LDL goal < 70 No recent lipid panel, do not feel strongly about rechecking this Continue statin   At this juncture, he is not clinically volume up.  I am uncertain what is causing his recent falls.  I agree with physical therapy.  I suspect his dyspnea on exertion is due to deconditioning.  I will not prescribe diuretic at this time as I do not want her risk dropping his blood pressure.  I think we should allow him to recover for 1 month.  If his shortness of breath worsens or he becomes orthopneic, he will likely require torsemide given renal function.  He is due to see his PCP next week.  Only if labs are drawn, we request a BNP.  Follow-up in 1 month.  If symptoms not improved, will consider repeat limited echo and BNP at that time.  I also mentioned that with his falls, we may need to discuss discontinuation of Eliquis  in the future.  I think this will need to be a patient and family decision.     Medication Adjustments/Labs and Tests Ordered: Current medicines are reviewed at length with the patient today.  Concerns regarding medicines are outlined above.  No  orders of the defined types were placed in this encounter.  No orders of the defined types were placed in this encounter.   Patient Instructions  Medication Instructions:  DISCONTINUED  AMLODIPINE  *If you need a refill on your cardiac medications before your next appointment, please call your pharmacy*  Lab Work: NO LABS If you have labs (blood work) drawn today and your tests are completely normal, you will receive your results only by: MyChart Message (if you have MyChart) OR A paper copy in the mail If you have any lab test that is abnormal or we need to change your treatment, we will call you to review the results.  Testing/Procedures: NO TESTING  Follow-Up: At [facility], you and your health needs are our priority.  As part of our continuing mission to provide you with exceptional heart care, our providers are all part of one team.  This team includes your primary Cardiologist (physician) and Advanced Practice Providers or APPs (Physician Assistants and Nurse Practitioners) who all work together to provide you with the care you need, when you need it.  Your next appointment:   1 month(s)  Provider:   Marcie Sever, PA   Signed, Warren Haber Chamara Dyck, [place]  01/22/2024 4:54 PM    [facility]

## 2024-01-22 ENCOUNTER — Encounter: Payer: Self-pay | Admitting: Physician Assistant

## 2024-01-22 ENCOUNTER — Ambulatory Visit: Attending: Physician Assistant | Admitting: Physician Assistant

## 2024-01-22 VITALS — BP 129/81 | HR 70 | Wt 197.0 lb

## 2024-01-22 DIAGNOSIS — Z951 Presence of aortocoronary bypass graft: Secondary | ICD-10-CM | POA: Diagnosis not present

## 2024-01-22 DIAGNOSIS — I4819 Other persistent atrial fibrillation: Secondary | ICD-10-CM

## 2024-01-22 DIAGNOSIS — I2510 Atherosclerotic heart disease of native coronary artery without angina pectoris: Secondary | ICD-10-CM

## 2024-01-22 DIAGNOSIS — Z7901 Long term (current) use of anticoagulants: Secondary | ICD-10-CM

## 2024-01-22 DIAGNOSIS — I255 Ischemic cardiomyopathy: Secondary | ICD-10-CM

## 2024-01-22 DIAGNOSIS — I10 Essential (primary) hypertension: Secondary | ICD-10-CM

## 2024-01-22 DIAGNOSIS — N1832 Chronic kidney disease, stage 3b: Secondary | ICD-10-CM

## 2024-01-22 DIAGNOSIS — E785 Hyperlipidemia, unspecified: Secondary | ICD-10-CM | POA: Diagnosis not present

## 2024-01-22 NOTE — Patient Instructions (Signed)
 Medication Instructions:  DISCONTINUED AMLODIPINE  *If you need a refill on your cardiac medications before your next appointment, please call your pharmacy*  Lab Work: NO LABS If you have labs (blood work) drawn today and your tests are completely normal, you will receive your results only by: MyChart Message (if you have MyChart) OR A paper copy in the mail If you have any lab test that is abnormal or we need to change your treatment, we will call you to review the results.  Testing/Procedures: NO TESTING  Follow-Up: At [facility], you and your health needs are our priority.  As part of our continuing mission to provide you with exceptional heart care, our providers are all part of one team.  This team includes your primary Cardiologist (physician) and Advanced Practice Providers or APPs (Physician Assistants and Nurse Practitioners) who all work together to provide you with the care you need, when you need it.  Your next appointment:   1 month(s)  Provider:   Marcie Sever, PA

## 2024-01-25 ENCOUNTER — Telehealth: Payer: Self-pay | Admitting: Internal Medicine

## 2024-01-25 NOTE — Telephone Encounter (Signed)
 Physical Therapist Josiah Nigh would like verify if the patient has been diagnosed with Heart Failure. Please advise.

## 2024-01-26 ENCOUNTER — Telehealth: Payer: Self-pay | Admitting: Internal Medicine

## 2024-01-26 NOTE — Telephone Encounter (Signed)
 Spoke with Art Bigness. Limited information given as she is not on DPR. Will forward to provider for review for sooner appointment. Art Bigness understands she will need to be put on DPR to obtain medical information.

## 2024-01-26 NOTE — Telephone Encounter (Signed)
 Daughter says patient was seen by [place] today and their provider had concerns with patient wheezing, but lungs are clear. Daughter says [facility] provider suggested following up sooner than 1 month, 6/16 with Marcie Sever. Please advise.

## 2024-01-30 ENCOUNTER — Encounter: Payer: Self-pay | Admitting: Internal Medicine

## 2024-02-10 ENCOUNTER — Encounter: Payer: Self-pay | Admitting: Internal Medicine

## 2024-02-19 NOTE — Telephone Encounter (Signed)
 Patient identification verified by 2 forms. Sims Duck, RN    Called physical therapist. No answer. LDVM with provider response.

## 2024-03-04 ENCOUNTER — Ambulatory Visit: Admitting: Physician Assistant

## 2024-03-29 ENCOUNTER — Emergency Department (HOSPITAL_COMMUNITY)

## 2024-03-29 ENCOUNTER — Emergency Department (HOSPITAL_COMMUNITY)
Admission: EM | Admit: 2024-03-29 | Discharge: 2024-03-29 | Disposition: A | Discharge status: Home / Self Care | Source: Other Acute Inpatient Hospital | Attending: Emergency Medicine | Admitting: Emergency Medicine

## 2024-03-29 ENCOUNTER — Other Ambulatory Visit: Payer: Self-pay

## 2024-03-29 DIAGNOSIS — Z79899 Other long term (current) drug therapy: Secondary | ICD-10-CM | POA: Insufficient documentation

## 2024-03-29 DIAGNOSIS — S0990XA Unspecified injury of head, initial encounter: Secondary | ICD-10-CM | POA: Diagnosis not present

## 2024-03-29 DIAGNOSIS — W19XXXA Unspecified fall, initial encounter: Secondary | ICD-10-CM

## 2024-03-29 DIAGNOSIS — W01198A Fall on same level from slipping, tripping and stumbling with subsequent striking against other object, initial encounter: Secondary | ICD-10-CM | POA: Diagnosis not present

## 2024-03-29 DIAGNOSIS — Z86718 Personal history of other venous thrombosis and embolism: Secondary | ICD-10-CM | POA: Insufficient documentation

## 2024-03-29 DIAGNOSIS — Z8673 Personal history of transient ischemic attack (TIA), and cerebral infarction without residual deficits: Secondary | ICD-10-CM | POA: Diagnosis not present

## 2024-03-29 DIAGNOSIS — Z951 Presence of aortocoronary bypass graft: Secondary | ICD-10-CM | POA: Diagnosis not present

## 2024-03-29 DIAGNOSIS — Z7901 Long term (current) use of anticoagulants: Secondary | ICD-10-CM | POA: Insufficient documentation

## 2024-03-29 DIAGNOSIS — Z87891 Personal history of nicotine dependence: Secondary | ICD-10-CM | POA: Diagnosis not present

## 2024-03-29 DIAGNOSIS — M25561 Pain in right knee: Secondary | ICD-10-CM | POA: Diagnosis present

## 2024-03-29 DIAGNOSIS — I2510 Atherosclerotic heart disease of native coronary artery without angina pectoris: Secondary | ICD-10-CM | POA: Diagnosis not present

## 2024-03-29 DIAGNOSIS — N1832 Chronic kidney disease, stage 3b: Secondary | ICD-10-CM | POA: Diagnosis not present

## 2024-03-29 DIAGNOSIS — S8991XA Unspecified injury of right lower leg, initial encounter: Secondary | ICD-10-CM | POA: Insufficient documentation

## 2024-03-29 DIAGNOSIS — I129 Hypertensive chronic kidney disease with stage 1 through stage 4 chronic kidney disease, or unspecified chronic kidney disease: Secondary | ICD-10-CM | POA: Diagnosis not present

## 2024-03-29 LAB — CBC WITH DIFFERENTIAL/PLATELET
Abs Immature Granulocytes: 0.04 K/uL (ref 0.00–0.07)
Basophils Absolute: 0 K/uL (ref 0.0–0.1)
Basophils Relative: 1 %
Eosinophils Absolute: 0.3 K/uL (ref 0.0–0.5)
Eosinophils Relative: 3 %
HCT: 41 % (ref 39.0–52.0)
Hemoglobin: 13.8 g/dL (ref 13.0–17.0)
Immature Granulocytes: 1 %
Lymphocytes Relative: 20 %
Lymphs Abs: 1.6 K/uL (ref 0.7–4.0)
MCH: 31.7 pg (ref 26.0–34.0)
MCHC: 33.7 g/dL (ref 30.0–36.0)
MCV: 94.3 fL (ref 80.0–100.0)
Monocytes Absolute: 0.9 K/uL (ref 0.1–1.0)
Monocytes Relative: 12 %
Neutro Abs: 5 K/uL (ref 1.7–7.7)
Neutrophils Relative %: 63 %
Platelets: 170 K/uL (ref 150–400)
RBC: 4.35 MIL/uL (ref 4.22–5.81)
RDW: 13.2 % (ref 11.5–15.5)
WBC: 7.8 K/uL (ref 4.0–10.5)
nRBC: 0 % (ref 0.0–0.2)

## 2024-03-29 LAB — BASIC METABOLIC PANEL WITH GFR
Anion gap: 9 (ref 5–15)
BUN: 23 mg/dL (ref 8–23)
CO2: 24 mmol/L (ref 22–32)
Calcium: 10 mg/dL (ref 8.9–10.3)
Chloride: 106 mmol/L (ref 98–111)
Creatinine, Ser: 2.06 mg/dL — ABNORMAL HIGH (ref 0.61–1.24)
GFR, Estimated: 30 mL/min — ABNORMAL LOW (ref >60)
Glucose, Bld: 114 mg/dL — ABNORMAL HIGH (ref 70–99)
Potassium: 4.2 mmol/L (ref 3.5–5.1)
Sodium: 139 mmol/L (ref 135–145)

## 2024-03-29 LAB — TROPONIN I (HIGH SENSITIVITY)
Troponin I (High Sensitivity): 65 ng/L — ABNORMAL HIGH (ref <18)
Troponin I (High Sensitivity): 66 ng/L — ABNORMAL HIGH (ref <18)

## 2024-03-29 LAB — CK: Total CK: 41 U/L — ABNORMAL LOW (ref 49–397)

## 2024-03-29 MED ORDER — HYDROCODONE-ACETAMINOPHEN 5-325 MG PO TABS
1.0000 | ORAL_TABLET | Freq: Once | ORAL | Status: AC
  Administered 2024-03-29: 1 via ORAL
  Filled 2024-03-29: qty 1

## 2024-03-29 MED ORDER — HYDROCODONE-ACETAMINOPHEN 5-325 MG PO TABS: 1.0000 | ORAL_TABLET | ORAL | 0 refills | Status: DC | PRN

## 2024-03-29 MED ORDER — HYDROCODONE-ACETAMINOPHEN 5-325 MG PO TABS: 1.0000 | ORAL_TABLET | ORAL | 0 refills | Status: AC | PRN

## 2024-03-29 NOTE — ED Notes (Signed)
 Pt still says his pain in his R knee is 8/10 with movement. Daughter is at bedside and says that he usually gets the fluid on his R knee drained, last time being drained in May. Provider notified.

## 2024-03-29 NOTE — ED Notes (Signed)
 Pt is resting in bed. No acute distress. Denies any headache, dizziness, or shob. Pt only complains of R knee pain 8/10 when trying to bend his knee. Pt cooperative, calm, and alert.

## 2024-03-29 NOTE — ED Provider Notes (Signed)
 [place] EMERGENCY DEPARTMENT AT [facility] Provider Note  CSN: [id number] Arrival date & time: 03/29/24 9282  Chief Complaint(s) Fall  HPI Patrick Giles is a 89 y.o. male with past medical history as below, significant for CAD, DVT on eliquis , NSVT, HTN, HLD, CVA who presents to the ED with complaint of fot  Pt with trip and fall this morning, hit his had on the handle of a door, denies LOC. Also hurt his right knee, unable to stand after the fall 2/2 right knee pain. He denies and chest or abd pain, no n/v, no incontinence, no numbness or weakness. Also had a fall yesterday and did hit head at that time as well w/o LOC   He has chronic right-sided knee pain, has frequent arthrocentesis, last time he had this was around 2 months ago.  Chronic pain to his right knee.  Past Medical History Past Medical History:  Diagnosis Date   CAD (coronary artery disease)    DVT, lower extremity (HCC) 07/23/2014   Dyslipidemia    Hypertension    NSVT (nonsustained ventricular tachycardia) (HCC) 01/14/2024   S/P CABG (coronary artery bypass graft) 1998   LIMA to LAD, VG to PDA, VG to first diagonal   Stroke [facility])    Patient Active Problem List   Diagnosis Date Noted   Fall at home, initial encounter 01/14/2024   Elevated troponin 01/14/2024   History of CVA (cerebrovascular accident) 01/14/2024   Diverticulosis of colon 10/03/2022   Chronic a-fib (HCC) 10/03/2022   Thoracic aortic aneurysm without rupture (HCC) 01/18/2018   Ascending aorta dilatation (HCC) 01/18/2017   Essential hypertension 07/24/2014   History of DVT (deep vein thrombosis) 07/24/2014   CAD (coronary artery disease) 11/18/2013   S/P CABG x 3 11/18/2013   Cardiomyopathy, ischemic 11/18/2013   Dyslipidemia 11/18/2013   CKD stage 3b, GFR 30-44 ml/min (HCC) 11/18/2013   Impaired fasting glucose 11/18/2013   Home Medication(s) Prior to Admission medications   Medication Sig Start Date End Date Taking?  Authorizing Provider  HYDROcodone -acetaminophen  (NORCO/VICODIN) 5-325 MG tablet Take 1 tablet by mouth every 4 (four) hours as needed. 03/29/24  Yes Elnor Savant A, DO  apixaban  (ELIQUIS ) 2.5 MG TABS tablet TAKE 1 TABLET BY MOUTH TWICE A DAY Patient taking differently: Take 2.5 mg by mouth 2 (two) times daily. 01/06/22   Hilty, Vinie BROCKS, MD  atorvastatin  (LIPITOR ) 40 MG tablet TAKE 1 TABLET BY MOUTH EVERY DAY AT 6PM Patient taking differently: Take 40 mg by mouth at bedtime. 02/19/18   Hilty, Vinie BROCKS, MD  Garlic 1000 MG CAPS Take 1,000 mg by mouth daily.    [provider]  metoprolol  succinate (TOPROL -XL) 25 MG 24 hr tablet Take 25 mg by mouth at bedtime.    [provider]  Multiple Vitamins-Minerals (ALIVE MENS ENERGY PO) Take 1 tablet by mouth daily with breakfast.    [provider]  Omega-3 Fatty Acids (FISH OIL) 1000 MG CAPS Take 1,000 mg by mouth in the morning.    [provider]  Past Surgical History Past Surgical History:  Procedure Laterality Date   CHOLECYSTECTOMY  1998   CORONARY ARTERY BYPASS GRAFT  07/08/1997   LIMA to LAD, reverse SVG to PDA, reverse SVG to first diagonal (Dr. CHARLENA Jude)   TRANSTHORACIC ECHOCARDIOGRAM  11/29/2012   EF 45-50%, grade 1 diastolic dysfunction   Family History Family History  Problem Relation Age of Onset   Emphysema Mother    Colon cancer Father    Colon polyps Father    Lung cancer Brother    Diabetes Neg Hx    Kidney disease Neg Hx    Esophageal cancer Neg Hx     Social History Social History   Tobacco Use   Smoking status: Former    Types: Cigars    Quit date: 09/19/1988    Years since quitting: 35.5   Smokeless tobacco: Never  Vaping Use   Vaping status: Never Used  Substance Use Topics   Alcohol use: No    Alcohol/week: 0.0 standard drinks of alcohol   Drug  use: No   Allergies Patient has no known allergies.  Review of Systems A thorough review of systems was obtained and all systems are negative except as noted in the HPI and PMH.   Physical Exam Vital Signs  I have reviewed the triage vital signs BP (!) 147/95   Pulse 82   Temp 98 F (36.7 C) (Oral)   Resp 14   Ht 5' 11 (1.803 m)   Wt 84.4 kg   SpO2 95%   BMI 25.94 kg/m  Physical Exam Vitals and nursing note reviewed.  Constitutional:      General: He is not in acute distress.    Appearance: Normal appearance. He is well-developed. He is not ill-appearing.  HENT:     Head: Normocephalic and atraumatic.     Right Ear: External ear normal.     Left Ear: External ear normal.     Mouth/Throat:     Mouth: Mucous membranes are moist.  Eyes:     General: No scleral icterus.    Extraocular Movements: Extraocular movements intact.     Pupils: Pupils are equal, round, and reactive to light.  Cardiovascular:     Rate and Rhythm: Normal rate and regular rhythm.     Pulses: Normal pulses.     Heart sounds: Normal heart sounds.  Pulmonary:     Effort: Pulmonary effort is normal. No respiratory distress.     Breath sounds: Normal breath sounds.  Abdominal:     General: Abdomen is flat.     Palpations: Abdomen is soft.     Tenderness: There is no abdominal tenderness.  Musculoskeletal:     Cervical back: No rigidity.     Right lower leg: No edema.     Left lower leg: No edema.       Legs:     Comments: LE NVI  Achilles, quad, patella tendon intact b/l   Skin:    General: Skin is warm and dry.     Capillary Refill: Capillary refill takes less than 2 seconds.  Neurological:     Mental Status: He is alert and oriented to person, place, and time.     GCS: GCS eye subscore is 4. GCS verbal subscore is 5. GCS motor subscore is 6.     Cranial Nerves: Cranial nerves 2-12 are intact. No dysarthria.     Sensory: Sensation is intact.     Motor: Motor function is intact. No  weakness.     Coordination: Coordination is intact.  Psychiatric:        Mood and Affect: Mood normal.        Behavior: Behavior normal.     ED Results and Treatments Labs (all labs ordered are listed, but only abnormal results are displayed) Labs Reviewed  BASIC METABOLIC PANEL WITH GFR - Abnormal; Notable for the following components:      Result Value   Glucose, Bld 114 (*)    Creatinine, Ser 2.06 (*)    GFR, Estimated 30 (*)    All other components within normal limits  CK - Abnormal; Notable for the following components:   Total CK 41 (*)    All other components within normal limits  TROPONIN I (HIGH SENSITIVITY) - Abnormal; Notable for the following components:   Troponin I (High Sensitivity) 65 (*)    All other components within normal limits  TROPONIN I (HIGH SENSITIVITY) - Abnormal; Notable for the following components:   Troponin I (High Sensitivity) 66 (*)    All other components within normal limits  CBC WITH DIFFERENTIAL/PLATELET                                                                                                                          Radiology CT Cervical Spine Wo Contrast Result Date: 03/29/2024 CLINICAL DATA:  Fall.  Head injury.  Chronic anticoagulation. EXAM: CT CERVICAL SPINE WITHOUT CONTRAST TECHNIQUE: Multidetector CT imaging of the cervical spine was performed without intravenous contrast. Multiplanar CT image reconstructions were also generated. RADIATION DOSE REDUCTION: This exam was performed according to the departmental dose-optimization program which includes automated exposure control, adjustment of the mA and/or kV according to patient size and/or use of iterative reconstruction technique. COMPARISON:  01/14/2024 FINDINGS: Alignment: Reversal of normal cervical lordosis evident. No findings to suggest traumatic subluxation. Skull base and vertebrae: Motion degradation at the level of C2-3. Within this limitation, no evidence for an acute  fracture. Soft tissues and spinal canal: No prevertebral fluid or swelling. No visible canal hematoma. Disc levels: Marked loss of disc height with endplate degeneration noted at C4-5, C5-6, and C6-7. Diffuse facet osteoarthritis noted bilaterally. Upper chest: Unremarkable. Other: None. IMPRESSION: 1. Motion degradation at the level of C2-3. Within this limitation, no evidence for an acute fracture in the cervical spine. 2. Multilevel degenerative disc disease and facet osteoarthritis. Electronically Signed   By: Camellia Candle M.D.   On: 03/29/2024 08:12   CT Head Wo Contrast Result Date: 03/29/2024 CLINICAL DATA:  Status post fall with head injury. Chronic anticoagulation. EXAM: CT HEAD WITHOUT CONTRAST TECHNIQUE: Contiguous axial images were obtained from the base of the skull through the vertex without intravenous contrast. RADIATION DOSE REDUCTION: This exam was performed according to the departmental dose-optimization program which includes automated exposure control, adjustment of the mA and/or kV according to patient size and/or use of iterative reconstruction technique. COMPARISON:  01/14/2024 FINDINGS: Brain: There is no evidence for acute  hemorrhage, hydrocephalus, mass lesion, or abnormal extra-axial fluid collection. No definite CT evidence for acute infarction. Diffuse loss of parenchymal volume is consistent with atrophy. Patchy low attenuation in the deep hemispheric and periventricular white matter is nonspecific, but likely reflects chronic microvascular ischemic demyelination. Chronic post ischemic changes in the basal ganglia bilaterally, similar to prior. Vascular: No hyperdense vessel or unexpected calcification. Skull: No evidence for fracture. No worrisome lytic or sclerotic lesion. Sinuses/Orbits: Visualized paranasal sinuses are clear. Right mastoid effusion again noted. Visualized portions of the globes and intraorbital fat are unremarkable. Other: None. IMPRESSION: 1. No acute  intracranial abnormality. 2. Atrophy with chronic small vessel ischemic disease. 3. Stable right mastoid effusion. Electronically Signed   By: Camellia Candle M.D.   On: 03/29/2024 08:09   DG Chest Portable 1 View Result Date: 03/29/2024 CLINICAL DATA:  Fall injury on blood thinners with polytrauma. EXAM: PORTABLE CHEST 1 VIEW AP PELVIS 1 VIEW RIGHT KNEE 2 VIEWS COMPARISON:  Portable chest 01/14/2024 CT abdomen pelvis 01/14/2024, and AP Lat right knee 10/25/2023. FINDINGS: Chest AP portable 7:29 a.m.: Again noted markedly elevated left hemidiaphragm consistent with diaphragmatic herniation or paresis. This obscures the lower 2/3 of the left chest. The visualized lungs are clear. The sulci are sharp. There is cardiomegaly with sternotomy and CABG changes without evidence of CHF. The mediastinum is stable. There is aortic atherosclerosis. There are healed fractures of the left ribcage but no acute osseous findings. Cholecystectomy clips. AP pelvis: No AP evidence of pelvic fracture or diastasis. No evidence of other focal bone lesions. The proximal femurs are both grossly intact. There is mild nonerosive arthrosis at the hips, SI joints and symphysis, degenerative change lower lumbar facet joints. There are moderate iliofemoral arterial calcific plaques. Soft tissues otherwise unremarkable. AP Lat right knee: Minimal suprapatellar bursal effusion is seen. Multiple surgical clips in the medial right foreleg. Heavy vascular calcification. There is normal bone mineralization without evidence of fracture or dislocation. The femorotibial joint is unremarkable. There is trace spurring at the patellofemoral joint. There previously was significantly larger suprapatellar bursal effusion. In all other respects there are no further changes. IMPRESSION: 1. No evidence of acute chest disease. 2. Cardiomegaly with sternotomy and CABG changes. 3. Markedly elevated left hemidiaphragm consistent with diaphragmatic herniation or  paresis. 4. No AP evidence of pelvic fracture or diastasis. 5. Minimal suprapatellar bursal effusion right knee. No evidence of fracture or dislocation. 6. Heavy vascular calcification. 7. Degenerative changes. Electronically Signed   By: Francis Quam M.D.   On: 03/29/2024 07:50   DG Pelvis 1-2 Views Result Date: 03/29/2024 CLINICAL DATA:  Fall injury on blood thinners with polytrauma. EXAM: PORTABLE CHEST 1 VIEW AP PELVIS 1 VIEW RIGHT KNEE 2 VIEWS COMPARISON:  Portable chest 01/14/2024 CT abdomen pelvis 01/14/2024, and AP Lat right knee 10/25/2023. FINDINGS: Chest AP portable 7:29 a.m.: Again noted markedly elevated left hemidiaphragm consistent with diaphragmatic herniation or paresis. This obscures the lower 2/3 of the left chest. The visualized lungs are clear. The sulci are sharp. There is cardiomegaly with sternotomy and CABG changes without evidence of CHF. The mediastinum is stable. There is aortic atherosclerosis. There are healed fractures of the left ribcage but no acute osseous findings. Cholecystectomy clips. AP pelvis: No AP evidence of pelvic fracture or diastasis. No evidence of other focal bone lesions. The proximal femurs are both grossly intact. There is mild nonerosive arthrosis at the hips, SI joints and symphysis, degenerative change lower lumbar facet joints. There are moderate iliofemoral  arterial calcific plaques. Soft tissues otherwise unremarkable. AP Lat right knee: Minimal suprapatellar bursal effusion is seen. Multiple surgical clips in the medial right foreleg. Heavy vascular calcification. There is normal bone mineralization without evidence of fracture or dislocation. The femorotibial joint is unremarkable. There is trace spurring at the patellofemoral joint. There previously was significantly larger suprapatellar bursal effusion. In all other respects there are no further changes. IMPRESSION: 1. No evidence of acute chest disease. 2. Cardiomegaly with sternotomy and CABG  changes. 3. 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 4. No AP evidence of pelvic fracture or diastasis. 5. Minimal suprapatellar bursal effusion right knee. No evidence of fracture or dislocation. 6. Heavy vascular calcification. 7. Degenerative changes. Electronically Signed   By: Francis Quam M.D.   On: 03/29/2024 07:50   DG Knee Complete 4 Views Right Result Date: 03/29/2024 CLINICAL DATA:  Fall injury on blood thinners with polytrauma. EXAM: PORTABLE CHEST 1 VIEW AP PELVIS 1 VIEW RIGHT KNEE 2 VIEWS COMPARISON:  Portable chest 01/14/2024 CT abdomen pelvis 01/14/2024, and AP Lat right knee 10/25/2023. FINDINGS: Chest AP portable 7:29 a.m.: Again noted markedly elevated left hemidiaphragm consistent with diaphragmatic herniation or paresis. This obscures the lower 2/3 of the left chest. The visualized lungs are clear. The sulci are sharp. There is cardiomegaly with sternotomy and CABG changes without evidence of CHF. The mediastinum is stable. There is aortic atherosclerosis. There are healed fractures of the left ribcage but no acute osseous findings. Cholecystectomy clips. AP pelvis: No AP evidence of pelvic fracture or diastasis. No evidence of other focal bone lesions. The proximal femurs are both grossly intact. There is mild nonerosive arthrosis at the hips, SI joints and symphysis, degenerative change lower lumbar facet joints. There are moderate iliofemoral arterial calcific plaques. Soft tissues otherwise unremarkable. AP Lat right knee: Minimal suprapatellar bursal effusion is seen. Multiple surgical clips in the medial right foreleg. Heavy vascular calcification. There is normal bone mineralization without evidence of fracture or dislocation. The femorotibial joint is unremarkable. There is trace spurring at the patellofemoral joint. There previously was significantly larger suprapatellar bursal effusion. In all other respects there are no further  changes. IMPRESSION: 1. No evidence of acute chest disease. 2. Cardiomegaly with sternotomy and CABG changes. 3. 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 4. No AP evidence of pelvic fracture or diastasis. 5. Minimal suprapatellar bursal effusion right knee. No evidence of fracture or dislocation. 6. Heavy vascular calcification. 7. Degenerative changes. Electronically Signed   By: Francis Quam M.D.   On: 03/29/2024 07:50    Pertinent labs & imaging results that were available during my care of the patient were reviewed by me and considered in my medical decision making (see MDM for details).  Medications Ordered in ED Medications  HYDROcodone -acetaminophen  (NORCO/VICODIN) 5-325 MG per tablet 1 tablet (1 tablet Oral Given 03/29/24 1150)  Procedures Procedures  (including critical care time)  Medical Decision Making / ED Course    Medical Decision Making:    Patrick Giles is a 89 y.o. male with past medical history as below, significant for CAD, DVT on eliquis , NSVT, HTN, HLD, CVA who presents to the ED with complaint of fot. The complaint involves an extensive differential diagnosis and also carries with it a high risk of complications and morbidity.  Serious etiology was considered. Ddx includes but is not limited to: Differential diagnoses for head trauma includes subdural hematoma, epidural hematoma, acute concussion, traumatic subarachnoid hemorrhage, cerebral contusions, etc.   Complete initial physical exam performed, notably the patient was in no distress, HDS3.    Reviewed and confirmed nursing documentation for past medical history, family history, social history.  Vital signs reviewed.    Fall on thinners w/ head injury Right knee injury > - apparent mechanical fall, no LOC - level 2 trauma, primary  survey completed and unremarkable, he does have some mild ttp to his right knee but no point tenderness or obvious deformity - similar presentation 4/27 which did require admission, echo at that time was technically difficult - Workup here is reassuring, labs are stable, imaging is stable. - Some pain to the right knee, discussed treatment options going forward, advised patient and family bedside that unable to entirely r/o an occult fx and offered CT although the knee XR does appear stable, patient/family bedside prefer to trial outpatient management first with mobilization, analgesia and to follow-up PCP next week. - favor knee pain is likely acute on chronic right knee pain given hx of chronic knee pain, he has small effusion here, has normal ROM and no sig pain w/ provocative testing to the right knee, quad/patella ligaments intact, no pain to his hip reported - pt eager for dc  Clinical Course as of 03/29/24 1457  Fri Mar 29, 2024  0914 Creatinine(!): 2.06 Similar to [SG]  0939 Spoke with family  [SG]  1151 Attempt to ambulate patient but difficulty secondary to right knee pain.  Will give analgesia and recheck.  Knee x-ray was with a small effusion, he has had this drained previously.  No fracture.  History of chronic knee pain [SG]  1346 Pain to right knee w/ ambulation [SG]    Clinical Course User Index [SG] Elnor Savant A, DO     Concussion precautions provided  Patient in no distress and overall condition is stable. Detailed discussions were had with the patient/guardian regarding current findings, and need for close f/u with PCP or on call doctor. The patient/guardian has been instructed to return immediately if the symptoms worsen in any way for re-evaluation. Patient/guardian verbalized understanding and is in agreement with current care plan. All questions answered prior to discharge.                Additional history obtained: -Additional history obtained from  ems -External records from outside source obtained and reviewed including: Chart review including previous notes, labs, imaging, consultation notes including  Home meds Prior imaging     Lab Tests: -I ordered, reviewed, and interpreted labs.   The pertinent results include:   Labs Reviewed  BASIC METABOLIC PANEL WITH GFR - Abnormal; Notable for the following components:      Result Value   Glucose, Bld 114 (*)    Creatinine, Ser 2.06 (*)    GFR, Estimated 30 (*)    All other components within normal limits  CK - Abnormal;  Notable for the following components:   Total CK 41 (*)    All other components within normal limits  TROPONIN I (HIGH SENSITIVITY) - Abnormal; Notable for the following components:   Troponin I (High Sensitivity) 65 (*)    All other components within normal limits  TROPONIN I (HIGH SENSITIVITY) - Abnormal; Notable for the following components:   Troponin I (High Sensitivity) 66 (*)    All other components within normal limits  CBC WITH DIFFERENTIAL/PLATELET    Notable for labs stable  EKG   EKG Interpretation Date/Time:  Friday March 29 2024 07:43:19 EDT Ventricular Rate:  81 PR Interval:  208 QRS Duration:  129 QT Interval:  419 QTC Calculation: 487 R Axis:   -19  Text Interpretation: Sinus rhythm Right bundle branch block similar to prior no stemi Confirmed by Elnor Savant ([id number]) on 03/29/2024 10:47:23 AM         Imaging Studies ordered: I ordered imaging studies including cth, ct cervical, cxr, pelvis xr, knee xr R I independently visualized the following imaging with scope of interpretation limited to determining acute life threatening conditions related to emergency care; findings noted above I agree with the radiologist interpretation If any imaging was obtained with contrast I closely monitored patient for any possible adverse reaction a/w contrast administration in the emergency department   Medicines ordered and prescription drug  management: Meds ordered this encounter  Medications   HYDROcodone -acetaminophen  (NORCO/VICODIN) 5-325 MG per tablet 1 tablet    Refill:  0   HYDROcodone -acetaminophen  (NORCO/VICODIN) 5-325 MG tablet    Sig: Take 1 tablet by mouth every 4 (four) hours as needed.    Dispense:  10 tablet    Refill:  0    -I have reviewed the patients home medicines and have made adjustments as needed   Consultations Obtained: na   Cardiac Monitoring: The patient was maintained on a cardiac monitor.  I personally viewed and interpreted the cardiac monitored which showed an underlying rhythm of: nsr Continuous pulse oximetry interpreted by myself, 100% on ra.    Social Determinants of Health:  Diagnosis or treatment significantly limited by social determinants of health: na   Reevaluation: After the interventions noted above, I reevaluated the patient and found that they have improved  Co morbidities that complicate the patient evaluation  Past Medical History:  Diagnosis Date   CAD (coronary artery disease)    DVT, lower extremity (HCC) 07/23/2014   Dyslipidemia    Hypertension    NSVT (nonsustained ventricular tachycardia) (HCC) 01/14/2024   S/P CABG (coronary artery bypass graft) 1998   LIMA to LAD, VG to PDA, VG to first diagonal   Stroke [facility])       Dispostion: Disposition decision including need for hospitalization was considered, and patient discharged from emergency department.    Final Clinical Impression(s) / ED Diagnoses Final diagnoses:  Fall, initial encounter  Injury of right knee, initial encounter        Elnor Savant LABOR, DO 03/29/24 1457

## 2024-03-29 NOTE — ED Triage Notes (Signed)
 Pt bibems from home. Daughter witnessed fall today and yesterday. No LOC, did hit his head for both falls. Today his head hit a door, taking eliquis . R knee was injured when he fell yesterday but he was able to get up, today he has been having trouble with his R knee.

## 2024-03-29 NOTE — ED Notes (Signed)
 Pt changed out of soiled underwear (underwear put in pt belonging bag). Pt changed into new gown and cleaned up.

## 2024-03-29 NOTE — Progress Notes (Signed)
 Orthopedic Tech Progress Note Patient Details:  Patrick Giles 05-07-1933 [id number]  Ortho Devices Type of Ortho Device: Knee Immobilizer Ortho Device/Splint Location: RLE Ortho Device/Splint Interventions: Application, Ordered, Adjustment   Post Interventions Patient Tolerated: Well Instructions Provided: Other (comment), Care of device, Poper ambulation with device, Adjustment of device Instructed patient and family on how to readjust knee immobilizer [name] 03/29/2024, 3:36 PM

## 2024-03-29 NOTE — ED Notes (Signed)
 This RN and tech assisted pt to standing in attempt to ambulate. Pt unable to place weight on his right leg, complains of pain when trying to stand. Unable to take a step forward, pt shuffled around and stated that he was unable to walk. Pt assisted back in the bed. Provider made aware.

## 2024-03-29 NOTE — ED Notes (Signed)
 Lab called to get an update on troponin results. Lab tech states that she will pull the blood now and have the test ran. Provider notified.

## 2024-03-29 NOTE — ED Notes (Signed)
 CCMD called, pt on monitor

## 2024-03-29 NOTE — Discharge Instructions (Addendum)
 It was a pleasure caring for you today in the emergency department.  Please keep your right knee elevated, you can use ice intermittently over the next 24 hours.  Please follow-up with your PCP next week for recheck. Return if symptoms worsen  Based on the events which brought you to the ER today, it is possible that you may have a concussion. A concussion occurs when there is a blow to the head or body, with enough force to shake the brain and disrupt how the brain functions. You may experience symptoms such as headaches, sensitivity to light/noise, dizziness, cognitive slowing, difficulty concentrating / remembering, trouble sleeping and drowsiness. These symptoms may last anywhere from hours/days to potentially weeks/months. While these symptoms are very frustrating and perhaps debilitating, it is important that you remember that they will improve over time. Everyone has a different rate of recovery; it is difficult to predict when your symptoms will resolve. In order to allow for your brain to heal after the injury, we recommend that you see your primary physician or a physician knowledgeable in concussion management. We also advise you to let your body and brain rest: avoid physical activities (sports, gym, and exercise) and reduce cognitive demands (reading, texting, TV watching, computer use, video games, etc). School attendance, after-school activities and work may need to be modified to avoid increasing symptoms. We recommend against driving until until all symptoms have resolved. You should take 650mg  of Acetaminophen  (Tylenol ) every 4 hours as needed for pain control; however, taking anti-inflammatory medication (Motrin/Advil/Ibuprofen) is not advised. Come back to the ER right away if you are having repeated episodes of vomiting, severe/worsening headache/dizziness or any other symptom that alarms you. We recommended that someone stay with you for the next 24 hours to monitor for these worrisome  symptoms.   Please return to the emergency department for any worsening or worrisome symptoms.

## 2024-03-30 ENCOUNTER — Emergency Department (HOSPITAL_COMMUNITY)
Admission: EM | Admit: 2024-03-30 | Discharge: 2024-03-30 | Disposition: A | Discharge status: Home / Self Care | Attending: Emergency Medicine | Admitting: Emergency Medicine

## 2024-03-30 ENCOUNTER — Encounter (HOSPITAL_COMMUNITY): Payer: Self-pay | Admitting: *Deleted

## 2024-03-30 ENCOUNTER — Other Ambulatory Visit: Payer: Self-pay

## 2024-03-30 ENCOUNTER — Emergency Department (HOSPITAL_COMMUNITY)

## 2024-03-30 DIAGNOSIS — D72829 Elevated white blood cell count, unspecified: Secondary | ICD-10-CM | POA: Diagnosis not present

## 2024-03-30 DIAGNOSIS — Z7901 Long term (current) use of anticoagulants: Secondary | ICD-10-CM | POA: Diagnosis not present

## 2024-03-30 DIAGNOSIS — R531 Weakness: Secondary | ICD-10-CM | POA: Insufficient documentation

## 2024-03-30 DIAGNOSIS — I10 Essential (primary) hypertension: Secondary | ICD-10-CM | POA: Diagnosis not present

## 2024-03-30 DIAGNOSIS — Z79899 Other long term (current) drug therapy: Secondary | ICD-10-CM | POA: Insufficient documentation

## 2024-03-30 DIAGNOSIS — M25561 Pain in right knee: Secondary | ICD-10-CM | POA: Diagnosis present

## 2024-03-30 DIAGNOSIS — I2510 Atherosclerotic heart disease of native coronary artery without angina pectoris: Secondary | ICD-10-CM | POA: Diagnosis not present

## 2024-03-30 DIAGNOSIS — M10061 Idiopathic gout, right knee: Secondary | ICD-10-CM | POA: Insufficient documentation

## 2024-03-30 DIAGNOSIS — Z951 Presence of aortocoronary bypass graft: Secondary | ICD-10-CM | POA: Diagnosis not present

## 2024-03-30 DIAGNOSIS — Z8673 Personal history of transient ischemic attack (TIA), and cerebral infarction without residual deficits: Secondary | ICD-10-CM | POA: Diagnosis not present

## 2024-03-30 DIAGNOSIS — M109 Gout, unspecified: Secondary | ICD-10-CM

## 2024-03-30 LAB — URINALYSIS, ROUTINE W REFLEX MICROSCOPIC
Bacteria, UA: NONE SEEN
Bilirubin Urine: NEGATIVE
Glucose, UA: NEGATIVE mg/dL
Hgb urine dipstick: NEGATIVE
Ketones, ur: NEGATIVE mg/dL
Leukocytes,Ua: NEGATIVE
Nitrite: NEGATIVE
Protein, ur: 100 mg/dL — AB
Specific Gravity, Urine: 1.017 (ref 1.005–1.030)
pH: 5 (ref 5.0–8.0)

## 2024-03-30 LAB — CBC WITH DIFFERENTIAL/PLATELET
Abs Immature Granulocytes: 0.06 K/uL (ref 0.00–0.07)
Basophils Absolute: 0 K/uL (ref 0.0–0.1)
Basophils Relative: 0 %
Eosinophils Absolute: 0 K/uL (ref 0.0–0.5)
Eosinophils Relative: 0 %
HCT: 40.2 % (ref 39.0–52.0)
Hemoglobin: 13.6 g/dL (ref 13.0–17.0)
Immature Granulocytes: 1 %
Lymphocytes Relative: 6 %
Lymphs Abs: 0.7 K/uL (ref 0.7–4.0)
MCH: 31.7 pg (ref 26.0–34.0)
MCHC: 33.8 g/dL (ref 30.0–36.0)
MCV: 93.7 fL (ref 80.0–100.0)
Monocytes Absolute: 1.2 K/uL — ABNORMAL HIGH (ref 0.1–1.0)
Monocytes Relative: 9 %
Neutro Abs: 10.6 K/uL — ABNORMAL HIGH (ref 1.7–7.7)
Neutrophils Relative %: 84 %
Platelets: 155 K/uL (ref 150–400)
RBC: 4.29 MIL/uL (ref 4.22–5.81)
RDW: 13.2 % (ref 11.5–15.5)
WBC: 12.6 K/uL — ABNORMAL HIGH (ref 4.0–10.5)
nRBC: 0 % (ref 0.0–0.2)

## 2024-03-30 LAB — SYNOVIAL CELL COUNT + DIFF, W/ CRYSTALS
Eosinophils-Synovial: 0 % (ref 0–1)
Lymphocytes-Synovial Fld: 0 % (ref 0–20)
Monocyte-Macrophage-Synovial Fluid: 7 % — ABNORMAL LOW (ref 50–90)
Neutrophil, Synovial: 93 % — ABNORMAL HIGH (ref 0–25)
WBC, Synovial: 2370 /mm3 — ABNORMAL HIGH (ref 0–200)

## 2024-03-30 LAB — BASIC METABOLIC PANEL WITH GFR
Anion gap: 6 (ref 5–15)
BUN: 22 mg/dL (ref 8–23)
CO2: 26 mmol/L (ref 22–32)
Calcium: 9.6 mg/dL (ref 8.9–10.3)
Chloride: 104 mmol/L (ref 98–111)
Creatinine, Ser: 2.07 mg/dL — ABNORMAL HIGH (ref 0.61–1.24)
GFR, Estimated: 30 mL/min — ABNORMAL LOW (ref >60)
Glucose, Bld: 146 mg/dL — ABNORMAL HIGH (ref 70–99)
Potassium: 4 mmol/L (ref 3.5–5.1)
Sodium: 136 mmol/L (ref 135–145)

## 2024-03-30 LAB — MAGNESIUM: Magnesium: 1.7 mg/dL (ref 1.7–2.4)

## 2024-03-30 MED ORDER — METOPROLOL TARTRATE 5 MG/5ML IV SOLN
5.0000 mg | Freq: Once | INTRAVENOUS | Status: AC
  Administered 2024-03-30: 5 mg via INTRAVENOUS
  Filled 2024-03-30: qty 5

## 2024-03-30 MED ORDER — HYDROCODONE-ACETAMINOPHEN 5-325 MG PO TABS
1.0000 | ORAL_TABLET | Freq: Once | ORAL | Status: AC
  Administered 2024-03-30: 1 via ORAL
  Filled 2024-03-30: qty 1

## 2024-03-30 MED ORDER — APIXABAN 2.5 MG PO TABS
2.5000 mg | ORAL_TABLET | Freq: Once | ORAL | Status: AC
  Administered 2024-03-30: 2.5 mg via ORAL
  Filled 2024-03-30: qty 1

## 2024-03-30 MED ORDER — PREDNISONE 10 MG PO TABS: ORAL_TABLET | ORAL | 0 refills | Status: AC

## 2024-03-30 MED ORDER — PREDNISONE 20 MG PO TABS
40.0000 mg | ORAL_TABLET | Freq: Once | ORAL | Status: AC
Start: 2024-03-30 — End: 2024-03-30
  Administered 2024-03-30: 40 mg via ORAL
  Filled 2024-03-30: qty 2

## 2024-03-30 MED ORDER — METOPROLOL SUCCINATE ER 25 MG PO TB24
25.0000 mg | ORAL_TABLET | Freq: Once | ORAL | Status: AC
Start: 2024-03-30 — End: 2024-03-30
  Administered 2024-03-30: 25 mg via ORAL
  Filled 2024-03-30: qty 1

## 2024-03-30 MED ORDER — LIDOCAINE-EPINEPHRINE 1 %-1:100000 IJ SOLN
20.0000 mL | Freq: Once | INTRAMUSCULAR | Status: AC
  Administered 2024-03-30: 20 mL
  Filled 2024-03-30: qty 1

## 2024-03-30 NOTE — Care Management (Signed)
 Transition of Care [facility]) - Emergency Department Mini Assessment   Patient Details  Name: Patrick Giles MRN: [id number] Date of Birth: [birth year]  Transition of Care [facility] CM/SW Contact:    Corean JAYSON Canary, RN Phone Number: 03/30/2024, 5:06 PM   Clinical Narrative:  Patient presented after a fall has had problems with right knee, increase in swelling, he has had it tapped several times.  He has 4 children and has 24 hour supervision. Spoke to 2 daughters outside the room as the patient was getting his knee aspirated. [facility] is the one that all entities can call to set ujp services. Introduced self and role. He will discharge home they wouod like a bed and bedside commode. Thyey have a wheelchair from his wife, however it is a coupole of years old. They would like a BSC. And home health. Discussed with Providers. They will order face to face forr home health PT Ot AIDE and CSW,. THIs wassent to [facility] for acceptance, he had them previously. DME to [facility]. [facility] will call the daughter for delivery/.  ED Mini Assessment: What brought you to the Emergency Department? : Swelling in knee falling  Barriers to Discharge: No Barriers Identified  Barrier interventions: DME from [facility], [facility] for HOme health     Interventions which prevented an admission or readmission: DME Provided, Home Health Consult or Services    Patient Contact and Communications Key Contact 1: [facility]      ,                 Admission diagnosis:  knee swollen, rt arm weakness, feverish Patient Active Problem List   Diagnosis Date Noted   Fall at home, initial encounter 01/14/2024   Elevated troponin 01/14/2024   History of CVA (cerebrovascular accident) 01/14/2024   Diverticulosis of colon 10/03/2022   Chronic a-fib (HCC) 10/03/2022   Thoracic aortic aneurysm without rupture (HCC) 01/18/2018   Ascending aorta dilatation (HCC) 01/18/2017   Essential hypertension 07/24/2014   History of DVT (deep vein  thrombosis) 07/24/2014   CAD (coronary artery disease) 11/18/2013   S/P CABG x 3 11/18/2013   Cardiomyopathy, ischemic 11/18/2013   Dyslipidemia 11/18/2013   CKD stage 3b, GFR 30-44 ml/min (HCC) 11/18/2013   Impaired fasting glucose 11/18/2013   PCP:  Donata Snowman, PA-C Pharmacy:   [facility] [phone] - [place], [place] - [address] AT [address] OF [facility] [address] [place] [address] Phone: [phone] Fax: [phone]  [id number] - [place], [place] - [address] AT [facility] [address] [place] [address] Phone: [phone] Fax: [phone]

## 2024-03-30 NOTE — ED Triage Notes (Addendum)
 States he fell several days ago was seen in the ed yest for fall , c/o right knee pain using ice , right knee more swollen and painful today , feels like he doesn't have control of his right c/o increased pain with weigh bearing

## 2024-03-30 NOTE — Discharge Instructions (Addendum)
 Symptoms improved after joint tap.  There was evidence of gout on the fluid analysis.  Prednisone  prescribed and first dose given in the emergency department.  Follow-up with your primary care doctor.  Return to the emergency department for any concerning symptoms. We also involve the social worker to help provide additional equipment and resources.  They will work on setting up a home physical therapist, occupational therapist, nurse and a home health aide.  Will also provide hospital bed and other equipment.

## 2024-03-30 NOTE — Care Management (Cosign Needed)
    Durable Medical Equipment  (From admission, onward)           Start     Ordered   03/30/24 1703  For home use only DME Bedside commode  Once       Comments: Knee aspiration debility  Question:  Patient needs a bedside commode to treat with the following condition  Answer:  Falls frequently   03/30/24 1702   03/30/24 1639  For home use only DME Hospital bed  Once       Question Answer Comment  Length of Need Lifetime   Patient has (list medical condition): CAD, DVT on eliquis , NSVT, HTN, HLD, CVA   The above medical condition requires: Patient requires the ability to reposition frequently   Head must be elevated greater than: 30 degrees   Bed type Semi-electric   Hoyer Lift Yes      03/30/24 1638   03/30/24 1637  For home use only DME lightweight manual wheelchair with seat cushion  Once       Comments: Patient suffers from  knee injury, CAD which impairs their ability to perform daily activities like toileting in the home.  A walker will not resolve  issue with performing activities of daily living. A wheelchair will allow patient to safely perform daily activities. Patient is not able to propel themselves in the home using a standard weight wheelchair due to general weakness. Patient can self propel in the lightweight wheelchair. Length of need Lifetime. Accessories: elevating leg rests (ELRs), wheel locks, extensions and anti-tippers.   03/30/24 1638           Patient has knee injury chronic and acute and is confined to one room

## 2024-03-30 NOTE — ED Provider Notes (Addendum)
 03/30/24 1703   For home use only DME Bedside commode  Once       Comments: Knee aspiration debility  Question:  Patient needs a bedside commode to treat with the following condition  Answer:  Falls frequently   03/30/24 1702     03/30/24 1639   For home use only DME Hospital bed  Once       Question Answer Comment  Length of Need Lifetime    Patient has (list medical condition): CAD, DVT on eliquis , NSVT, HTN, HLD, CVA    The above medical condition requires: Patient requires the ability to reposition frequently    Head must be elevated greater than: 30 degrees    Bed type Semi-electric    Hoyer Lift Yes       03/30/24 1638    03/30/24 1637   For home use only DME lightweight manual wheelchair with seat cushion  Once       Comments: Patient suffers from  knee injury, CAD which impairs their ability to perform daily activities like toileting in the home.  A walker will not resolve  issue with performing activities of daily living. A wheelchair will allow patient to safely perform daily activities. Patient is not able to propel themselves in the home using a standard weight wheelchair due to general weakness. Patient can self propel in the lightweight wheelchair. Length of need Lifetime. Accessories: elevating leg rests (ELRs), wheel locks, extensions and anti-tippers.   03/30/24 1638               Patient has knee injury chronic and acute and is confined to one room    Hildegard Loge, PA-C 03/30/24 1707    Hildegard Loge, PA-C 03/30/24 1714    Jerrol Agent, MD 03/30/24 [phone]

## 2024-03-30 NOTE — ED Notes (Signed)
 Per family bedside, pt was given (2) extra strength Tylenol  tabs at 0900. Family unsure if tabs totaled 500 mg or 1000 mg.

## 2024-03-30 NOTE — ED Provider Notes (Signed)
 [place] EMERGENCY DEPARTMENT AT [facility] Provider Note   CSN: [id number] Arrival date & time: 03/30/24  1148     Patient presents with: Knee Injury   Patrick Giles is a 89 y.o. male.   89 year old male presents with his 2 daughters for concern of worsening right knee pain and swelling since yesterday.  He was evaluated yesterday.  X-ray did not show any acute concern other than small joint effusion.  They state that they have placed a 4 times yesterday, 4 times today and the swelling has significantly worsened.  He is not able to range his knee which is unusual for him.  In the past he typically gets arthrocentesis every few months.  He denies any fever.        Prior to Admission medications   Medication Sig Start Date End Date Taking? Authorizing Provider  apixaban  (ELIQUIS ) 2.5 MG TABS tablet TAKE 1 TABLET BY MOUTH TWICE A DAY Patient taking differently: Take 2.5 mg by mouth 2 (two) times daily. 01/06/22   Hilty, Vinie BROCKS, MD  atorvastatin  (LIPITOR ) 40 MG tablet TAKE 1 TABLET BY MOUTH EVERY DAY AT 6PM Patient taking differently: Take 40 mg by mouth at bedtime. 02/19/18   Hilty, Vinie BROCKS, MD  Garlic 1000 MG CAPS Take 1,000 mg by mouth daily.    [provider]  HYDROcodone -acetaminophen  (NORCO/VICODIN) 5-325 MG tablet Take 1 tablet by mouth every 4 (four) hours as needed. 03/29/24   Elnor Jayson LABOR, DO  metoprolol  succinate (TOPROL -XL) 25 MG 24 hr tablet Take 25 mg by mouth at bedtime.    [provider]  Multiple Vitamins-Minerals (ALIVE MENS ENERGY PO) Take 1 tablet by mouth daily with breakfast.    [provider]  Omega-3 Fatty Acids (FISH OIL) 1000 MG CAPS Take 1,000 mg by mouth in the morning.    [provider]    Allergies: Patient has no known allergies.    Review of Systems  Constitutional:  Negative for chills and fever.  Musculoskeletal:  Positive for arthralgias and joint swelling.  All other systems reviewed and  are negative.   Updated Vital Signs BP 133/82   Pulse 85   Temp 98.4 F (36.9 C) (Oral)   Resp 20   Ht 5' 11 (1.803 m)   Wt 84 kg   SpO2 98%   BMI 25.83 kg/m   Physical Exam Vitals and nursing note reviewed.  Constitutional:      General: He is not in acute distress.    Appearance: Normal appearance. He is not ill-appearing.  HENT:     Head: Normocephalic and atraumatic.     Nose: Nose normal.  Eyes:     Conjunctiva/sclera: Conjunctivae normal.  Pulmonary:     Effort: Pulmonary effort is normal. No respiratory distress.  Musculoskeletal:        General: No deformity.     Comments: Significant swelling noted to the right knee.  Minimal warmth.  No overlying erythema.  Limited range of motion secondary to pain and swelling.  Neurovascularly intact in bilateral lower extremity.  Left knee without swelling, deformity, erythema and without tenderness palpation.  Mild tenderness to palpation in the right knee.  Right ankle and right hip without tenderness to palpation.  Skin:    Findings: No rash.  Neurological:     Mental Status: He is alert.     (all labs ordered are listed, but only abnormal results are displayed) Labs Reviewed  CBC WITH DIFFERENTIAL/PLATELET -  Abnormal; Notable for the following components:      Result Value   WBC 12.6 (*)    Neutro Abs 10.6 (*)    Monocytes Absolute 1.2 (*)    All other components within normal limits  BASIC METABOLIC PANEL WITH GFR  MAGNESIUM   URINALYSIS, ROUTINE W REFLEX MICROSCOPIC    EKG: None  Radiology: CT Cervical Spine Wo Contrast Result Date: 03/29/2024 CLINICAL DATA:  Fall.  Head injury.  Chronic anticoagulation. EXAM: CT CERVICAL SPINE WITHOUT CONTRAST TECHNIQUE: Multidetector CT imaging of the cervical spine was performed without intravenous contrast. Multiplanar CT image reconstructions were also generated. RADIATION DOSE REDUCTION: This exam was performed according to the departmental dose-optimization program  which includes automated exposure control, adjustment of the mA and/or kV according to patient size and/or use of iterative reconstruction technique. COMPARISON:  01/14/2024 FINDINGS: Alignment: Reversal of normal cervical lordosis evident. No findings to suggest traumatic subluxation. Skull base and vertebrae: Motion degradation at the level of C2-3. Within this limitation, no evidence for an acute fracture. Soft tissues and spinal canal: No prevertebral fluid or swelling. No visible canal hematoma. Disc levels: Marked loss of disc height with endplate degeneration noted at C4-5, C5-6, and C6-7. Diffuse facet osteoarthritis noted bilaterally. Upper chest: Unremarkable. Other: None. IMPRESSION: 1. Motion degradation at the level of C2-3. Within this limitation, no evidence for an acute fracture in the cervical spine. 2. Multilevel degenerative disc disease and facet osteoarthritis. Electronically Signed   By: Camellia Candle M.D.   On: 03/29/2024 08:12   CT Head Wo Contrast Result Date: 03/29/2024 CLINICAL DATA:  Status post fall with head injury. Chronic anticoagulation. EXAM: CT HEAD WITHOUT CONTRAST TECHNIQUE: Contiguous axial images were obtained from the base of the skull through the vertex without intravenous contrast. RADIATION DOSE REDUCTION: This exam was performed according to the departmental dose-optimization program which includes automated exposure control, adjustment of the mA and/or kV according to patient size and/or use of iterative reconstruction technique. COMPARISON:  01/14/2024 FINDINGS: Brain: There is no evidence for acute hemorrhage, hydrocephalus, mass lesion, or abnormal extra-axial fluid collection. No definite CT evidence for acute infarction. Diffuse loss of parenchymal volume is consistent with atrophy. Patchy low attenuation in the deep hemispheric and periventricular white matter is nonspecific, but likely reflects chronic microvascular ischemic demyelination. Chronic post ischemic  changes in the basal ganglia bilaterally, similar to prior. Vascular: No hyperdense vessel or unexpected calcification. Skull: No evidence for fracture. No worrisome lytic or sclerotic lesion. Sinuses/Orbits: Visualized paranasal sinuses are clear. Right mastoid effusion again noted. Visualized portions of the globes and intraorbital fat are unremarkable. Other: None. IMPRESSION: 1. No acute intracranial abnormality. 2. Atrophy with chronic small vessel ischemic disease. 3. Stable right mastoid effusion. Electronically Signed   By: Camellia Candle M.D.   On: 03/29/2024 08:09   DG Chest Portable 1 View Result Date: 03/29/2024 CLINICAL DATA:  Fall injury on blood thinners with polytrauma. EXAM: PORTABLE CHEST 1 VIEW AP PELVIS 1 VIEW RIGHT KNEE 2 VIEWS COMPARISON:  Portable chest 01/14/2024 CT abdomen pelvis 01/14/2024, and AP Lat right knee 10/25/2023. FINDINGS: Chest AP portable 7:29 a.m.: Again noted markedly elevated left hemidiaphragm consistent with diaphragmatic herniation or paresis. This obscures the lower 2/3 of the left chest. The visualized lungs are clear. The sulci are sharp. There is cardiomegaly with sternotomy and CABG changes without evidence of CHF. The mediastinum is stable. There is aortic atherosclerosis. There are healed fractures of the left ribcage but no acute osseous findings. Cholecystectomy  clips. AP pelvis: No AP evidence of pelvic fracture or diastasis. No evidence of other focal bone lesions. The proximal femurs are both grossly intact. There is mild nonerosive arthrosis at the hips, SI joints and symphysis, degenerative change lower lumbar facet joints. There are moderate iliofemoral arterial calcific plaques. Soft tissues otherwise unremarkable. AP Lat right knee: Minimal suprapatellar bursal effusion is seen. Multiple surgical clips in the medial right foreleg. Heavy vascular calcification. There is normal bone mineralization without evidence of fracture or dislocation. The  femorotibial joint is unremarkable. There is trace spurring at the patellofemoral joint. There previously was significantly larger suprapatellar bursal effusion. In all other respects there are no further changes. IMPRESSION: 1. No evidence of acute chest disease. 2. Cardiomegaly with sternotomy and CABG changes. 3. 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 4. No AP evidence of pelvic fracture or diastasis. 5. Minimal suprapatellar bursal effusion right knee. No evidence of fracture or dislocation. 6. Heavy vascular calcification. 7. Degenerative changes. Electronically Signed   By: Francis Quam M.D.   On: 03/29/2024 07:50   DG Pelvis 1-2 Views Result Date: 03/29/2024 CLINICAL DATA:  Fall injury on blood thinners with polytrauma. EXAM: PORTABLE CHEST 1 VIEW AP PELVIS 1 VIEW RIGHT KNEE 2 VIEWS COMPARISON:  Portable chest 01/14/2024 CT abdomen pelvis 01/14/2024, and AP Lat right knee 10/25/2023. FINDINGS: Chest AP portable 7:29 a.m.: Again noted markedly elevated left hemidiaphragm consistent with diaphragmatic herniation or paresis. This obscures the lower 2/3 of the left chest. The visualized lungs are clear. The sulci are sharp. There is cardiomegaly with sternotomy and CABG changes without evidence of CHF. The mediastinum is stable. There is aortic atherosclerosis. There are healed fractures of the left ribcage but no acute osseous findings. Cholecystectomy clips. AP pelvis: No AP evidence of pelvic fracture or diastasis. No evidence of other focal bone lesions. The proximal femurs are both grossly intact. There is mild nonerosive arthrosis at the hips, SI joints and symphysis, degenerative change lower lumbar facet joints. There are moderate iliofemoral arterial calcific plaques. Soft tissues otherwise unremarkable. AP Lat right knee: Minimal suprapatellar bursal effusion is seen. Multiple surgical clips in the medial right foreleg. Heavy vascular  calcification. There is normal bone mineralization without evidence of fracture or dislocation. The femorotibial joint is unremarkable. There is trace spurring at the patellofemoral joint. There previously was significantly larger suprapatellar bursal effusion. In all other respects there are no further changes. IMPRESSION: 1. No evidence of acute chest disease. 2. Cardiomegaly with sternotomy and CABG changes. 3. 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 4. No AP evidence of pelvic fracture or diastasis. 5. Minimal suprapatellar bursal effusion right knee. No evidence of fracture or dislocation. 6. Heavy vascular calcification. 7. Degenerative changes. Electronically Signed   By: Francis Quam M.D.   On: 03/29/2024 07:50   DG Knee Complete 4 Views Right Result Date: 03/29/2024 CLINICAL DATA:  Fall injury on blood thinners with polytrauma. EXAM: PORTABLE CHEST 1 VIEW AP PELVIS 1 VIEW RIGHT KNEE 2 VIEWS COMPARISON:  Portable chest 01/14/2024 CT abdomen pelvis 01/14/2024, and AP Lat right knee 10/25/2023. FINDINGS: Chest AP portable 7:29 a.m.: Again noted markedly elevated left hemidiaphragm consistent with diaphragmatic herniation or paresis. This obscures the lower 2/3 of the left chest. The visualized lungs are clear. The sulci are sharp. There is cardiomegaly with sternotomy and CABG changes without evidence of CHF. The mediastinum is stable. There is aortic atherosclerosis. There are healed fractures of the  left ribcage but no acute osseous findings. Cholecystectomy clips. AP pelvis: No AP evidence of pelvic fracture or diastasis. No evidence of other focal bone lesions. The proximal femurs are both grossly intact. There is mild nonerosive arthrosis at the hips, SI joints and symphysis, degenerative change lower lumbar facet joints. There are moderate iliofemoral arterial calcific plaques. Soft tissues otherwise unremarkable. AP Lat right knee: Minimal  suprapatellar bursal effusion is seen. Multiple surgical clips in the medial right foreleg. Heavy vascular calcification. There is normal bone mineralization without evidence of fracture or dislocation. The femorotibial joint is unremarkable. There is trace spurring at the patellofemoral joint. There previously was significantly larger suprapatellar bursal effusion. In all other respects there are no further changes. IMPRESSION: 1. No evidence of acute chest disease. 2. Cardiomegaly with sternotomy and CABG changes. 3. 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 4. No AP evidence of pelvic fracture or diastasis. 5. Minimal suprapatellar bursal effusion right knee. No evidence of fracture or dislocation. 6. Heavy vascular calcification. 7. Degenerative changes. Electronically Signed   By: Francis Quam M.D.   On: 03/29/2024 07:50     .Joint Aspiration/Arthrocentesis  Date/Time: 03/30/2024 8:17 PM  Performed by: Hildegard Loge, PA-C Authorized by: Hildegard Loge, PA-C   Consent:    Consent obtained:  Verbal   Consent given by:  Patient   Risks discussed:  Bleeding, pain, infection and incomplete drainage   Alternatives discussed:  No treatment Universal protocol:    Procedure explained and questions answered to patient or proxy's satisfaction: yes     Relevant documents present and verified: yes     Patient identity confirmed:  Verbally with patient Location:    Location:  Knee   Knee:  R knee Anesthesia:    Anesthesia method:  Local infiltration   Local anesthetic:  Lidocaine  2% WITH epi Procedure details:    Preparation: Patient was prepped and draped in usual sterile fashion     Needle gauge:  18 G   Ultrasound guidance: no     Approach:  Lateral   Aspirate amount:  20cc   Aspirate characteristics:  Blood-tinged   Steroid injected: no     Specimen collected: yes   Post-procedure details:    Dressing:  Adhesive bandage   Procedure completion:   Tolerated well, no immediate complications    Medications Ordered in the ED  HYDROcodone -acetaminophen  (NORCO/VICODIN) 5-325 MG per tablet 1 tablet (1 tablet Oral Given 03/30/24 1332)    Clinical Course as of 03/30/24 2014  Sat Mar 30, 2024  1601 Heart rate of 150s, saturations of 87 or documented.  Apparently this was after patient attempted to use urinal with the family's help.  On my evaluation his rate is in the 70s and satting 100% on room air.  Does not appear to be in any acute distress. [AA]  1602 CBC shows mild leukocytosis with some left shift.  This is increased from yesterday where he had a white count of 5.  BMP shows creatinine which is around his baseline no other acute concern.  UA without evidence of UTI. Will proceed with arthrocentesis. [AA]    Clinical Course User Index [AA] Hildegard Loge, PA-C                                 Medical Decision Making Amount and/or Complexity of Data Reviewed Labs: ordered. Radiology: ordered.  Risk Prescription drug management.  Medical Decision Making / ED Course   This patient presents to the ED for concern of knee pain, knee swelling, this involves an extensive number of treatment options, and is a complaint that carries with it a high risk of complications and morbidity.  The differential diagnosis includes fracture, arthritis, gout, septic joint  MDM: 89 year old male presents today for above-mentioned complaints. Has had worsening knee pain and swelling since yesterday.  Difficulty with range of motion. Has history of arthritis and requires intermittent arthrocentesis.  Last 1 a few months ago. CT obtained.  No acute fracture.  Does show moderate effusion.  Arthrocentesis performed.  Analysis pending. CBC does show mild leukocytosis with mild left shift.  No anemia.  BMP shows creatinine of 2.07 which is chronic otherwise without acute concern.  UA without evidence of UTI.   Patient was in rate controlled A-fib during his  stay.  Upon review of cardiology note this is a known issue.  He is anticoagulated and rate controlled.  He is not symptomatic from this.  They do not provide the notes recommend any intervention.  Discussed with attending.  Patient discharged in stable condition.  Return precaution discussed.  Prednisone  taper prescribed for gout.  Discussed follow-up with PCP.    Additional history obtained: -Additional history obtained from daughters who are at bedside -External records from outside source obtained and reviewed including: Chart review including previous notes, labs, imaging, consultation notes   Lab Tests: -I ordered, reviewed, and interpreted labs.   The pertinent results include:   Labs Reviewed  CBC WITH DIFFERENTIAL/PLATELET - Abnormal; Notable for the following components:      Result Value   WBC 12.6 (*)    Neutro Abs 10.6 (*)    Monocytes Absolute 1.2 (*)    All other components within normal limits  BASIC METABOLIC PANEL WITH GFR - Abnormal; Notable for the following components:   Glucose, Bld 146 (*)    Creatinine, Ser 2.07 (*)    GFR, Estimated 30 (*)    All other components within normal limits  URINALYSIS, ROUTINE W REFLEX MICROSCOPIC - Abnormal; Notable for the following components:   Protein, ur 100 (*)    All other components within normal limits  SYNOVIAL CELL COUNT + DIFF, W/ CRYSTALS - Abnormal; Notable for the following components:   Color, Synovial AMBER (*)    Appearance-Synovial TURBID (*)    WBC, Synovial 2,370 (*)    Neutrophil, Synovial 93 (*)    Monocyte-Macrophage-Synovial Fluid 7 (*)    All other components within normal limits  BODY FLUID CULTURE W GRAM STAIN  MAGNESIUM   GLUCOSE, BODY FLUID OTHER            PROTEIN, BODY FLUID (OTHER)  URIC ACID, BODY FLUID      EKG  EKG Interpretation Date/Time:  Saturday March 30 2024 16:04:17 EDT Ventricular Rate:  102 PR Interval:    QRS Duration:  127 QT Interval:  365 QTC Calculation: 476 R  Axis:   241  Text Interpretation: Atrial fibrillation Ventricular premature complex Right bundle branch block Confirmed by Jerrol Agent ([id number]) on 03/30/2024 6:32:46 PM         Imaging Studies ordered: I ordered imaging studies including CT of the right knee I independently visualized and interpreted imaging. I agree with the radiologist interpretation   Medicines ordered and prescription drug management: Meds ordered this encounter  Medications   HYDROcodone -acetaminophen  (NORCO/VICODIN) 5-325 MG per tablet 1 tablet    Refill:  0  lidocaine -EPINEPHrine  (XYLOCAINE  W/EPI) 1 %-1:100000 (with pres) injection 20 mL   metoprolol  tartrate (LOPRESSOR ) injection 5 mg   apixaban  (ELIQUIS ) tablet 2.5 mg   metoprolol  succinate (TOPROL -XL) 24 hr tablet 25 mg   predniSONE  (DELTASONE ) tablet 40 mg   predniSONE  (DELTASONE ) 10 MG tablet    Sig: Take 4 tablets (40 mg total) by mouth daily with breakfast for 4 days, THEN 2 tablets (20 mg total) daily with breakfast for 2 days, THEN 1 tablet (10 mg total) daily with breakfast for 2 days.    Dispense:  22 tablet    Refill:  0    Supervising Provider:   CLEOTILDE ROGUE [[id number]]    -I have reviewed the patients home medicines and have made adjustments as needed     Reevaluation: After the interventions noted above, I reevaluated the patient and found that they have :improved  Co morbidities that complicate the patient evaluation  Past Medical History:  Diagnosis Date   CAD (coronary artery disease)    DVT, lower extremity (HCC) 07/23/2014   Dyslipidemia    Hypertension    NSVT (nonsustained ventricular tachycardia) (HCC) 01/14/2024   S/P CABG (coronary artery bypass graft) 1998   LIMA to LAD, VG to PDA, VG to first diagonal   Stroke [facility])       Dispostion: Discharged in stable condition.  Return precaution discussed.  Patient voices understanding and is in agreement with plan.   Final diagnoses:  Acute gout of knee, unspecified  cause, unspecified laterality  Weakness    ED Discharge Orders          Ordered    predniSONE  (DELTASONE ) 10 MG tablet  Q breakfast        03/30/24 2012               Hildegard Loge, PA-C 03/30/24 2018    Jerrol Agent, MD 03/30/24 2118

## 2024-03-31 LAB — URIC ACID, BODY FLUID: Uric Acid Body Fluid: 8.3 mg/dL

## 2024-03-31 LAB — PROTEIN, BODY FLUID (OTHER): Total Protein, Body Fluid Other: 3.1 g/dL

## 2024-03-31 LAB — GLUCOSE, BODY FLUID OTHER: Glucose, Body Fluid Other: 102 mg/dL

## 2024-04-03 LAB — BODY FLUID CULTURE W GRAM STAIN: Culture: NO GROWTH

## 2024-04-10 ENCOUNTER — Ambulatory Visit: Attending: Cardiovascular Disease | Admitting: Internal Medicine

## 2024-04-10 ENCOUNTER — Encounter: Payer: Self-pay | Admitting: Internal Medicine

## 2024-04-10 VITALS — BP 96/80 | HR 90 | Ht 71.0 in | Wt 186.0 lb

## 2024-04-10 DIAGNOSIS — I5022 Chronic systolic (congestive) heart failure: Secondary | ICD-10-CM

## 2024-04-10 DIAGNOSIS — I480 Paroxysmal atrial fibrillation: Secondary | ICD-10-CM

## 2024-04-10 DIAGNOSIS — Z7901 Long term (current) use of anticoagulants: Secondary | ICD-10-CM

## 2024-04-10 DIAGNOSIS — N1832 Chronic kidney disease, stage 3b: Secondary | ICD-10-CM

## 2024-04-10 DIAGNOSIS — Z951 Presence of aortocoronary bypass graft: Secondary | ICD-10-CM

## 2024-04-10 MED ORDER — FUROSEMIDE 40 MG PO TABS: 40.0000 mg | ORAL_TABLET | Freq: Every day | ORAL | 2 refills | Status: DC | PRN

## 2024-04-10 NOTE — Patient Instructions (Signed)
 Medication Instructions:  Your physician has recommended you make the following change in your medication:   1) Take furosemide  (Lasix ) 40 mg as needed for weight gain of 3 lbs overnight or 5 lbs in 1 week, or for worsening swelling to legs/feet *If you need a refill on your cardiac medications before your next appointment, please call your pharmacy*  Follow-Up: At [facility], you and your health needs are our priority.  As part of our continuing mission to provide you with exceptional heart care, our providers are all part of one team.  This team includes your primary Cardiologist (physician) and Advanced Practice Providers or APPs (Physician Assistants and Nurse Practitioners) who all work together to provide you with the care you need, when you need it.  Your next appointment:   2 month(s)  Provider:   Jon Hails, PA-C    We recommend signing up for the patient portal called MyChart.  Sign up information is provided on this After Visit Summary.  MyChart is used to connect with patients for Virtual Visits (Telemedicine).  Patients are able to view lab/test results, encounter notes, upcoming appointments, etc.  Non-urgent messages can be sent to your provider as well.   To learn more about what you can do with MyChart, go to [email or web address].

## 2024-04-10 NOTE — Progress Notes (Unsigned)
 OFFICE NOTE  Chief Complaint:  Follow-up  Primary Care Physician: Shepperson, Kirstin, PA-C  HPI:  Patrick Giles is a 89 year old gentleman, previously followed by Dr. Morgan, with history of CABG in 1998 with a LIMA to LAD, saphenous vein graft to PDA, and vein graft to first diagonal. In 2006, he had a stress test, which showed an EF of about 43% to 44% with a fixed inferoapical defect. That was again present in 2010, suggesting he may have lost a vein graft, possibly to the PDA, and has a fixed defect which looks like scar. The recent, repeat echo in 2014 showed an EF of 45-50% with inferolateral hypokinesis. He is able to walk without any shortness of breath or worsening symptoms. He is on an ACE inhibitor and a B-blocker was added due to CAD and HTN.  He has tolerated this well.  He recently had a lipid profile showing total cholesterol 161, HDL 51, triglycerides 126 and LDL 96. Fasting glucose was 119. Creatinine is stable at 1.5.  12/09/2015  Patrick Giles returns today for follow-up. In November he was noted to have some blood in the urine and underwent transurethral resection of the prostate. Subsequent to that he was found to have DVT and this may or may not be related to his surgery. He was placed on Eliquis  which she continues to take. This is followed by his primary care provider. He denies any worsening chest pain or shortness of breath.   01/18/2017  Patrick Giles returns today for follow-up. Overall he feels well denies any chest pain or worsening shortness of breath. When we last saw him he had repeat echo which showed EF around 45%. He is on appropriate medical therapy. Weight is stable if not decreased somewhat. He remains physically active and plays golf a couple times a week. 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 He is originally from the [place] area of [place]. When we last saw him his echo  demonstrated a dilated aortic root up to about 4.9 cm. He had CT angiogram of the aorta which reevaluated that. He's overdue for follow-up on that aneurysm. He's not had any blood work recently either.  01/18/2018  Patrick Giles was seen today in follow-up.  He is generally without complaints.  He denies chest pain or worsening shortness of breath.  Is been a year since I last saw him.  We are following him for his coronary disease as well as aortopathy.  Last year his CT scan showed a stable aortic root size of 4.9 cm.  It is not yet reach significance for surgery.  We will plan a repeat CT to evaluate this.  I also increase his Lipitor  to reach goal LDL less than 70 and he will need repeat lab work for that.  LVEF was around 45%.  He continues to be active and plays golf several times a week.  05/20/2019  Patrick Giles is seen today in follow-up.  Unfortunately, he suffered a stroke in May 19, affecting the left posterior frontal periventricular and subcortical white matter.  Fortunately he had resolution of some of his symptoms and his made a good recovery.  The etiology of his stroke is not clear.  He did have an echocardiogram which was considered a poor study but mentioned that he had grossly normal LV function.  This would represent an improvement in LVEF from 40 to 45% in the  past.  Fortunately has had no more neurologic deficits.  He remains asymptomatic with no chest pain or shortness of breath.  Blood pressures well controlled today.  EKG shows sinus rhythm with some PACs at 86.  12/16/2021  Patrick Giles is seen today in follow-up.  He denies any chest pain or worsening shortness of breath.  Blood pressure is well controlled.  He has been on Eliquis  since his stroke and was found to have some atrial fibrillation.  He is in A-fib today at a rate of 68 with a right bundle branch block.  He did have recent repeat imaging of his aortic aneurysm which appears to be stable at 4.3 cm.  04/10/2024  Patrick Giles is seen  today in follow-up.  Recently however he has been declining somewhat.  His daughter notes that he has been sleeping a lot more.  He has some visible shortness of breath but denies dyspnea.  Mostly this is noted when exerting himself.  He generally sits in a chair a lot more.  He has had more weight loss, more than 10 pounds since he was last seen.  His amlodipine  was discontinued because of hypotension his blood pressure remains still in the low normal side.  He is on Eliquis  which is concerning because he has had 9 falls recently.  Some of these involved hitting his head.  He has not recently been in A-fib.  He remains on low-dose metoprolol .  He does have a history of some systolic dysfunction on echo however his recent echo was of poor quality and was not able to be interpreted but suggested mild systolic dysfunction.  He also has some chronic kidney disease with a creatinine of around 2.0  PMHx:  Past Medical History:  Diagnosis Date   CAD (coronary artery disease)    DVT, lower extremity (HCC) 07/23/2014   Dyslipidemia    Hypertension    NSVT (nonsustained ventricular tachycardia) (HCC) 01/14/2024   S/P CABG (coronary artery bypass graft) 1998   LIMA to LAD, VG to PDA, VG to first diagonal   Stroke [facility])     Past Surgical History:  Procedure Laterality Date   CHOLECYSTECTOMY  1998   CORONARY ARTERY BYPASS GRAFT  07/08/1997   LIMA to LAD, reverse SVG to PDA, reverse SVG to first diagonal (Dr. CHARLENA Jude)   TRANSTHORACIC ECHOCARDIOGRAM  11/29/2012   EF 45-50%, grade 1 diastolic dysfunction    FAMHx:  Family History  Problem Relation Age of Onset   Emphysema Mother    Colon cancer Father    Colon polyps Father    Lung cancer Brother    Diabetes Neg Hx    Kidney disease Neg Hx    Esophageal cancer Neg Hx     SOCHx:   reports that he quit smoking about 35 years ago. His smoking use included cigars. He has never used smokeless tobacco. He reports that he does not drink alcohol and  does not use drugs.  ALLERGIES:  Allergies  Allergen Reactions   Hydrocodone  Other (See Comments)    Caused severe confusion and disorientation    ROS: Pertinent items noted in HPI and remainder of comprehensive ROS otherwise negative.  HOME MEDS: Current Outpatient Medications  Medication Sig Dispense Refill   apixaban  (ELIQUIS ) 2.5 MG TABS tablet TAKE 1 TABLET BY MOUTH TWICE A DAY (Patient taking differently: Take 2.5 mg by mouth 2 (two) times daily.) 60 tablet 5   atorvastatin  (LIPITOR ) 40 MG tablet TAKE 1 TABLET BY MOUTH  EVERY DAY AT 6PM (Patient taking differently: Take 40 mg by mouth at bedtime.) 90 tablet 3   Garlic 1000 MG CAPS Take 1,000 mg by mouth daily.     metoprolol  succinate (TOPROL -XL) 25 MG 24 hr tablet Take 25 mg by mouth at bedtime.     Multiple Vitamins-Minerals (ALIVE MENS ENERGY PO) Take 1 tablet by mouth daily with breakfast.     Omega-3 Fatty Acids (FISH OIL) 1000 MG CAPS Take 1,000 mg by mouth in the morning.     HYDROcodone -acetaminophen  (NORCO/VICODIN) 5-325 MG tablet Take 1 tablet by mouth every 4 (four) hours as needed. (Patient not taking: Reported on 04/10/2024) 10 tablet 0   No current facility-administered medications for this visit.    LABS/IMAGING: No results found for this or any previous visit (from the past 48 hours). No results found.  VITALS: BP 96/80   Pulse 90   Ht 5' 11 (1.803 m)   Wt 186 lb (84.4 kg)   SpO2 97%   BMI 25.94 kg/m   EXAM: General appearance: alert and no distress Neck: no carotid bruit and no JVD Lungs: clear to auscultation bilaterally Heart: regular rate and rhythm, S1, S2 normal, no murmur, click, rub or gallop Abdomen: soft, non-tender; bowel sounds normal; no masses,  no organomegaly Extremities: extremities normal, atraumatic, no cyanosis or edema Pulses: 2+ and symmetric Skin: Skin color, texture, turgor normal. 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   Mr. Ishmael is likely in sinus rhythm today with a regular rhythm on exam however has had recent decline.  He is eating less and has had weight loss.  He is less active in general he is reported to sit in a chair mostly during the day but gets short of breath with minimal exertion.  Echo showed probable mild LV dysfunction which is consistent with his last echo.  I am concerned about his frequent falls and continuing Eliquis  but he wishes to continue it for the time being.  He understands there is a higher potential bleeding risk.  He does not appear overtly volume overloaded on exam today however does have some mild lower extremity edema.  I will provide some Lasix  to use as needed.  He did have decreased breath sounds to the mid left lung however he does have an elevated left hemidiaphragm which is marked and that explains why there are no breath sounds.  Plan follow-up with us  in 2 to 3 months.  Vinie KYM Maxcy, MD, [facility], FNLA, FACP    [facility]  Medical Director of the [facility]  [facility] Diplomate of the American Board of Clinical Lipidology Attending Cardiologist  Direct Dial: [phone]  Fax: [phone]  Website:  [email or web address] Vinie BROCKS Cayleigh Paull 04/10/2024, 4:09 PM

## 2024-04-14 ENCOUNTER — Encounter (HOSPITAL_COMMUNITY): Payer: Self-pay

## 2024-04-14 ENCOUNTER — Emergency Department (HOSPITAL_COMMUNITY)

## 2024-04-14 ENCOUNTER — Emergency Department (HOSPITAL_COMMUNITY)
Admission: EM | Admit: 2024-04-14 | Discharge: 2024-04-14 | Disposition: A | Discharge status: Home / Self Care | Attending: Emergency Medicine | Admitting: Emergency Medicine

## 2024-04-14 ENCOUNTER — Other Ambulatory Visit: Payer: Self-pay

## 2024-04-14 DIAGNOSIS — Y92009 Unspecified place in unspecified non-institutional (private) residence as the place of occurrence of the external cause: Secondary | ICD-10-CM | POA: Diagnosis not present

## 2024-04-14 DIAGNOSIS — I509 Heart failure, unspecified: Secondary | ICD-10-CM | POA: Diagnosis not present

## 2024-04-14 DIAGNOSIS — I110 Hypertensive heart disease with heart failure: Secondary | ICD-10-CM | POA: Insufficient documentation

## 2024-04-14 DIAGNOSIS — I6782 Cerebral ischemia: Secondary | ICD-10-CM | POA: Diagnosis not present

## 2024-04-14 DIAGNOSIS — Z8673 Personal history of transient ischemic attack (TIA), and cerebral infarction without residual deficits: Secondary | ICD-10-CM | POA: Diagnosis not present

## 2024-04-14 DIAGNOSIS — Z7901 Long term (current) use of anticoagulants: Secondary | ICD-10-CM | POA: Diagnosis not present

## 2024-04-14 DIAGNOSIS — W19XXXA Unspecified fall, initial encounter: Secondary | ICD-10-CM | POA: Insufficient documentation

## 2024-04-14 DIAGNOSIS — S51011A Laceration without foreign body of right elbow, initial encounter: Secondary | ICD-10-CM | POA: Insufficient documentation

## 2024-04-14 DIAGNOSIS — I2510 Atherosclerotic heart disease of native coronary artery without angina pectoris: Secondary | ICD-10-CM | POA: Insufficient documentation

## 2024-04-14 DIAGNOSIS — S0990XA Unspecified injury of head, initial encounter: Secondary | ICD-10-CM | POA: Diagnosis not present

## 2024-04-14 MED ORDER — SODIUM CHLORIDE 0.9 % IV BOLUS: 1000.0000 mL | Freq: Once | INTRAVENOUS | Status: DC

## 2024-04-14 MED ORDER — ASPIRIN 81 MG PO CHEW: 324.0000 mg | CHEWABLE_TABLET | Freq: Once | ORAL | Status: DC

## 2024-04-14 NOTE — ED Notes (Signed)
 This RN reviewed discharge instructions with patient. he verbalized understanding and denied any further questions. PT well appearing upon discharge and reports BO pain. Pt wheeled out to lobby with family/friend is awaiting ride.

## 2024-04-14 NOTE — Discharge Instructions (Addendum)
 Please discuss with his medical providers the pros and cons of all of the medications that he is taking.  I am specifically concerned about apixaban  (Eliquis ) which carries risk of bleeding when he has falls, and furosemide  which puts him at risk for falls because of having to frequently go to the bathroom.

## 2024-04-14 NOTE — ED Provider Notes (Signed)
 [place] EMERGENCY DEPARTMENT AT [facility] Provider Note   CSN: [id number] Arrival date & time: 04/14/24  0425     Patient presents with: No chief complaint on file.   Patrick Giles is a 89 y.o. male.   The history is provided by the patient and the EMS personnel.   He has history of hypertension, hyperlipidemia, stroke, coronary disease, nonsustained ventricular tachycardia, heart failure, atrial fibrillation anticoagulated on apixaban  and comes in following a fall at home.  He is not sure how he fell.  He did suffer some skin tear to his right elbow, but is not complaining of any pain.    Prior to Admission medications   Medication Sig Start Date End Date Taking? Authorizing Provider  apixaban  (ELIQUIS ) 2.5 MG TABS tablet TAKE 1 TABLET BY MOUTH TWICE A DAY Patient taking differently: Take 2.5 mg by mouth 2 (two) times daily. 01/06/22   Hilty, Vinie BROCKS, MD  atorvastatin  (LIPITOR ) 40 MG tablet TAKE 1 TABLET BY MOUTH EVERY DAY AT 6PM Patient taking differently: Take 40 mg by mouth at bedtime. 02/19/18   Hilty, Vinie BROCKS, MD  furosemide  (LASIX ) 40 MG tablet Take 1 tablet (40 mg total) by mouth daily as needed (for weight gain of 3 lbs overnight or 5 lbs in 1 week, or for worsening swelling to legs/feet). 04/10/24   Mona Vinie BROCKS, MD  Garlic 1000 MG CAPS Take 1,000 mg by mouth daily.    [provider]  HYDROcodone -acetaminophen  (NORCO/VICODIN) 5-325 MG tablet Take 1 tablet by mouth every 4 (four) hours as needed. Patient not taking: Reported on 04/10/2024 03/29/24   Elnor Savant A, DO  metoprolol  succinate (TOPROL -XL) 25 MG 24 hr tablet Take 25 mg by mouth at bedtime.    [provider]  Multiple Vitamins-Minerals (ALIVE MENS ENERGY PO) Take 1 tablet by mouth daily with breakfast.    [provider]  Omega-3 Fatty Acids (FISH OIL) 1000 MG CAPS Take 1,000 mg by mouth in the morning.    [provider]    Allergies: Hydrocodone     Review  of Systems  All other systems reviewed and are negative.   Updated Vital Signs BP (!) 144/76   Pulse 76   Temp 98.1 F (36.7 C)   Resp 18   Ht 5' 11 (1.803 m)   Wt 84.4 kg   SpO2 100%   BMI 25.94 kg/m   Physical Exam Vitals and nursing note reviewed.   89 year old male, resting comfortably and in no acute distress. Vital signs are normal. Oxygen saturation is 100%, which is normal. Head is normocephalic and atraumatic. PERRLA, EOMI. Neck is nontender. Back is nontender. Lungs are clear without rales, wheezes, or rhonchi. Chest is nontender. Heart has regular rate and rhythm without murmur. Abdomen is soft, flat, nontender. Extremities: Skin tear present right elbow, no swelling or deformity.  Full passive range of motion present all joints without pain. Skin is warm and dry without rash. Neurologic: Awake and alert, conversant, moves all extremities equally.  Radiology: DG Elbow 2 Views Right Result Date: 04/14/2024 EXAM: 4 VIEW(S) XRAY OF THE RIGHT ELBOW COMPARISON: None available. CLINICAL HISTORY: Fall. Reason for exam: level 2 trauma, fall on blood thinners. FINDINGS: BONES AND JOINTS: No acute fracture. No focal osseous lesion. No joint dislocation. SOFT TISSUES: Soft tissue swelling is present posterior to the elbow. IMPRESSION: 1. No acute fracture. 2. Soft tissue swelling posterior to the elbow. Electronically signed by: Lonni Necessary MD  04/14/2024 06:41 AM EDT RP Workstation: [id number]   CT Head Wo Contrast Result Date: 04/14/2024 EXAM: CT HEAD WITHOUT CONTRAST 04/14/2024 05:02:06 AM TECHNIQUE: CT of the head was performed without the administration of intravenous contrast. Automated exposure control, iterative reconstruction, and/or weight based adjustment of the mA/kV was utilized to reduce the radiation dose to as low as reasonably achievable. COMPARISON: CT head without contrast 02/28/2024. CLINICAL HISTORY: Head trauma, minor (Age >= 65y). No contrast. Patient  coming from home. Patient was standing up from recliner and became unsteady on his feet then fell. Patient is unsure about LOC or hitting his head. Patient is taking Eliquis . Patient is presenting with a skin tear on his right elbow and bruising to his right knee. FINDINGS: BRAIN AND VENTRICLES: No acute hemorrhage. Gray-white differentiation is preserved. No hydrocephalus. No extra-axial collection. No mass effect or midline shift. Remote white matter ischemia is present in the left external capsule and corona radiata. Remote lacunar infarcts of the basal ganglia bilaterally are stable. ORBITS: No acute abnormality. SINUSES: No acute abnormality. SOFT TISSUES AND SKULL: No acute soft tissue abnormality. No skull fracture. Right middle ear and mastoid fluid versus soft tissue is again noted. Fluid or soft tissue fills the right external auditory canal. Scutum is eroded. VASCULATURE: Atherosclerotic calcifications are present in the cavernous carotid arteries bilaterally and at the dural margin of both vertebral arteries. No hyperdense vessel is present. IMPRESSION: 1. No acute intracranial abnormality related to the reported head trauma. 2. Stable remote white matter ischemia in the left external capsule and corona radiata, and stable remote lacunar infarcts of the basal ganglia bilaterally. 3. Atherosclerotic calcifications in the cavernous carotid arteries bilaterally and at the dural margin of both vertebral arteries. No hyperdense vessel is present. 4. Right middle ear and mastoid fluid versus soft tissue, with erosion of the scutum and fluid or soft tissue filling the right external auditory canal. Findings are concerning for an acquired cholesteatoma. Recommend nonemergent ENT consultation. Electronically signed by: Lonni Necessary MD 04/14/2024 05:20 AM EDT RP Workstation: [id number]   CT Cervical Spine Wo Contrast Result Date: 04/14/2024 EXAM: CT CERVICAL SPINE WITHOUT CONTRAST 04/14/2024 05:02:06 AM  TECHNIQUE: CT of the cervical spine was performed without the administration of intravenous contrast. Multiplanar reformatted images are provided for review. Automated exposure control, iterative reconstruction, and/or weight based adjustment of the mA/kV was utilized to reduce the radiation dose to as low as reasonably achievable. COMPARISON: CT of the cervical spine 03/29/2024. CLINICAL HISTORY: Neck trauma (Age >= 65y). No contrast. Patient coming from home. Patient was standing up from recliner and became unsteady on his feet then fell. Patient is unsure about LOC or hitting his head. Patient is taking Eliquis . Patient is presenting with a skin tear on his right elbow and bruising to his right knee. FINDINGS: CERVICAL SPINE: BONES AND ALIGNMENT: Slight reversal of the normal cervical lordosis is stable. DEGENERATIVE CHANGES: Chronic loss of disc height and endplate degenerative change at C4-5, C5-6 and C6-7 is stable. SOFT TISSUES: No prevertebral soft tissue swelling. VASCULATURE: Atherosclerotic changes are present at the carotid bifurcations bilaterally, the cavernous internal carotid arteries and epidural margin of the left vertebral artery. IMPRESSION: 1. No acute abnormality of the cervical spine related to the reported neck trauma. 2. Stable slight reversal of the normal cervical lordosis and chronic degenerative changes at C4-5, C5-6, and C6-7. Electronically signed by: Lonni Necessary MD 04/14/2024 05:15 AM EDT RP Workstation: [id number]     Procedures   Medications Ordered  in the ED - No data to display                                  Medical Decision Making Amount and/or Complexity of Data Reviewed Radiology: ordered.   Fall at home and patient who is anticoagulated on apixaban .  I have ordered CT of head and cervical spine, plain x-rays of right elbow.  And note prior ED visit for on 03/29/2024 for fall.  If falls start becoming frequent, he may need to reconsider the risks of  anticoagulation.  CT scan and x-rays show no acute process.  There is some suggestion of a cholesteatoma of his left ear, but his stepson states that he is already under the care of an ENT physician regarding that.  I have independently viewed all of the images, and agree with the radiologist's interpretation.  I had an extensive discussion with his son-in-law about the risks and benefits of various medications including diuretics and anticoagulants.  I have referred him back to his cardiologist to discuss whether these should be continued or modified.  CRITICAL CARE Performed by: Alm Lias Total critical care time: 45 minutes Critical care time was exclusive of separately billable procedures and treating other patients. Critical care was necessary to treat or prevent imminent or life-threatening deterioration. Critical care was time spent personally by me on the following activities: development of treatment plan with patient and/or surrogate as well as nursing, discussions with consultants, evaluation of patient's response to treatment, examination of patient, obtaining history from patient or surrogate, ordering and performing treatments and interventions, ordering and review of laboratory studies, ordering and review of radiographic studies, pulse oximetry and re-evaluation of patient's condition.     Final diagnoses:  Fall at home, initial encounter  Skin tear of right elbow without complication, initial encounter  Chronic anticoagulation    ED Discharge Orders     None          Lias Alm, MD 04/14/24 [phone]

## 2024-04-14 NOTE — ED Triage Notes (Signed)
 Patient coming from home. Patient was standing up from recliner and became unsteady on his feet then fell. Patient is unsure about LOC or hitting his head. Patient is taking Eliquis . Patient is presenting with a skin tear on his right elbow and bruising to his right knee. EMS VS 124 SBP 84 HR 95% RA 133 CBG

## 2024-04-14 NOTE — ED Notes (Signed)
 Trauma Response Nurse Documentation   Patrick Giles is a 89 y.o. male arriving to [facility] ED via EMS  On Eliquis  (apixaban ) daily. Trauma was activated as a Level 2 by ED charge RN based on the following trauma criteria Elderly patients > 65 with head trauma on anti-coagulation (excluding ASA).  Patient cleared for CT by Dr. Raford EDP. Pt transported to CT with trauma response nurse present to monitor. RN remained with the patient throughout their absence from the department for clinical observation.   GCS 15.   History   Past Medical History:  Diagnosis Date   CAD (coronary artery disease)    DVT, lower extremity (HCC) 07/23/2014   Dyslipidemia    Hypertension    NSVT (nonsustained ventricular tachycardia) (HCC) 01/14/2024   S/P CABG (coronary artery bypass graft) 1998   LIMA to LAD, VG to PDA, VG to first diagonal   Stroke [facility])      Past Surgical History:  Procedure Laterality Date   CHOLECYSTECTOMY  1998   CORONARY ARTERY BYPASS GRAFT  07/08/1997   LIMA to LAD, reverse SVG to PDA, reverse SVG to first diagonal (Dr. CHARLENA Jude)   TRANSTHORACIC ECHOCARDIOGRAM  11/29/2012   EF 45-50%, grade 1 diastolic dysfunction       Initial Focused Assessment (If applicable, or please see trauma documentation): Alert/oriented male presents via EMS from home after a fall from standing. On eliquis . Skin tear to left elbow, bruising R lateral knee, bruising at various stages of healing.  Airway patent, BS clear No obvious uncontrolled hemorrhage GCS 15  CT's Completed:   CT Head and CT C-Spine   Interventions:  Right elbow XRAY CT head and cervical spine  Plan for disposition:  Discharge home   Consults completed:  none   Event Summary: Fall from standing position at home, hx mutlitple falls. On eliquis , recently added lasix . Skin tear R elbow, bruising R lateral knee. Anticipate D/C home, trauma scans without acute injury.     Bedside handoff with ED RN Janeth.     Patrick Giles  Trauma Response RN  Please call TRN at [phone] for further assistance.

## 2024-04-15 ENCOUNTER — Telehealth: Payer: Self-pay | Admitting: Internal Medicine

## 2024-04-15 ENCOUNTER — Encounter: Payer: Self-pay | Admitting: Internal Medicine

## 2024-04-15 NOTE — Telephone Encounter (Signed)
 Patient c/o Palpitations:  STAT if patient reporting lightheadedness, shortness of breath, or chest pain  How long have you had palpitations/irregular HR/ Afib? Daughter states he has been in A-fib since yesterday when he was in the ED. Are you having the symptoms now? yes  Are you currently experiencing lightheadedness, SOB or CP? no  Do you have a history of afib (atrial fibrillation) or irregular heart rhythm? yes  Have you checked your BP or HR? (document readings if available): no  Are you experiencing any other symptoms?  Daughter states he is very weak, and she feels he needs to be admitted.  She states when he was in the ED yesterday he had A-fib and they still sent him home. He is very confused. They can't manage his care at home and feels he needs to be in the hospital.

## 2024-04-15 NOTE — Telephone Encounter (Signed)
 Spoke with the patient's daughter who is concerned about the patient's declining health. The patient fell twice over the weekend and ended up in the ER. They sent him home but feel as if he needs to be admitted. She states that he has been taking lasix  as needed since his visit with Dr. Mona last week for edema. She states that the lasix  is increasing his fall risk so she has stopped giving it to him. She is concerned about his heart failure though and him retaining fluid. She is also concerned about his afib not being controlled. She reports that he is weak and becoming more confused. He is also having more shortness of breath. She states that they are going to have EMS transport him back to the hospital.

## 2024-04-16 NOTE — Telephone Encounter (Signed)
 Spoke to patient's daughter Nena.She stated father is unable to take lasix .Stated she is presently on vacation and her sister is helping her father. He has increased swelling in both lower legs.Stated he fell 3 times over the weekend.He was taken to [facility] ED.Stated hospice was consulted and they will be coming out to see her father tomorrow.Stated her father is too weak to get in car to come for appointment.Stated she will call back after he is seen by Hospice.I will make Dr.Hilty aware.

## 2024-04-16 NOTE — Telephone Encounter (Signed)
 Spoke to patient's daughter Richardson to get her father scheduled for a 2 month f/u that Dr. Mona had requested. She states that he needs to be seen sooner. She says that her sister, Particia, called in yesterday and was waiting to hear back from the nurse. I'm adding on to this encounter because it looks like [place] spoke with Carly yesterday, however, Richardson stated that her sister was waiting to hear back from the nurse. They are requesting someone call Particia to discuss her fathers condition -  [phone]

## 2024-04-17 ENCOUNTER — Ambulatory Visit: Admitting: Cardiovascular Disease

## 2024-04-17 NOTE — Progress Notes (Deleted)
 No chief complaint on file.  History of Present Illness: 89 yo male with history of CAD s/p 3V CABG in 1998, ischemic cardiomyopathy, thoracic aortic aneurysm, CKD, paroxysmal atrial fibrillation, RBBB, CVA, NSVT, HTN and HLD who is here today for follow up. He is followed in our office by Dr. Mona. He was seen on 04/10/24 by Dr. Mona and c/o fatigue, dyspnea and weight loss. Echo April 2025 with poor LV opacification but likely mild LV dysfunction which is chronic. No valve disease. Chest CTA   Primary Care Physician: Donata Snowman, PA-C   Past Medical History:  Diagnosis Date   CAD (coronary artery disease)    DVT, lower extremity (HCC) 07/23/2014   Dyslipidemia    Hypertension    NSVT (nonsustained ventricular tachycardia) (HCC) 01/14/2024   S/P CABG (coronary artery bypass graft) 1998   LIMA to LAD, VG to PDA, VG to first diagonal   Stroke [facility])     Past Surgical History:  Procedure Laterality Date   CHOLECYSTECTOMY  1998   CORONARY ARTERY BYPASS GRAFT  07/08/1997   LIMA to LAD, reverse SVG to PDA, reverse SVG to first diagonal (Dr. CHARLENA Jude)   TRANSTHORACIC ECHOCARDIOGRAM  11/29/2012   EF 45-50%, grade 1 diastolic dysfunction    Current Outpatient Medications  Medication Sig Dispense Refill   apixaban  (ELIQUIS ) 2.5 MG TABS tablet TAKE 1 TABLET BY MOUTH TWICE A DAY (Patient taking differently: Take 2.5 mg by mouth 2 (two) times daily.) 60 tablet 5   atorvastatin  (LIPITOR ) 40 MG tablet TAKE 1 TABLET BY MOUTH EVERY DAY AT 6PM (Patient taking differently: Take 40 mg by mouth at bedtime.) 90 tablet 3   furosemide  (LASIX ) 40 MG tablet Take 1 tablet (40 mg total) by mouth daily as needed (for weight gain of 3 lbs overnight or 5 lbs in 1 week, or for worsening swelling to legs/feet). 30 tablet 2   Garlic 1000 MG CAPS Take 1,000 mg by mouth daily.     HYDROcodone -acetaminophen  (NORCO/VICODIN) 5-325 MG tablet Take 1 tablet by mouth every 4 (four) hours as needed. (Patient  not taking: Reported on 04/10/2024) 10 tablet 0   metoprolol  succinate (TOPROL -XL) 25 MG 24 hr tablet Take 25 mg by mouth at bedtime.     Multiple Vitamins-Minerals (ALIVE MENS ENERGY PO) Take 1 tablet by mouth daily with breakfast.     Omega-3 Fatty Acids (FISH OIL) 1000 MG CAPS Take 1,000 mg by mouth in the morning.     No current facility-administered medications for this visit.    Allergies  Allergen Reactions   Hydrocodone  Other (See Comments)    Caused severe confusion and disorientation    Social History   Socioeconomic History   Marital status: Widowed    Spouse name: Not on file   Number of children: 5   Years of education: 29   Highest education level: Not on file  Occupational History   Occupation: Retired  Tobacco Use   Smoking status: Former    Types: Cigars    Quit date: 09/19/1988    Years since quitting: 35.6   Smokeless tobacco: Never  Vaping Use   Vaping status: Never Used  Substance and Sexual Activity   Alcohol use: No    Alcohol/week: 0.0 standard drinks of alcohol   Drug use: No   Sexual activity: Not on file  Other Topics Concern   Not on file  Social History Narrative   Not on file   Social Drivers of Health  Financial Resource Strain: Low Risk  (03/19/2024)   Received from [facility]   Overall Financial Resource Strain (CARDIA)    Difficulty of Paying Living Expenses: Not hard at all  Food Insecurity: No Food Insecurity (03/19/2024)   Received from [facility]   Hunger Vital Sign    Within the past 12 months, you worried that your food would run out before you got the money to buy more.: Never true    Within the past 12 months, the food you bought just didn't last and you didn't have money to get more.: Never true  Transportation Needs: No Transportation Needs (03/19/2024)   Received from [facility] - Transportation    Lack of Transportation (Medical): No    Lack of Transportation (Non-Medical): No  Physical Activity:  Insufficiently Active (03/19/2024)   Received from [facility]   Exercise Vital Sign    On average, how many days per week do you engage in moderate to strenuous exercise (like a brisk walk)?: 7 days    On average, how many minutes do you engage in exercise at this level?: 10 min  Stress: No Stress Concern Present (03/19/2024)   Received from [facility] - Occupational Stress Questionnaire    Feeling of Stress : Not at all  Social Connections: Moderately Integrated (03/19/2024)   Received from [facility]   Social Network    How would you rate your social network (family, work, friends)?: Adequate participation with social networks  Recent Concern: Social Connections - Socially Isolated (01/15/2024)   Social Connection and Isolation Panel    Frequency of Communication with Friends and Family: More than three times a week    Frequency of Social Gatherings with Friends and Family: More than three times a week    Attends Religious Services: Never    Database administrator or Organizations: No    Attends Banker Meetings: Never    Marital Status: Widowed  Intimate Partner Violence: Not At Risk (03/19/2024)   Received from [facility]   HITS    Over the last 12 months how often did your partner physically hurt you?: Never    Over the last 12 months how often did your partner insult you or talk down to you?: Never    Over the last 12 months how often did your partner threaten you with physical harm?: Never    Over the last 12 months how often did your partner scream or curse at you?: Never    Family History  Problem Relation Age of Onset   Emphysema Mother    Colon cancer Father    Colon polyps Father    Lung cancer Brother    Diabetes Neg Hx    Kidney disease Neg Hx    Esophageal cancer Neg Hx     Review of Systems:  As stated in the HPI and otherwise negative.   There were no vitals taken for this visit.  Physical  Examination: General: Well developed, well nourished, NAD  HEENT: OP clear, mucus membranes moist  SKIN: warm, dry. No rashes. Neuro: No focal deficits  Musculoskeletal: Muscle strength 5/5 all ext  Psychiatric: Mood and affect normal  Neck: No JVD, no carotid bruits, no thyromegaly, no lymphadenopathy.  Lungs:Clear bilaterally, no wheezes, rhonci, crackles Cardiovascular: Regular rate and rhythm. No murmurs, gallops or rubs. Abdomen:Soft. Bowel sounds present. Non-tender.  Extremities: No lower extremity edema. Pulses are 2 + in  the bilateral DP/PT.  EKG:  EKG {ACTION; IS/IS [id number]:[id number]} ordered today. The ekg ordered today demonstrates ***  Recent Labs: 01/15/2024: ALT 14 03/30/2024: BUN 22; Creatinine, Ser 2.07; Hemoglobin 13.6; Magnesium  1.7; Platelets 155; Potassium 4.0; Sodium 136   Lipid Panel    Component Value Date/Time   CHOL 135 06/19/2018 0437   CHOL 147 01/18/2018 0932   TRIG 115 06/19/2018 0437   HDL 44 06/19/2018 0437   HDL 54 01/18/2018 0932   CHOLHDL 3.1 06/19/2018 0437   VLDL 23 06/19/2018 0437   LDLCALC 68 06/19/2018 0437   LDLCALC 63 01/18/2018 0932     Wt Readings from Last 3 Encounters:  04/14/24 186 lb (84.4 kg)  04/10/24 186 lb (84.4 kg)  03/30/24 185 lb 3 oz (84 kg)      Assessment and Plan:   1.   Labs/ tests ordered today include:  No orders of the defined types were placed in this encounter.    Disposition:   F/U with me in ***    Signed, Lonni Cash, MD, [facility] 04/17/2024 10:47 AM    [facility] [address] [place], [place], [place]  [address] Phone: [phone]; Fax: [phone]

## 2024-04-18 ENCOUNTER — Encounter: Payer: Self-pay | Admitting: Cardiovascular Disease

## 2024-07-06 ENCOUNTER — Other Ambulatory Visit: Payer: Self-pay | Admitting: Internal Medicine

## 2024-07-20 DEATH — deceased
